# Patient Record
Sex: Female | Born: 1960
Health system: Southern US, Community
[De-identification: ages and names within clinical notes are randomized; demographics above are authoritative.]

## PROBLEM LIST (undated history)

## (undated) DIAGNOSIS — E785 Hyperlipidemia, unspecified: Secondary | ICD-10-CM

## (undated) DIAGNOSIS — D649 Anemia, unspecified: Secondary | ICD-10-CM

## (undated) DIAGNOSIS — N200 Calculus of kidney: Secondary | ICD-10-CM

## (undated) DIAGNOSIS — I10 Essential (primary) hypertension: Secondary | ICD-10-CM

## (undated) DIAGNOSIS — K219 Gastro-esophageal reflux disease without esophagitis: Secondary | ICD-10-CM

## (undated) DIAGNOSIS — J45909 Unspecified asthma, uncomplicated: Secondary | ICD-10-CM

## (undated) HISTORY — PX: TONSILLECTOMY: SUR1361

## (undated) HISTORY — PX: ECTOPIC PREGNANCY SURGERY: SHX613

## (undated) HISTORY — PX: TUBAL LIGATION: SHX77

## (undated) HISTORY — DX: Essential (primary) hypertension: I10

---

## 1997-09-14 ENCOUNTER — Encounter: Admission: RE | Admit: 1997-09-14 | Discharge: 1997-09-14 | Payer: Self-pay | Admitting: Internal Medicine

## 1997-09-20 ENCOUNTER — Encounter: Admission: RE | Admit: 1997-09-20 | Discharge: 1997-12-19 | Payer: Self-pay | Admitting: *Deleted

## 1997-12-16 ENCOUNTER — Encounter: Admission: RE | Admit: 1997-12-16 | Discharge: 1997-12-16 | Payer: Self-pay | Admitting: Internal Medicine

## 1998-03-09 ENCOUNTER — Encounter: Admission: RE | Admit: 1998-03-09 | Discharge: 1998-03-09 | Payer: Self-pay | Admitting: Internal Medicine

## 1998-04-13 ENCOUNTER — Encounter: Admission: RE | Admit: 1998-04-13 | Discharge: 1998-04-13 | Payer: Self-pay | Admitting: Hematology and Oncology

## 1998-11-10 ENCOUNTER — Other Ambulatory Visit: Admission: RE | Admit: 1998-11-10 | Discharge: 1998-11-10 | Payer: Self-pay | Admitting: *Deleted

## 1998-12-05 ENCOUNTER — Emergency Department (HOSPITAL_COMMUNITY): Admission: EM | Admit: 1998-12-05 | Discharge: 1998-12-05 | Payer: Self-pay | Admitting: Emergency Medicine

## 1999-04-30 ENCOUNTER — Encounter: Admission: RE | Admit: 1999-04-30 | Discharge: 1999-04-30 | Payer: Self-pay | Admitting: Hematology and Oncology

## 1999-07-17 ENCOUNTER — Other Ambulatory Visit: Admission: RE | Admit: 1999-07-17 | Discharge: 1999-07-17 | Payer: Self-pay | Admitting: *Deleted

## 1999-11-13 ENCOUNTER — Other Ambulatory Visit: Admission: RE | Admit: 1999-11-13 | Discharge: 1999-11-13 | Payer: Self-pay | Admitting: *Deleted

## 2000-01-04 ENCOUNTER — Encounter: Admission: RE | Admit: 2000-01-04 | Discharge: 2000-01-04 | Payer: Self-pay | Admitting: *Deleted

## 2000-01-04 ENCOUNTER — Encounter: Payer: Self-pay | Admitting: *Deleted

## 2000-11-17 ENCOUNTER — Other Ambulatory Visit: Admission: RE | Admit: 2000-11-17 | Discharge: 2000-11-17 | Payer: Self-pay | Admitting: *Deleted

## 2001-01-05 ENCOUNTER — Encounter: Payer: Self-pay | Admitting: *Deleted

## 2001-01-05 ENCOUNTER — Encounter: Admission: RE | Admit: 2001-01-05 | Discharge: 2001-01-05 | Payer: Self-pay | Admitting: *Deleted

## 2001-09-09 ENCOUNTER — Encounter: Payer: Self-pay | Admitting: Occupational Medicine

## 2001-09-09 ENCOUNTER — Encounter: Admission: RE | Admit: 2001-09-09 | Discharge: 2001-09-09 | Payer: Self-pay | Admitting: Occupational Medicine

## 2001-09-28 ENCOUNTER — Encounter: Admission: RE | Admit: 2001-09-28 | Discharge: 2001-12-27 | Payer: Self-pay | Admitting: Occupational Medicine

## 2002-01-12 ENCOUNTER — Encounter: Admission: RE | Admit: 2002-01-12 | Discharge: 2002-01-12 | Payer: Self-pay | Admitting: Family Medicine

## 2002-01-12 ENCOUNTER — Encounter: Payer: Self-pay | Admitting: Family Medicine

## 2003-01-14 ENCOUNTER — Encounter: Payer: Self-pay | Admitting: Family Medicine

## 2003-01-14 ENCOUNTER — Encounter: Admission: RE | Admit: 2003-01-14 | Discharge: 2003-01-14 | Payer: Self-pay | Admitting: Family Medicine

## 2004-01-16 ENCOUNTER — Encounter: Admission: RE | Admit: 2004-01-16 | Discharge: 2004-01-16 | Payer: Self-pay | Admitting: Obstetrics and Gynecology

## 2005-01-16 ENCOUNTER — Encounter: Admission: RE | Admit: 2005-01-16 | Discharge: 2005-01-16 | Payer: Self-pay | Admitting: Obstetrics and Gynecology

## 2005-08-27 ENCOUNTER — Emergency Department (HOSPITAL_COMMUNITY): Admission: EM | Admit: 2005-08-27 | Discharge: 2005-08-27 | Payer: Self-pay | Admitting: Emergency Medicine

## 2006-01-20 ENCOUNTER — Encounter: Admission: RE | Admit: 2006-01-20 | Discharge: 2006-01-20 | Payer: Self-pay | Admitting: Obstetrics and Gynecology

## 2007-01-22 ENCOUNTER — Encounter: Admission: RE | Admit: 2007-01-22 | Discharge: 2007-01-22 | Payer: Self-pay | Admitting: Family Medicine

## 2007-02-15 ENCOUNTER — Emergency Department (HOSPITAL_COMMUNITY): Admission: EM | Admit: 2007-02-15 | Discharge: 2007-02-15 | Payer: Self-pay | Admitting: Family Medicine

## 2007-05-08 ENCOUNTER — Emergency Department (HOSPITAL_COMMUNITY): Admission: EM | Admit: 2007-05-08 | Discharge: 2007-05-08 | Payer: Self-pay | Admitting: Family Medicine

## 2007-05-08 ENCOUNTER — Ambulatory Visit (HOSPITAL_COMMUNITY): Admission: RE | Admit: 2007-05-08 | Discharge: 2007-05-08 | Payer: Self-pay | Admitting: Emergency Medicine

## 2007-08-01 ENCOUNTER — Emergency Department (HOSPITAL_COMMUNITY): Admission: EM | Admit: 2007-08-01 | Discharge: 2007-08-01 | Payer: Self-pay | Admitting: Family Medicine

## 2007-10-04 ENCOUNTER — Emergency Department (HOSPITAL_COMMUNITY): Admission: EM | Admit: 2007-10-04 | Discharge: 2007-10-04 | Payer: Self-pay | Admitting: Family Medicine

## 2007-11-15 ENCOUNTER — Emergency Department (HOSPITAL_COMMUNITY): Admission: EM | Admit: 2007-11-15 | Discharge: 2007-11-15 | Payer: Self-pay | Admitting: Family Medicine

## 2008-01-25 ENCOUNTER — Encounter: Admission: RE | Admit: 2008-01-25 | Discharge: 2008-01-25 | Payer: Self-pay | Admitting: Family Medicine

## 2008-04-05 ENCOUNTER — Emergency Department (HOSPITAL_COMMUNITY): Admission: EM | Admit: 2008-04-05 | Discharge: 2008-04-05 | Payer: Self-pay | Admitting: Emergency Medicine

## 2008-11-17 ENCOUNTER — Emergency Department (HOSPITAL_COMMUNITY): Admission: EM | Admit: 2008-11-17 | Discharge: 2008-11-17 | Payer: Self-pay | Admitting: Family Medicine

## 2009-01-30 ENCOUNTER — Encounter: Admission: RE | Admit: 2009-01-30 | Discharge: 2009-01-30 | Payer: Self-pay | Admitting: Family Medicine

## 2009-09-02 ENCOUNTER — Emergency Department (HOSPITAL_COMMUNITY): Admission: EM | Admit: 2009-09-02 | Discharge: 2009-09-02 | Payer: Self-pay | Admitting: Family Medicine

## 2010-01-07 ENCOUNTER — Emergency Department (HOSPITAL_COMMUNITY): Admission: EM | Admit: 2010-01-07 | Discharge: 2010-01-07 | Payer: Self-pay | Admitting: Family Medicine

## 2010-03-28 ENCOUNTER — Encounter: Admission: RE | Admit: 2010-03-28 | Discharge: 2010-03-28 | Payer: Self-pay | Admitting: Family Medicine

## 2010-09-17 LAB — POCT I-STAT, CHEM 8
Chloride: 106 mEq/L (ref 96–112)
HCT: 40 % (ref 36.0–46.0)
Hemoglobin: 13.6 g/dL (ref 12.0–15.0)
Potassium: 3.8 mEq/L (ref 3.5–5.1)
Sodium: 142 mEq/L (ref 135–145)

## 2011-03-05 LAB — POCT I-STAT, CHEM 8
BUN: 14
Creatinine, Ser: 0.9
Hemoglobin: 13.6
Sodium: 142

## 2011-03-08 ENCOUNTER — Emergency Department (HOSPITAL_COMMUNITY): Payer: 59

## 2011-03-08 ENCOUNTER — Emergency Department (HOSPITAL_COMMUNITY)
Admission: EM | Admit: 2011-03-08 | Discharge: 2011-03-08 | Disposition: A | Discharge status: Home / Self Care | Payer: 59 | Attending: Emergency Medicine | Admitting: Emergency Medicine

## 2011-03-08 DIAGNOSIS — L03119 Cellulitis of unspecified part of limb: Secondary | ICD-10-CM | POA: Insufficient documentation

## 2011-03-08 DIAGNOSIS — M7989 Other specified soft tissue disorders: Secondary | ICD-10-CM | POA: Insufficient documentation

## 2011-03-08 DIAGNOSIS — L02419 Cutaneous abscess of limb, unspecified: Secondary | ICD-10-CM | POA: Insufficient documentation

## 2011-03-08 DIAGNOSIS — E7800 Pure hypercholesterolemia, unspecified: Secondary | ICD-10-CM | POA: Insufficient documentation

## 2011-03-08 DIAGNOSIS — E119 Type 2 diabetes mellitus without complications: Secondary | ICD-10-CM | POA: Insufficient documentation

## 2011-03-19 LAB — DIFFERENTIAL
Basophils Absolute: 0
Eosinophils Absolute: 0.4
Lymphocytes Relative: 22

## 2011-03-19 LAB — POCT I-STAT CREATININE
Creatinine, Ser: 0.7
Operator id: 126491

## 2011-03-19 LAB — CBC
Hemoglobin: 13.3
MCV: 88
Platelets: 257
WBC: 12 — ABNORMAL HIGH

## 2011-03-19 LAB — I-STAT 8, (EC8 V) (CONVERTED LAB)
Bicarbonate: 23.2
Chloride: 106
Glucose, Bld: 138 — ABNORMAL HIGH
Hemoglobin: 14.6
Operator id: 126491
Potassium: 3.9
Sodium: 141
pCO2, Ven: 40.3 — ABNORMAL LOW
pH, Ven: 7.367 — ABNORMAL HIGH

## 2011-04-16 ENCOUNTER — Other Ambulatory Visit: Payer: Self-pay | Admitting: Family Medicine

## 2011-04-16 DIAGNOSIS — Z1231 Encounter for screening mammogram for malignant neoplasm of breast: Secondary | ICD-10-CM

## 2011-05-03 ENCOUNTER — Ambulatory Visit
Admission: RE | Admit: 2011-05-03 | Discharge: 2011-05-03 | Disposition: A | Discharge status: Home / Self Care | Payer: 59 | Source: Ambulatory Visit | Attending: Family Medicine | Admitting: Family Medicine

## 2011-05-03 DIAGNOSIS — Z1231 Encounter for screening mammogram for malignant neoplasm of breast: Secondary | ICD-10-CM

## 2011-09-23 ENCOUNTER — Encounter (HOSPITAL_COMMUNITY): Payer: Self-pay

## 2011-09-23 ENCOUNTER — Encounter (HOSPITAL_COMMUNITY)
Admission: RE | Disposition: A | Discharge status: Hospice | Payer: Self-pay | Source: Ambulatory Visit | Attending: Gastroenterology

## 2011-09-23 ENCOUNTER — Ambulatory Visit (HOSPITAL_COMMUNITY)
Admission: RE | Admit: 2011-09-23 | Discharge: 2011-09-23 | Disposition: A | Discharge status: Hospice | Payer: 59 | Source: Ambulatory Visit | Attending: Gastroenterology | Admitting: Gastroenterology

## 2011-09-23 DIAGNOSIS — Z1211 Encounter for screening for malignant neoplasm of colon: Secondary | ICD-10-CM | POA: Insufficient documentation

## 2011-09-23 DIAGNOSIS — E119 Type 2 diabetes mellitus without complications: Secondary | ICD-10-CM | POA: Insufficient documentation

## 2011-09-23 DIAGNOSIS — K5900 Constipation, unspecified: Secondary | ICD-10-CM | POA: Insufficient documentation

## 2011-09-23 DIAGNOSIS — K620 Anal polyp: Secondary | ICD-10-CM | POA: Insufficient documentation

## 2011-09-23 DIAGNOSIS — K219 Gastro-esophageal reflux disease without esophagitis: Secondary | ICD-10-CM | POA: Insufficient documentation

## 2011-09-23 DIAGNOSIS — R12 Heartburn: Secondary | ICD-10-CM | POA: Insufficient documentation

## 2011-09-23 DIAGNOSIS — E785 Hyperlipidemia, unspecified: Secondary | ICD-10-CM | POA: Insufficient documentation

## 2011-09-23 HISTORY — DX: Gastro-esophageal reflux disease without esophagitis: K21.9

## 2011-09-23 HISTORY — PX: COLONOSCOPY: SHX5424

## 2011-09-23 HISTORY — DX: Hyperlipidemia, unspecified: E78.5

## 2011-09-23 SURGERY — COLONOSCOPY
Anesthesia: Moderate Sedation

## 2011-09-23 MED ORDER — FENTANYL CITRATE 0.05 MG/ML IJ SOLN
INTRAMUSCULAR | Status: DC | PRN
  Administered 2011-09-23 (×3): 25 ug via INTRAVENOUS

## 2011-09-23 MED ORDER — DIPHENHYDRAMINE HCL 50 MG/ML IJ SOLN
INTRAMUSCULAR | Status: AC
  Filled 2011-09-23: qty 1

## 2011-09-23 MED ORDER — MIDAZOLAM HCL 10 MG/2ML IJ SOLN
INTRAMUSCULAR | Status: DC | PRN
  Administered 2011-09-23 (×3): 2 mg via INTRAVENOUS

## 2011-09-23 MED ORDER — MIDAZOLAM HCL 10 MG/2ML IJ SOLN
INTRAMUSCULAR | Status: AC
  Filled 2011-09-23: qty 2

## 2011-09-23 MED ORDER — FENTANYL CITRATE 0.05 MG/ML IJ SOLN
INTRAMUSCULAR | Status: AC
  Filled 2011-09-23: qty 2

## 2011-09-23 NOTE — Op Note (Signed)
[facility] [address] [place], [place]  [address]  OPERATIVE PROCEDURE REPORT  PATIENT:  Brandy Burns, Brandy Burns  MR#:  [id number] BIRTHDATE:  Jan 11, 1961  GENDER:  female ENDOSCOPIST:  Dr. Lorenza Burton, MD ASSISTANT:  Anthony Sar, RN and Windell Hummingbird, technician.  PROCEDURE DATE:  09/23/2011 PRE-PROCEDURE PREPERATION:  Moviprep was taken as instructed (32 ounces the night prior to the procedure and 32 ounces the morning of the procedure).  The patient was fasted for four hours prior to the procedure. PRE-PROCEDURE PHYSICAL:  Patient has stable vital signs. Neck is supple. There is no JVD, thyromegaly or LAD. Chest clear to auscultation. S1 and S2 regular. Abdomen soft, non-distended, non-tender with NABS. PROCEDURE:  Colonoscopy with multiple cold biopsies. ASA CLASS:  Class IV INDICATIONS:  1) Colorectal cancer screening, average risk 2) Constipation. MEDICATIONS:  Fentanyl 75 mcg &, Versed 6 mg IV.  DESCRIPTION OF PROCEDURE: After the risks, benefits, and alternatives of the procedure were thoroughly explained [including a 10% missed rate of cancer and polyps], informed consent was obtained.  Digital rectal exam was performed.  The Pentax video Colonoscope [id number] was introduced through the anus and advanced to the terminal ileum which was intubated for a short distance, without limitations.  The quality of the prep was good.. Multiple washes were done. Small lesions could be missed. The instrument was then slowly withdrawn as the colon was fully examined. <<PROCEDUREIMAGES>>  FINDINGS:  There were 4 small sessile rectal polyps that were removed by cold biosies x 4. The restof the entire colonic mucosa appeared healthy with a normal vascular pattern. No masses, diverticula or AVM's were noted. The appendiceal orifice and the ICV were identified and photographed.  The terminal ileum appeared normal. Retroflexed views revealed no abnormalities. The  patient tolerated the procedure without immediate complications. The scope was then withdrawn from the patient and the procedure terminated.  IMPRESSION:  Four small sessile rectal polyps removed by cold biopsies x 4; otherwse, normal colonoscopy up to the terminal ileum.  RECOMMENDATIONS:  1) Avoid all NSAIDS for the next 2 weeks. 2) Await pathology results. 3) Continue surveillance. 4) High fiber diet with liberal fluid intake. 5) OP follow-up is advised on a PRN basis.  REPEAT EXAM:   In 5-10 years, depending on the pathology results; in case the patient has any abnormal GI symptoms in the interim, she should contact the office immediately for further recommendations.  DISCHARGE INSTRUCTIONS:  Standard discharge instructions given.  ______________________________ Dr. Lorenza Burton, MD  CPT CODES: 69629  DIAGNOSIS CODES:  211.3, 564.00, V76.51  CC:  Carrington Clamp, M.D. Milus Height, P.A.  n. eSIGNED:   Dr. Lorenza Burton at 09/23/2011 03:10 PM  Dreama Saa, [phone]

## 2011-09-23 NOTE — Discharge Instructions (Addendum)
Colonoscopy Care After Read the instructions outlined below and refer to this sheet in the next few weeks. These discharge instructions provide you with general information on caring for yourself after you leave the hospital. Your doctor may also give you specific instructions. While your treatment has been planned according to the most current medical practices available, unavoidable complications occasionally occur. If you have any problems or questions after discharge, call your doctor. HOME CARE INSTRUCTIONS ACTIVITY:  You may resume your regular activity, but move at a slower pace for the next 24 hours.   Take frequent rest periods for the next 24 hours.   Walking will help get rid of the air and reduce the bloated feeling in your belly (abdomen).   No driving for 24 hours (because of the medicine (anesthesia) used during the test).   You may shower.   Do not sign any important legal documents or operate any machinery for 24 hours (because of the anesthesia used during the test).  NUTRITION:  Drink plenty of fluids.   You may resume your normal diet as instructed by your doctor.   Begin with a light meal and progress to your normal diet. Heavy or fried foods are harder to digest and may make you feel sick to your stomach (nauseated).   Avoid alcoholic beverages for 24 hours or as instructed.  MEDICATIONS:  You may resume your normal medications unless your doctor tells you otherwise.  WHAT TO EXPECT TODAY:  Some feelings of bloating in the abdomen.   Passage of more gas than usual.   Spotting of blood in your stool or on the toilet paper.  IF YOU HAD POLYPS REMOVED DURING THE COLONOSCOPY:  No aspirin products for 7 days or as instructed.   No alcohol for 7 days or as instructed.   Eat a soft diet for the next 24 hours.  FINDING OUT THE RESULTS OF YOUR TEST Not all test results are available during your visit. If your test results are not back during the visit, make an  appointment with your caregiver to find out the results. Do not assume everything is normal if you have not heard from your caregiver or the medical facility. It is important for you to follow up on all of your test results.  SEEK IMMEDIATE MEDICAL CARE IF:  You have more than a spotting of blood in your stool.   Your belly is swollen (abdominal distention).   You are nauseated or vomiting.   You have a fever.   You have abdominal pain or discomfort that is severe or gets worse throughout the day.  Document Released: 01/09/2004 Document Revised: 05/16/2011 Document Reviewed: 01/07/2008 ExitCare Patient Information 2012 [employer]. 

## 2011-09-23 NOTE — Consult Note (Signed)
Reason for Consult: Colorectal cancer screening. Referring Physician: Carrington Clamp, MD  Brandy Burns is an 51 y.o. female.  HPI: Patient is here for a  Screening colonoscopy. She is on Nexium for reflux.  Past Medical History  Diagnosis Date  . Diabetes mellitus   . GERD (gastroesophageal reflux disease)   . Hyperlipemia     Past Surgical History  Procedure Date  . Tonsillectomy   . Tubal ligation   . Ectopic pregnancy surgery     No family history on file.  Social History:  reports that she has been smoking.  She does not have any smokeless tobacco history on file. Her alcohol and drug histories not on file.  Allergies:  Allergies  Allergen Reactions  . Sulfa Antibiotics Hives    Medications: I have reviewed the patient's current medications.  Results for orders placed during the hospital encounter of 09/23/11 (from the past 48 hour(s))  GLUCOSE, CAPILLARY     Status: Abnormal   Collection Time   09/23/11  1:42 PM      Component Value Range Comment   Glucose-Capillary 118 (*) 70 - 99 (mg/dL)    Review of Systems  Constitutional: Negative.   HENT: Negative.  Negative for neck pain.   Eyes: Negative.   Cardiovascular: Negative.   Gastrointestinal: Positive for heartburn and constipation. Negative for nausea, vomiting, abdominal pain, diarrhea, blood in stool and melena.  Musculoskeletal: Negative for myalgias, back pain and joint pain.  Skin: Negative.   Neurological: Negative.   Psychiatric/Behavioral: Negative.    Blood pressure 96/74, temperature 98.2 F (36.8 C), temperature source Oral, resp. rate 12, height 5\' 4"  (1.626 m), weight 88.905 kg (196 lb), SpO2 96.00%. Physical Exam  Constitutional: She is oriented to person, place, and time. She appears well-developed and well-nourished.  HENT:  Head: Normocephalic and atraumatic.  Eyes: Conjunctivae and EOM are normal. Pupils are equal, round, and reactive to light.  Neck: Normal range of motion. Neck  supple.  Cardiovascular: Normal rate and regular rhythm.   Respiratory: Effort normal and breath sounds normal.  GI: Soft. Bowel sounds are normal.  Neurological: She is alert and oriented to person, place, and time. She has normal reflexes.  Skin: Skin is warm and dry.  Psychiatric: She has a normal mood and affect. Her behavior is normal. Judgment and thought content normal.    Assessment/Plan: 1) Colorectal cancer screening; Proceed with a colonoscopy at this time. 2) Acid reflux : On Nexium.  Mando Blatz 09/23/2011, 2:32 PM

## 2011-09-24 ENCOUNTER — Encounter (HOSPITAL_COMMUNITY): Payer: Self-pay | Admitting: Gastroenterology

## 2012-03-04 ENCOUNTER — Encounter (HOSPITAL_COMMUNITY): Payer: Self-pay | Admitting: Emergency Medicine

## 2012-03-04 ENCOUNTER — Emergency Department (HOSPITAL_COMMUNITY)
Admission: EM | Admit: 2012-03-04 | Discharge: 2012-03-04 | Disposition: A | Discharge status: Home / Self Care | Payer: 59 | Source: Home / Self Care | Attending: Family Medicine | Admitting: Family Medicine

## 2012-03-04 ENCOUNTER — Emergency Department (INDEPENDENT_AMBULATORY_CARE_PROVIDER_SITE_OTHER): Payer: 59

## 2012-03-04 DIAGNOSIS — M545 Low back pain, unspecified: Secondary | ICD-10-CM

## 2012-03-04 LAB — POCT URINALYSIS DIP (DEVICE)
Nitrite: NEGATIVE
Urobilinogen, UA: 0.2 mg/dL (ref 0.0–1.0)
pH: 6.5 (ref 5.0–8.0)

## 2012-03-04 LAB — POCT I-STAT, CHEM 8
Calcium, Ion: 1.19 mmol/L (ref 1.12–1.23)
Chloride: 107 mEq/L (ref 96–112)
HCT: 38 % (ref 36.0–46.0)
Hemoglobin: 12.9 g/dL (ref 12.0–15.0)
Potassium: 3.6 mEq/L (ref 3.5–5.1)

## 2012-03-04 LAB — URINALYSIS, ROUTINE W REFLEX MICROSCOPIC
Bilirubin Urine: NEGATIVE
Glucose, UA: NEGATIVE mg/dL
Hgb urine dipstick: NEGATIVE
Protein, ur: NEGATIVE mg/dL
Urobilinogen, UA: 1 mg/dL (ref 0.0–1.0)

## 2012-03-04 MED ORDER — ACETAMINOPHEN 325 MG PO TABS
ORAL_TABLET | ORAL | Status: AC
  Filled 2012-03-04: qty 2

## 2012-03-04 MED ORDER — ACETAMINOPHEN 325 MG PO TABS
650.0000 mg | ORAL_TABLET | Freq: Once | ORAL | Status: AC
  Administered 2012-03-04: 650 mg via ORAL

## 2012-03-04 MED ORDER — CYCLOBENZAPRINE HCL 10 MG PO TABS: 10.0000 mg | ORAL_TABLET | Freq: Two times a day (BID) | ORAL | Status: DC | PRN

## 2012-03-04 MED ORDER — NAPROXEN 500 MG PO TABS: 500.0000 mg | ORAL_TABLET | Freq: Two times a day (BID) | ORAL | Status: DC

## 2012-03-04 MED ORDER — TRAMADOL HCL 50 MG PO TABS: 50.0000 mg | ORAL_TABLET | Freq: Four times a day (QID) | ORAL | Status: DC | PRN

## 2012-03-04 NOTE — ED Notes (Signed)
Patient changed clothing

## 2012-03-04 NOTE — ED Provider Notes (Signed)
History     CSN: [id number]  Arrival date & time 03/04/12  4098   First MD Initiated Contact with Patient 03/04/12 [phone]      Chief Complaint  Patient presents with  . Back Pain    (Consider location/radiation/quality/duration/timing/severity/associated sxs/prior treatment) HPI Comments: 51 year old diabetic female. Comes complaining of 3 weeks with low bilateral back pain. Reports her symptoms have been worse during the last 3 days more on the right side with radiation to the right groin area. Denies painful urination or blood in the urine. Reports pain is worse with bending forward. Denies radiation to the legs. Denies numbness or paresthesias in the lower extremities. Denies vaginal discharge or vaginal bleeding. No headache, dizziness, fever or chills. No abdominal pain nausea vomiting or diarrhea. No recent falls or known injury. 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 Denies heavy lifting.   Past Medical History  Diagnosis Date  . Diabetes mellitus   . GERD (gastroesophageal reflux disease)   . Hyperlipemia     Past Surgical History  Procedure Date  . Tonsillectomy   . Tubal ligation   . Ectopic pregnancy surgery   . Colonoscopy 09/23/2011    Procedure: COLONOSCOPY;  Surgeon: Charna Elizabeth, MD;  Location: [facility];  Service: Endoscopy;  Laterality: N/A;    No family history on file.  History  Substance Use Topics  . Smoking status: Current Every Day Smoker -- 0.5 packs/day for 0 years  . Smokeless tobacco: Not on file  . Alcohol Use: No    OB History    Grav Para Term Preterm Abortions TAB SAB Ect Mult Living                  Review of Systems  Constitutional:       10 systems reviewed and  pertinent negative and positive symptoms are as per HPI.     Musculoskeletal: Positive for back pain.  All other systems reviewed and are negative.    Allergies  Sulfa antibiotics  Home Medications    Current Outpatient Rx  Name Route Sig Dispense Refill  . CYCLOBENZAPRINE HCL 10 MG PO TABS Oral Take 1 tablet (10 mg total) by mouth 2 (two) times daily as needed for muscle spasms. 20 tablet 0  . ESOMEPRAZOLE MAGNESIUM 40 MG PO CPDR Oral Take 40 mg by mouth daily before breakfast.    . GLIMEPIRIDE 2 MG PO TABS Oral Take 2 mg by mouth 2 (two) times daily.    Marland Kitchen LISINOPRIL 10 MG PO TABS Oral Take 10 mg by mouth daily.    Marland Kitchen METFORMIN HCL 1000 MG PO TABS Oral Take 1,000 mg by mouth 2 (two) times daily with a meal.    . NAPROXEN 500 MG PO TABS Oral Take 1 tablet (500 mg total) by mouth 2 (two) times daily. 20 tablet 0  . SIMVASTATIN 40 MG PO TABS Oral Take 40 mg by mouth every evening.    Marland Kitchen SITAGLIPTIN PHOSPHATE 100 MG PO TABS Oral Take 100 mg by mouth daily.    . TRAMADOL HCL 50 MG PO TABS Oral Take 1 tablet (50 mg total) by mouth every 6 (six) hours as needed for pain. 15 tablet 0    BP 121/77  Pulse 80  Temp 97.8 F (36.6 C) (Oral)  Resp 18  SpO2 97%  Physical Exam  Nursing note and vitals reviewed. Constitutional: She is oriented to person, place, and time. She appears  well-developed and well-nourished. No distress.  HENT:  Head: Normocephalic and atraumatic.  Mouth/Throat: Oropharynx is clear and moist.  Eyes: Conjunctivae normal are normal. Pupils are equal, round, and reactive to light. No scleral icterus.  Neck: Neck supple. No JVD present. No thyromegaly present.  Cardiovascular: Normal rate, regular rhythm and normal heart sounds.  Exam reveals no gallop and no friction rub.   No murmur heard. Pulmonary/Chest: Effort normal and breath sounds normal. No respiratory distress. She has no wheezes. She has no rales. She exhibits no tenderness.  Abdominal: Soft. Bowel sounds are normal. She exhibits no distension. There is no tenderness.       No CVT. Reported tenderness to palpation in right flank. No guarding or rebound .  Musculoskeletal:       Spine central: No obvious  scoliosis or kyphosis. No pain over bone processes in entire spine.  Increased tone and lumbar paravertebral tenderness to palpation  On the right side.  Negative rise straight leg test.  Normal symmetric DTR in knees and ankles.  Right Hip: normal flexion and extension as well as fair internal and external rotation.   Lymphadenopathy:    She has no cervical adenopathy.  Neurological: She is alert and oriented to person, place, and time.  Skin: No rash noted.    ED Course  Procedures (including critical care time)  Labs Reviewed  POCT URINALYSIS DIP (DEVICE) - Abnormal; Notable for the following:    Hgb urine dipstick TRACE (*)     All other components within normal limits  POCT I-STAT, CHEM 8 - Abnormal; Notable for the following:    Glucose, Bld 134 (*)     All other components within normal limits  URINALYSIS, ROUTINE W REFLEX MICROSCOPIC  URINE CULTURE   Dg Abd 1 View  03/04/2012  *RADIOLOGY REPORT*  Clinical Data: Lower abdominal pain, back pain.  ABDOMEN - 1 VIEW  Comparison: None.  Findings: Normal bowel gas pattern.  No supine evidence of free air.  No organomegaly or suspicious calcification.  No acute bony abnormality.  IMPRESSION: No acute findings.   Original Report Authenticated By: Cyndie Chime, M.D.      1. Low back pain       MDM  Normal microscopic urinalysis. Normal KUB. Normal electrolytes. Impress low back pain of musculoskeletal origin. No symptoms or findings suggestive of nerve impingement. Prescribed naproxen, Flexeril and tramadol. Encouraged to start back stretching exercises as soon as pain improves. Asked to followup with primary care provider or return to medical attention if persistent or worsening symptoms despite following treatment.        Sharin Grave, MD 03/07/12 1610

## 2012-03-04 NOTE — ED Notes (Signed)
Sent to bathroom for specimen. Instructed to place on gown when returning to treatment room

## 2012-03-04 NOTE — ED Notes (Signed)
Back pain started 3 weeks.  Denies history of back pain.  Reports pain when bending over limits ability to bend.  Low back pain radiating around pelvis to the groin.  Denies burning with urination.  Denies vaginal discharge.  Denies fever.  No known injury

## 2012-03-05 LAB — URINE CULTURE
Colony Count: 15000
Special Requests: NORMAL

## 2012-03-19 ENCOUNTER — Ambulatory Visit (HOSPITAL_COMMUNITY)
Admission: RE | Admit: 2012-03-19 | Discharge: 2012-03-19 | Disposition: A | Discharge status: Home / Self Care | Payer: 59 | Source: Ambulatory Visit | Attending: Family Medicine | Admitting: Family Medicine

## 2012-03-19 ENCOUNTER — Other Ambulatory Visit (HOSPITAL_COMMUNITY): Payer: Self-pay | Admitting: Family Medicine

## 2012-03-19 DIAGNOSIS — K7689 Other specified diseases of liver: Secondary | ICD-10-CM | POA: Insufficient documentation

## 2012-03-19 DIAGNOSIS — M549 Dorsalgia, unspecified: Secondary | ICD-10-CM | POA: Insufficient documentation

## 2012-03-19 DIAGNOSIS — M545 Low back pain: Secondary | ICD-10-CM

## 2012-03-19 DIAGNOSIS — X58XXXA Exposure to other specified factors, initial encounter: Secondary | ICD-10-CM | POA: Insufficient documentation

## 2012-03-19 DIAGNOSIS — R109 Unspecified abdominal pain: Secondary | ICD-10-CM | POA: Insufficient documentation

## 2012-03-19 DIAGNOSIS — N83209 Unspecified ovarian cyst, unspecified side: Secondary | ICD-10-CM | POA: Insufficient documentation

## 2012-03-19 MED ORDER — IOHEXOL 300 MG/ML  SOLN: 100.0000 mL | Freq: Once | INTRAMUSCULAR | Status: AC | PRN

## 2012-07-25 ENCOUNTER — Other Ambulatory Visit: Payer: Self-pay

## 2012-07-30 ENCOUNTER — Other Ambulatory Visit: Payer: Self-pay | Admitting: Obstetrics and Gynecology

## 2012-09-12 ENCOUNTER — Encounter: Payer: Self-pay | Admitting: Dietician

## 2012-09-12 ENCOUNTER — Encounter: Payer: 59 | Attending: Physician Assistant | Admitting: Dietician

## 2012-09-12 VITALS — Ht 65.0 in | Wt 188.2 lb

## 2012-09-12 DIAGNOSIS — Z713 Dietary counseling and surveillance: Secondary | ICD-10-CM | POA: Insufficient documentation

## 2012-09-12 DIAGNOSIS — E119 Type 2 diabetes mellitus without complications: Secondary | ICD-10-CM | POA: Insufficient documentation

## 2012-09-12 NOTE — Progress Notes (Signed)
Patient was seen on 09/12/2012 for the complete series of three diabetes self-management courses at the [facility]. Current A1c = 7.9% on 08/11/2012  The following learning objectives were met by the patient during this course: Core 1:  Gain an understanding of diabetes and what causes it  Learn how diabetes is treated and the goals of treatment  Learn why, how and when to test BG  Learn how carbohydrate affects your glucose  Receive a personal food plan and learn how to count carbohydrates  Discover how physical activity enhances glucose control and overall health  Begin to gain confidence in your ability to manage diabetes  Handouts given during this class include:  Type 2 Diabetes: Basics Book  My Food Plan Book  Food and Activity Log  Core 2:  Describe causes, symptoms and treatment of hypoglycemia and hyperglycemia  Learn how to care for your glucose meter and strips  Explain how to manage diabetes during illness  List strategies to follow meal plan when dining out  Describe the effects of alcohol on glucose and how to use it safely  Describe problem solving skills for day-to-day glucose challenges  Describe ways to remain physically active  Describe the impact of regular activity on insulin resistance  Handouts given in this class:  Refrigerator magnet for Sick Day Guidelines  [facility] Oral Medication and Insulin handout  Core 3  Describe how diabetes changes over time  Understand why glucose may be out of target  Learn how diabetes changes over time  Learn about blood pressure, cholesterol, and heart health  Learn about lowering dietary fat and sodium  Understand the benefits of physical activity for heart health  Develop problem solving skills for times when glucose numbers are puzzling  Develop strategies for creating life balance  Learn how to identify if your treatment plan needs to change  Develop strategies  for dealing with stress, depression and staying motivated  Identify healthy weight-loss plans  Gain confidence that you can succeed in caring for your diabetes  Establish 2-3 goals that they will plan to diligently work on until they return                   for the free 48-month follow-up visit   The following handouts were given in class:  3 Month Follow Up Visit handout  Goal setting handout  Class evaluation form  Your patient has established the following 3 month goals for diabetes self-care:  Reduce fat in my diet by eating less sweets  Increase my activity at least 3 days a week  Test my glucose at least 2 times a day, 4 days a week  Follow-Up Plan: Patient was offered a 3 month follow-up visit for diabetes self-management education.

## 2012-09-12 NOTE — Patient Instructions (Signed)
Goals:  Follow Diabetes Meal Plan as instructed  Eat 3 meals and 2 snacks, every 3-5 hrs  Limit carbohydrate intake to 30-45 grams carbohydrate/meal  Limit carbohydrate intake to 15 grams carbohydrate/snack  Add lean protein foods to meals/snacks  Monitor glucose levels as instructed by your doctor  Aim for 30 mins of physical activity daily  Bring food record and glucose log to your next nutrition visit 

## 2012-12-17 ENCOUNTER — Ambulatory Visit (INDEPENDENT_AMBULATORY_CARE_PROVIDER_SITE_OTHER): Payer: Self-pay | Admitting: Family Medicine

## 2012-12-17 VITALS — Wt 189.0 lb

## 2012-12-17 DIAGNOSIS — E119 Type 2 diabetes mellitus without complications: Secondary | ICD-10-CM

## 2012-12-17 NOTE — Progress Notes (Signed)
Patient presents to pharmacy for 2 month f/u as part of the employee sponsored Link to [employer]. Medications, glucose readings, and insulin regimen have been reviewed. I have also discussed with patient lifestyle interventions such as diet and exercise. Full documentation of this visit can be found in the [employer] documenting sytem through [employer]. However areas of concern include the following:  1.) patient is not testing blood sugar that much, especially the past 3 weeks. Counseled on importance of checking blood sugar particulary while on insulin. Goal fasting 70-130. Told patient to call if frequently seeing numbers above 130 and I would contact the MD 2.) f/u in September for A1C check  Patient has set a series of personal goals and will follow up in 3 months for further review of DM.

## 2012-12-29 NOTE — Progress Notes (Signed)
Patient ID: Brandy Burns, female   DOB: January 24, 1961, 52 y.o.   MRN: [id number] ATTENDING PHYSICIAN NOTE: I have reviewed the chart and agree with the plan as detailed above. Denny Levy MD Pager [phone]

## 2013-01-08 ENCOUNTER — Ambulatory Visit: Payer: 59 | Admitting: *Deleted

## 2013-02-04 ENCOUNTER — Ambulatory Visit: Payer: 59 | Admitting: *Deleted

## 2013-02-11 ENCOUNTER — Encounter: Payer: Self-pay | Admitting: *Deleted

## 2013-02-11 ENCOUNTER — Encounter: Payer: 59 | Attending: Physician Assistant | Admitting: *Deleted

## 2013-02-11 VITALS — Ht 65.0 in | Wt 190.0 lb

## 2013-02-11 DIAGNOSIS — E119 Type 2 diabetes mellitus without complications: Secondary | ICD-10-CM | POA: Insufficient documentation

## 2013-02-11 DIAGNOSIS — Z713 Dietary counseling and surveillance: Secondary | ICD-10-CM | POA: Insufficient documentation

## 2013-02-11 NOTE — Progress Notes (Signed)
  Patient was seen on 02/11/13 for their 3 month follow-up as a part of the diabetes self-management courses at the [facility]. The following learning objectives were met by your patient during this course:  Patient self reports the following:  Last A1c was 7.1% which is improved from 7.9% in April, 2014  Diabetes control has improved since diabetes self-management training: YES, FBG IS AVERAGING BETWEEN 120-130 MG/DL Number of days blood glucose is >200: MAYBE A COUPLE Last MD appointment for diabetes: BEFORE CLASS (EVERY 6 MONTHS) Changes in treatment plan: STARTED ON INSULIN  Confidence with ability to manage diabetes: GETTING BETTER Areas for improvement with diabetes self-care: MORE EXERCISE (BUT IS WORKING 2 JOBS SO TIME IS LIMITED) Willingness to participate in diabetes support group: MAY TRY IF SHE ISN'T WORKING THAT NIGHT.  Your patient has established the following 3 month goals for diabetes self-care:  Reduce fat in my diet by eating less sweets - DOING BETTER, FEELS DEPRIVED IF CANT' HAVE IT. 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  Follow-Up Plan: Patient is eligible for a "free" 30 minute diabetes self-care appointment in the next year. Patient to call and schedule as needed.

## 2013-02-18 ENCOUNTER — Ambulatory Visit (INDEPENDENT_AMBULATORY_CARE_PROVIDER_SITE_OTHER): Payer: 59 | Admitting: Family Medicine

## 2013-02-18 VITALS — BP 119/70 | HR 85 | Wt 194.0 lb

## 2013-02-18 DIAGNOSIS — E119 Type 2 diabetes mellitus without complications: Secondary | ICD-10-CM

## 2013-02-18 NOTE — Progress Notes (Signed)
Patient presents for 3 month follow up DM as part of the employee sponsored Link to [employer]. Medications, glucose readings, and insulin regimen have been reviewed. I have also discussed with patient lifestyle interventions such as diet and exercise. Full documentation of this visit can be found in the [employer] documenting system through [employer]. However specifics of this visit include the following:  POC A1C 6.2, at goal on lantus, januvia, and metformin. Continue current regimen. plan 1.) goal wt by december 180 lb 2.) talked about using myfitnesspal 3. ) patient is smoking 1/2 ppd. I asked her if she wanted to quit and she agreed. smokes around 10 cigarettes per day, some days a couple more.  21 mg patches + 4mg  gum prn (smokes w/i 30 mins waking up). Counseled on proper administration and side effects of each. Provided supplies at no charge.  f/u 1 wk to assess smoking cessation progress. Quit date set for [facility] September 15th.

## 2013-03-18 ENCOUNTER — Other Ambulatory Visit: Payer: Self-pay

## 2013-03-18 DIAGNOSIS — Z1231 Encounter for screening mammogram for malignant neoplasm of breast: Secondary | ICD-10-CM

## 2013-03-29 ENCOUNTER — Ambulatory Visit
Admission: RE | Admit: 2013-03-29 | Discharge: 2013-03-29 | Disposition: A | Discharge status: Home / Self Care | Payer: 59 | Source: Ambulatory Visit

## 2013-03-29 DIAGNOSIS — Z1231 Encounter for screening mammogram for malignant neoplasm of breast: Secondary | ICD-10-CM

## 2013-03-30 ENCOUNTER — Ambulatory Visit: Payer: 59

## 2013-04-15 ENCOUNTER — Other Ambulatory Visit: Payer: Self-pay

## 2013-05-25 ENCOUNTER — Ambulatory Visit (INDEPENDENT_AMBULATORY_CARE_PROVIDER_SITE_OTHER): Payer: Self-pay | Admitting: Family Medicine

## 2013-05-25 VITALS — BP 119/68 | HR 82 | Wt 195.0 lb

## 2013-05-25 DIAGNOSIS — E119 Type 2 diabetes mellitus without complications: Secondary | ICD-10-CM

## 2013-05-25 NOTE — Progress Notes (Signed)
Patient presents for 3 mo f/u DM as part of the employee sponsored Link to [employer]. Medications have been reviewed. I have also discussed with patient lifestyle interventions such as diet and exercise. Full documentation of this visit can be found in the [employer]tracker documenting system through [employer] [facility]). However specifics from this visit include the following:  Diabetes Mellitus:  POC A1C 6.5, at goal. No recommended changes. Patient was given patches and gum to quit smoking at last visit but only used them for a couple weeks. She is under a lot of stress and says she doesn't want to stop smoking at this time. I will see patietn for DM visit in 3 mo but will call her in 1 mo to reassess smoking cessation. Counseled on importance of smoking cessation.  Hyperlipidemia: on moderate intensity statin-no recommended changes  Hypertension: at goal on lisinopril  Patient has set a series of personal goals and will f/u in 3 mo for further review of DM

## 2013-06-01 NOTE — Progress Notes (Signed)
Patient ID: Brandy Burns, female   DOB: 08/01/1960, 52 y.o.   MRN: [id number] ATTENDING PHYSICIAN NOTE: I have reviewed the chart and agree with the plan as detailed above. Arif Amendola MD Pager [phone]  

## 2013-06-30 NOTE — Progress Notes (Signed)
Patient ID: Brandy Burns Billing, female   DOB: 11/13/1960, 52 y.o.   MRN: [id number] ATTENDING PHYSICIAN NOTE: I have reviewed the chart and agree with the plan as detailed above. Ossie Yebra MD Pager [phone]  

## 2013-08-25 ENCOUNTER — Ambulatory Visit (INDEPENDENT_AMBULATORY_CARE_PROVIDER_SITE_OTHER): Payer: 59 | Admitting: Family Medicine

## 2013-08-25 VITALS — BP 104/67 | Wt 197.0 lb

## 2013-08-25 DIAGNOSIS — E119 Type 2 diabetes mellitus without complications: Secondary | ICD-10-CM

## 2013-08-25 NOTE — Progress Notes (Signed)
Patient presents for 3 mo f/u DM as part of the employee sponsored Link to [employer]. Medications have been reviewed. I have also discussed with patient lifestyle interventions such as diet and exercise. Full documentation of this visit can be found in the [employer] documenting system through [facility]). However specifics from this visit include the following:   Diabetes Mellitus: POC A1C 6.8, at goal. No recommended changes. plan 1.) increase walking with the warmer weather. Aim for 30 mins 5 days per week. 2.) filled metformin and lantus which were overdue today 3.) f/u 3 mo  Misc: patient stated she is ready to quit smoking. smokes more than 10 cigarettes, provided her with 21 mg patches and 2 mg gum prn. Quit date set for March 25th. I will call patient on the 24th to remind her of her quit date.  Hyperlipidemia: on appropriately dosed statin  Hypertension: at goal on lisinopril

## 2013-08-26 ENCOUNTER — Other Ambulatory Visit: Payer: Self-pay | Admitting: Obstetrics and Gynecology

## 2013-11-30 ENCOUNTER — Ambulatory Visit (INDEPENDENT_AMBULATORY_CARE_PROVIDER_SITE_OTHER): Payer: Self-pay | Admitting: Family Medicine

## 2013-11-30 DIAGNOSIS — E119 Type 2 diabetes mellitus without complications: Secondary | ICD-10-CM

## 2013-11-30 NOTE — Progress Notes (Signed)
Patient presents to pharmacy for 3 mo f/u DM as part of the employee sponsored [employer] to [employer]. Medications have been reviewed. I have also discussed with patient lifestyle interventions such as diet and exercise. Full documentation of this visit can be found in the caretracker documenting system through [facility]). However specifics from this visit include the following:  POC A1C 7.6, above goal. Patient has gained 6 lb since last visit. Did not bring meter but says she has seen a lot of blood sugars above 130. plan 1.) test blood sugar every morning. Go up on lantus 2 units every 2 days until fasting blood sugar is below 130. 2.) will call patient in 2 weeks to see how she is doing 3.) will do a 6 week meter check to assess if blood sugars are going down 4.) refilled metformin and januvia today Hyperlipidemia: on statin  Hypertension:at goal  Patient has set a series of personal goals and will f/u 1 mo for further review of DM

## 2013-12-20 ENCOUNTER — Ambulatory Visit (INDEPENDENT_AMBULATORY_CARE_PROVIDER_SITE_OTHER): Payer: 59

## 2013-12-20 ENCOUNTER — Ambulatory Visit (INDEPENDENT_AMBULATORY_CARE_PROVIDER_SITE_OTHER): Payer: 59 | Admitting: Family Medicine

## 2013-12-20 VITALS — BP 118/60 | HR 83 | Temp 97.6°F | Resp 18 | Ht 64.5 in | Wt 200.2 lb

## 2013-12-20 DIAGNOSIS — M25571 Pain in right ankle and joints of right foot: Secondary | ICD-10-CM

## 2013-12-20 DIAGNOSIS — M25471 Effusion, right ankle: Secondary | ICD-10-CM

## 2013-12-20 DIAGNOSIS — M25476 Effusion, unspecified foot: Secondary | ICD-10-CM

## 2013-12-20 DIAGNOSIS — M25579 Pain in unspecified ankle and joints of unspecified foot: Secondary | ICD-10-CM

## 2013-12-20 DIAGNOSIS — E119 Type 2 diabetes mellitus without complications: Secondary | ICD-10-CM

## 2013-12-20 DIAGNOSIS — M25473 Effusion, unspecified ankle: Secondary | ICD-10-CM

## 2013-12-20 LAB — URIC ACID: URIC ACID, SERUM: 5 mg/dL (ref 2.4–7.0)

## 2013-12-20 MED ORDER — DICLOFENAC SODIUM 75 MG PO TBEC: 75.0000 mg | DELAYED_RELEASE_TABLET | Freq: Two times a day (BID) | ORAL | Status: DC

## 2013-12-20 NOTE — Patient Instructions (Signed)
Apply ice for 10 or 15 minutes about for 5 times daily if possible.  Stay off of the foot as much of the time you reasonably hand, propping it up.  Wear the ankle splint  Take diclofenac one twice daily at breakfast and supper  Return if worse at any time if necessary or not improved over the next week or 10 days

## 2013-12-20 NOTE — Progress Notes (Signed)
Subjective: 53 year old lady who's been having pain in her right ankle and foot laterally. This been going on for one week or more. She does not know of any injury. She is on her feet a lot. She works at [employer].  No history of gout. Is diabetic.  Objective: Pleasant lady in no acute. No ankle edema but does have lateral swelling of the foot and ankle around the malleolus. Pulse is weak but present. Sensory of toes grossly normal. Capillary refill good. She is tender on the distal malleolus and around underneath and especially anterior to it. No erythema. Pain with inversion of the ankle especially.  [facility] reading (PRIMARY) by  Dr. Linna Darner Normal ankle  Assessment: Ankle pain, possibly a stress sprain  Plan: Treat with anti-inflammatory and immobilization. If not improving we will need to refer to orthopedics.Marland Kitchen

## 2013-12-24 ENCOUNTER — Telehealth: Payer: Self-pay | Admitting: *Deleted

## 2013-12-24 NOTE — Telephone Encounter (Signed)
Pt called back in regards to lab results. Informed her of results.

## 2013-12-30 NOTE — Progress Notes (Signed)
Patient ID: Brandy Burns, female   DOB: 1960/11/04, 53 y.o.   MRN: [id number] ATTENDING PHYSICIAN NOTE: I have reviewed the chart and agree with the plan as detailed above. Dorcas Mcmurray MD Pager [phone]

## 2013-12-30 NOTE — Progress Notes (Signed)
Patient ID: Brandy Burns, female   DOB: 01-28-1961, 53 y.o.   MRN: [id number] ATTENDING PHYSICIAN NOTE: I have reviewed the chart and agree with the plan as detailed above. Dorcas Mcmurray MD Pager [phone]

## 2014-03-03 ENCOUNTER — Ambulatory Visit (INDEPENDENT_AMBULATORY_CARE_PROVIDER_SITE_OTHER): Payer: Self-pay | Admitting: Family Medicine

## 2014-03-03 VITALS — BP 106/66 | HR 89 | Wt 195.0 lb

## 2014-03-03 DIAGNOSIS — E119 Type 2 diabetes mellitus without complications: Secondary | ICD-10-CM

## 2014-03-03 DIAGNOSIS — Z794 Long term (current) use of insulin: Secondary | ICD-10-CM

## 2014-03-03 NOTE — Progress Notes (Signed)
Patient presents for 3 mo f/u DM as part of the employee sponsored Link to [employer]. Medications, glucose readings, and insulin regimen have been reviewed. I have also discussed with patient lifestyle interventions such as diet and exercise. Full documentation of this visit can be found in the [employer] documenting system through [facility]). However specifics from this visit include the following:  Diabetes Mellitus:  POC A1C 5.8, at goal. Greatly decreased since June from 7.6. She is now on 32 units of lantus. She has also lost 8 lbs which has contributed to better blood sugar. She has felt shaky a couple of times but did not test her blood sugar. plan 1.) Reviewed rule of 15 for hypoglycemia. Counseled patient if she continues to lose weight she needs to be diligent about checking her blood sugar and may need to back down on her lantus. 2.) f/u 3 mo Hyperlipidemia:  on appropriately dosed statin, no changes.  Hypertension: at goal on lisinopril  Patient has set a series of personal goals and will f/u in 3 mo for further review of DM

## 2014-03-24 DIAGNOSIS — E119 Type 2 diabetes mellitus without complications: Secondary | ICD-10-CM | POA: Insufficient documentation

## 2014-03-24 DIAGNOSIS — Z794 Long term (current) use of insulin: Secondary | ICD-10-CM

## 2014-03-24 NOTE — Progress Notes (Signed)
Patient ID: Brandy Burns, female   DOB: [birth year], 53 y.o.   MRN: [id number] Reviewed: Agree with the documentation and management of our [place].

## 2014-06-16 ENCOUNTER — Ambulatory Visit (INDEPENDENT_AMBULATORY_CARE_PROVIDER_SITE_OTHER): Payer: Self-pay | Admitting: Family Medicine

## 2014-06-16 VITALS — BP 115/77 | HR 79 | Wt 199.0 lb

## 2014-06-16 DIAGNOSIS — E119 Type 2 diabetes mellitus without complications: Secondary | ICD-10-CM

## 2014-06-16 DIAGNOSIS — E785 Hyperlipidemia, unspecified: Secondary | ICD-10-CM

## 2014-06-16 DIAGNOSIS — I10 Essential (primary) hypertension: Secondary | ICD-10-CM

## 2014-06-16 HISTORY — DX: Essential (primary) hypertension: I10

## 2014-06-16 NOTE — Progress Notes (Signed)
Patient presents for 3 mo f/u DM as part of the employee sponsored Link to [employer]. Medications have been reviewed. I have also discussed with patient lifestyle interventions such as diet and exercise. Full documentation of this visit can be found in the caretracker documenting system through [facility]). However, specifics from this visit include the following:  Diabetes Mellitus: POC A1C 6.7, at goal. No recommended medication changes. plan 1.) Patient has gained 4 lb over the holidays. She will work on portion controlling and getting back down. 2.) refilled Januvia, metformin, pen needles, and test strips today Hyperlipidemia: on appropriately dosed statin. I refilled it for her today.  Hypertension: at goal today on lisinopril. I refilled it for her today  Patient has set a series of personal goals and will f/u in 3 mo for further review of DM

## 2014-06-27 ENCOUNTER — Other Ambulatory Visit: Payer: Self-pay

## 2014-06-27 DIAGNOSIS — Z1231 Encounter for screening mammogram for malignant neoplasm of breast: Secondary | ICD-10-CM

## 2014-07-06 ENCOUNTER — Ambulatory Visit
Admission: RE | Admit: 2014-07-06 | Discharge: 2014-07-06 | Disposition: A | Discharge status: Home / Self Care | Payer: 59 | Source: Ambulatory Visit

## 2014-07-06 ENCOUNTER — Other Ambulatory Visit: Payer: Self-pay

## 2014-07-06 DIAGNOSIS — Z1231 Encounter for screening mammogram for malignant neoplasm of breast: Secondary | ICD-10-CM

## 2014-07-13 ENCOUNTER — Ambulatory Visit (INDEPENDENT_AMBULATORY_CARE_PROVIDER_SITE_OTHER): Payer: 59 | Admitting: Emergency Medicine

## 2014-07-13 VITALS — BP 112/62 | HR 90 | Temp 98.2°F | Resp 18 | Ht 65.0 in | Wt 194.0 lb

## 2014-07-13 DIAGNOSIS — J029 Acute pharyngitis, unspecified: Secondary | ICD-10-CM

## 2014-07-13 LAB — POCT RAPID STREP A (OFFICE): Rapid Strep A Screen: NEGATIVE

## 2014-07-13 MED ORDER — AMOXICILLIN-POT CLAVULANATE 875-125 MG PO TABS: 1.0000 | ORAL_TABLET | Freq: Two times a day (BID) | ORAL | Status: DC

## 2014-07-13 MED ORDER — HYDROCODONE-HOMATROPINE 5-1.5 MG/5ML PO SYRP: 5.0000 mL | ORAL_SOLUTION | Freq: Three times a day (TID) | ORAL | Status: DC | PRN

## 2014-07-13 NOTE — Progress Notes (Signed)
   Subjective:    Patient ID: Brandy Burns, female    DOB: [birth year], 54 y.o.   MRN: [id number] This chart was scribed for Arlyss Queen, MD by Zola Button, Medical Scribe. This patient was seen in room 2 and the patient's care was started at 10:07 AM.   HPI HPI Comments: Brandy Burns is a 54 y.o. female with a hx of previous sinus infections, DM, HTN and HLD who presents to the [facility] complaining of gradual onset URI symptoms that started 5 days ago. She believes she has sinusitis. Patient reports having sore/scratchy throat, cough, congestion, and otalgia. Her symptoms started with sore throat. She has tried Tylenol, Sudafed and other OTCs for her symptoms. Patient denies fever and seasonal allergies. She also denies sick contacts. She last had sinusitis about 1 year ago. Patient has allergies to sulfa. She states that she can tolerate amoxicillin and Augmentin, but Z-pack is not effective for her. Patient is a 0.5 ppd smoker.  Patient states her sugar levels have been doing fine; her last A1C was 6.7.   Review of Systems  Constitutional: Negative for fever.  HENT: Positive for congestion, ear pain and sore throat.   Respiratory: Positive for cough.   Allergic/Immunologic: Negative for environmental allergies.       Objective:   Physical Exam CONSTITUTIONAL: Well developed/well nourished HEAD: Normocephalic/atraumatic EYES: EOM/PERRL ENMT: Significant nasal congestion. Scarring of both TMs. NECK: supple no meningeal signs SPINE: entire spine nontender CV: S1/S2 noted, no murmurs/rubs/gallops noted LUNGS: Lungs are clear to auscultation bilaterally, no apparent distress. No rales. ABDOMEN: soft, nontender, no rebound or guarding GU: no cva tenderness NEURO: Pt is awake/alert, moves all extremitiesx4 EXTREMITIES: pulses normal, full ROM SKIN: warm, color normal PSYCH: no abnormalities of mood noted  Meds ordered this encounter  Medications  .  pantoprazole (PROTONIX) 20 MG tablet    Sig: Take 20 mg by mouth daily.  Marland Kitchen amoxicillin-clavulanate (AUGMENTIN) 875-125 MG per tablet    Sig: Take 1 tablet by mouth 2 (two) times daily.    Dispense:  20 tablet    Refill:  0  . HYDROcodone-homatropine (HYCODAN) 5-1.5 MG/5ML syrup    Sig: Take 5 mLs by mouth every 8 (eight) hours as needed for cough.    Dispense:  120 mL    Refill:  0        Assessment & Plan:  We'll treat as a sinusitis with Augmentin and Hycodan. Recheck if symptoms persist.I personally performed the services described in this documentation, which was scribed in my presence. The recorded information has been reviewed and is accurate.

## 2014-07-13 NOTE — Patient Instructions (Signed)
Cough, Adult  A cough is a reflex that helps clear your throat and airways. It can help heal the body or may be a reaction to an irritated airway. A cough may only last 2 or 3 weeks (acute) or may last more than 8 weeks (chronic).  CAUSES Acute cough:  Viral or bacterial infections. Chronic cough:  Infections.  Allergies.  Asthma.  Post-nasal drip.  Smoking.  Heartburn or acid reflux.  Some medicines.  Chronic lung problems (COPD).  Cancer. SYMPTOMS   Cough.  Fever.  Chest pain.  Increased breathing rate.  High-pitched whistling sound when breathing (wheezing).  Colored mucus that you cough up (sputum). TREATMENT   A bacterial cough may be treated with antibiotic medicine.  A viral cough must run its course and will not respond to antibiotics.  Your caregiver may recommend other treatments if you have a chronic cough. HOME CARE INSTRUCTIONS   Only take over-the-counter or prescription medicines for pain, discomfort, or fever as directed by your caregiver. Use cough suppressants only as directed by your caregiver.  Use a cold steam vaporizer or humidifier in your bedroom or home to help loosen secretions.  Sleep in a semi-upright position if your cough is worse at night.  Rest as needed.  Stop smoking if you smoke. SEEK IMMEDIATE MEDICAL CARE IF:   You have pus in your sputum.  Your cough starts to worsen.  You cannot control your cough with suppressants and are losing sleep.  You begin coughing up blood.  You have difficulty breathing.  You develop pain which is getting worse or is uncontrolled with medicine.  You have a fever. MAKE SURE YOU:   Understand these instructions.  Will watch your condition.  Will get help right away if you are not doing well or get worse. Document Released: 11/23/2010 Document Revised: 08/19/2011 Document Reviewed: 11/23/2010 [facility] Patient Information 2015 [place], [place]. This information is not intended  to replace advice given to you by your health care provider. Make sure you discuss any questions you have with your health care provider. Sinusitis Sinusitis is redness, soreness, and inflammation of the paranasal sinuses. Paranasal sinuses are air pockets within the bones of your face (beneath the eyes, the middle of the forehead, or above the eyes). In healthy paranasal sinuses, mucus is able to drain out, and air is able to circulate through them by way of your nose. However, when your paranasal sinuses are inflamed, mucus and air can become trapped. This can allow bacteria and other germs to grow and cause infection. Sinusitis can develop quickly and last only a short time (acute) or continue over a long period (chronic). Sinusitis that lasts for more than 12 weeks is considered chronic.  CAUSES  Causes of sinusitis include:  Allergies.  Structural abnormalities, such as displacement of the cartilage that separates your nostrils (deviated septum), which can decrease the air flow through your nose and sinuses and affect sinus drainage.  Functional abnormalities, such as when the small hairs (cilia) that line your sinuses and help remove mucus do not work properly or are not present. SIGNS AND SYMPTOMS  Symptoms of acute and chronic sinusitis are the same. The primary symptoms are pain and pressure around the affected sinuses. Other symptoms include:  Upper toothache.  Earache.  Headache.  Bad breath.  Decreased sense of smell and taste.  A cough, which worsens when you are lying flat.  Fatigue.  Fever.  Thick drainage from your nose, which often is green and  may contain pus (purulent).  Swelling and warmth over the affected sinuses. DIAGNOSIS  Your health care provider will perform a physical exam. During the exam, your health care provider may:  Look in your nose for signs of abnormal growths in your nostrils (nasal polyps).  Tap over the affected sinus to check for signs  of infection.  View the inside of your sinuses (endoscopy) using an imaging device that has a light attached (endoscope). If your health care provider suspects that you have chronic sinusitis, one or more of the following tests may be recommended:  Allergy tests.  Nasal culture. A sample of mucus is taken from your nose, sent to a lab, and screened for bacteria.  Nasal cytology. A sample of mucus is taken from your nose and examined by your health care provider to determine if your sinusitis is related to an allergy. TREATMENT  Most cases of acute sinusitis are related to a viral infection and will resolve on their own within 10 days. Sometimes medicines are prescribed to help relieve symptoms (pain medicine, decongestants, nasal steroid sprays, or saline sprays).  However, for sinusitis related to a bacterial infection, your health care provider will prescribe antibiotic medicines. These are medicines that will help kill the bacteria causing the infection.  Rarely, sinusitis is caused by a fungal infection. In theses cases, your health care provider will prescribe antifungal medicine. For some cases of chronic sinusitis, surgery is needed. Generally, these are cases in which sinusitis recurs more than 3 times per year, despite other treatments. HOME CARE INSTRUCTIONS   Drink plenty of water. Water helps thin the mucus so your sinuses can drain more easily.  Use a humidifier.  Inhale steam 3 to 4 times a day (for example, sit in the bathroom with the shower running).  Apply a warm, moist washcloth to your face 3 to 4 times a day, or as directed by your health care provider.  Use saline nasal sprays to help moisten and clean your sinuses.  Take medicines only as directed by your health care provider.  If you were prescribed either an antibiotic or antifungal medicine, finish it all even if you start to feel better. SEEK IMMEDIATE MEDICAL CARE IF:  You have increasing pain or severe  headaches.  You have nausea, vomiting, or drowsiness.  You have swelling around your face.  You have vision problems.  You have a stiff neck.  You have difficulty breathing. MAKE SURE YOU:   Understand these instructions.  Will watch your condition.  Will get help right away if you are not doing well or get worse. Document Released: 05/27/2005 Document Revised: 10/11/2013 Document Reviewed: 06/11/2011 [facility] Patient Information 2015 [place], [place]. This information is not intended to replace advice given to you by your health care provider. Make sure you discuss any questions you have with your health care provider.

## 2014-07-18 NOTE — Progress Notes (Signed)
Patient ID: Brandy Burns, female   DOB: [birth year], 54 y.o.   MRN: [id number] ATTENDING PHYSICIAN NOTE: I have reviewed the chart and agree with the plan as detailed above. Dorcas Mcmurray MD Pager [phone]

## 2014-11-10 ENCOUNTER — Encounter: Payer: Self-pay | Admitting: Pharmacist

## 2014-11-10 ENCOUNTER — Other Ambulatory Visit: Payer: Self-pay | Admitting: Pharmacist

## 2014-11-10 NOTE — Patient Outreach (Signed)
[place] [facility]) Care Management  [place]   11/10/2014  Brandy Burns 1961/01/22 [id number]  Subjective:   Patient presents today for 3 month diabetes follow-up as part of the employer-sponsored [employer] [facility] [employer] program.  Current diabetes regimen includes Lantus and metformin  Patient also continues on daily ASA, ACE Inhibitor and statin.  Most recent MD follow-up was March 2016  Patient has a pending appt for"sometime this summer."  No med changes or major health changes at this time.   Patient is concerned about her weight gain and would like to focus her efforts on losing weight. She is aware that she needs to change her diet and exercise but she finds it very difficult to do due to the cost of healthier foods, having to cater to the family's food preferences, and time.   Patient is also a current smoker. She would like to quit smoking but also reports that this is difficult because her son, who lives at home, also smokes and he is not interested in quitting. She is very concerned about the weight gain from quitting smoking.  Objective:   Current Medications: Current Outpatient Prescriptions  Medication Sig Dispense Refill  . aspirin 81 MG tablet Take 81 mg by mouth daily.    . insulin glargine (LANTUS) 100 UNIT/ML injection Inject 32 Units into the skin at bedtime.     Marland Kitchen lisinopril (PRINIVIL,ZESTRIL) 10 MG tablet Take 10 mg by mouth daily.    . metFORMIN (GLUCOPHAGE) 1000 MG tablet Take 1,000 mg by mouth 2 (two) times daily with a meal.     . pantoprazole (PROTONIX) 20 MG tablet Take 20 mg by mouth daily.    . simvastatin (ZOCOR) 40 MG tablet Take 40 mg by mouth every evening.    . sitaGLIPtin (JANUVIA) 100 MG tablet Take 100 mg by mouth daily.    Marland Kitchen amoxicillin-clavulanate (AUGMENTIN) 875-125 MG per tablet Take 1 tablet by mouth 2 (two) times daily. (Patient not taking: Reported on 11/10/2014) 20 tablet 0  . HYDROcodone-homatropine (HYCODAN) 5-1.5 MG/5ML syrup  Take 5 mLs by mouth every 8 (eight) hours as needed for cough. (Patient not taking: Reported on 11/10/2014) 120 mL 0   No current facility-administered medications for this visit.   Assessment/Plan:  Patient is a 54 y.o. female  with DM Type 2. Most recent A1C was 7.1  % which is slightly exceeding goal of less than 7%. Weight is increased from last visit with [facility] [employer].   Lifestyle improvements:  Physical Activity- patient walks 20 minutes three times a week. Recommended that patient try to increase physical activity to 30 minutes a day, even if just walking, as this will help with weight loss.   Nutrition- patient not following a specific diet. Recommended that she try to incorporate healthier food choices, such as less carbs and starches, and more vegetables and lean meats, and substituting fruits for desserts.   Smoking cessation - patient is contemplating smoking cessation. She has quit for a year in the past but feels that is it too difficult right now as her son (lives at home) smokes. She is also very concerned about the weight gain from smoking cessation. I told her that smoking cessation is the most important thing that she could do for her health right now. She would like to think about it and discuss with Brandy Burns at next visit [facility] has assisted her with smoking cessation in the past). She is interested in bupropion. She  would like to focus on weight loss over the next few months and then address smoking cessation instead of addressing both at the same time. Encouraged patient to focus on weight loss through lifestyle changes over the next few months so that when she sees the pharmacist for her next visit, she is has a weight loss plan in place, which will likely make her more successful for smoking cessation.   Medications - no issues noted with medications. No recommendations for any changes.   Follow up with [facility] in 3 months.   [name], PharmD,  [place] Resident [place] [phone]

## 2014-11-10 NOTE — Patient Instructions (Signed)
It was nice to meet you today!  Please follow up with [facility] in 3 months

## 2015-02-14 ENCOUNTER — Ambulatory Visit: Payer: 59

## 2015-02-16 ENCOUNTER — Ambulatory Visit (INDEPENDENT_AMBULATORY_CARE_PROVIDER_SITE_OTHER): Payer: 59 | Admitting: Family Medicine

## 2015-02-16 VITALS — BP 122/107 | HR 82 | Wt 199.0 lb

## 2015-02-16 DIAGNOSIS — E119 Type 2 diabetes mellitus without complications: Secondary | ICD-10-CM

## 2015-02-16 NOTE — Progress Notes (Signed)
Patient presents for 3 month follow up for diabetes as part of the employee sponsored Link to [employer]. Medications have been reviewed. I have also discussed with patient lifestyle interventions such as diet and exercise.   Diabetes-POC A1C 7.1, above goal. Patient has been eating out a lot more. She will purchase a pedometer with goal of 10,000 steps per day. I will also notify her physician of her A1C and recommend titrating up on the lantus by 2 units every 2 days until fasting blood sugar is less than 130 (she did not bring meter today).   Hypertension- slightly elevated  At 122/107 but was 130/81 on recheck. Patient purchased a blood pressure monitor today to check at home. Counseled on goal blood pressure.  Lipids- on appropriately dosed statin  Patient has set a series of personal goals and will follow up in 3 months for further review of Diabetes.

## 2015-02-21 NOTE — Progress Notes (Signed)
ATTENDING PHYSICIAN NOTE: I have reviewed the chart and agree with the plan as detailed above. Harriet Bollen MD Pager [phone]  

## 2015-03-06 ENCOUNTER — Encounter (HOSPITAL_COMMUNITY): Payer: Self-pay | Admitting: Emergency Medicine

## 2015-03-06 ENCOUNTER — Emergency Department (HOSPITAL_COMMUNITY)
Admission: EM | Admit: 2015-03-06 | Discharge: 2015-03-06 | Disposition: A | Discharge status: Home / Self Care | Payer: 59 | Source: Home / Self Care | Attending: Family Medicine | Admitting: Family Medicine

## 2015-03-06 DIAGNOSIS — J301 Allergic rhinitis due to pollen: Secondary | ICD-10-CM

## 2015-03-06 DIAGNOSIS — J309 Allergic rhinitis, unspecified: Secondary | ICD-10-CM | POA: Diagnosis not present

## 2015-03-06 DIAGNOSIS — H6983 Other specified disorders of Eustachian tube, bilateral: Secondary | ICD-10-CM

## 2015-03-06 DIAGNOSIS — T700XXA Otitic barotrauma, initial encounter: Secondary | ICD-10-CM

## 2015-03-06 MED ORDER — IPRATROPIUM BROMIDE 0.06 % NA SOLN: 2.0000 | Freq: Four times a day (QID) | NASAL | Status: DC

## 2015-03-06 MED ORDER — TRAMADOL HCL 50 MG PO TABS: 50.0000 mg | ORAL_TABLET | Freq: Four times a day (QID) | ORAL | Status: DC | PRN

## 2015-03-06 MED ORDER — PREDNISONE 20 MG PO TABS: ORAL_TABLET | ORAL | Status: DC

## 2015-03-06 NOTE — Discharge Instructions (Signed)
Allergic Rhinitis Allegra or zyrtec daily Sudafed PE 10 mg every 4-6h as needed for congestion Saline nasal spray atrovent nasal spray Prednisone taper, take with food Tramadol for pain Allergic rhinitis is when the mucous membranes in the nose respond to allergens. Allergens are particles in the air that cause your body to have an allergic reaction. This causes you to release allergic antibodies. Through a chain of events, these eventually cause you to release histamine into the blood stream. Although meant to protect the body, it is this release of histamine that causes your discomfort, such as frequent sneezing, congestion, and an itchy, runny nose.  CAUSES  Seasonal allergic rhinitis (hay fever) is caused by pollen allergens that may come from grasses, trees, and weeds. Year-round allergic rhinitis (perennial allergic rhinitis) is caused by allergens such as house dust mites, pet dander, and mold spores.  SYMPTOMS   Nasal stuffiness (congestion).  Itchy, runny nose with sneezing and tearing of the eyes. DIAGNOSIS  Your health care provider can help you determine the allergen or allergens that trigger your symptoms. If you and your health care provider are unable to determine the allergen, skin or blood testing may be used. TREATMENT  Allergic rhinitis does not have a cure, but it can be controlled by:  Medicines and allergy shots (immunotherapy).  Avoiding the allergen. Hay fever may often be treated with antihistamines in pill or nasal spray forms. Antihistamines block the effects of histamine. There are over-the-counter medicines that may help with nasal congestion and swelling around the eyes. Check with your health care provider before taking or giving this medicine.  If avoiding the allergen or the medicine prescribed do not work, there are many new medicines your health care provider can prescribe. Stronger medicine may be used if initial measures are ineffective. Desensitizing  injections can be used if medicine and avoidance does not work. Desensitization is when a patient is given ongoing shots until the body becomes less sensitive to the allergen. Make sure you follow up with your health care provider if problems continue. HOME CARE INSTRUCTIONS It is not possible to completely avoid allergens, but you can reduce your symptoms by taking steps to limit your exposure to them. It helps to know exactly what you are allergic to so that you can avoid your specific triggers. SEEK MEDICAL CARE IF:   You have a fever.  You develop a cough that does not stop easily (persistent).  You have shortness of breath.  You start wheezing.  Symptoms interfere with normal daily activities. Document Released: 02/19/2001 Document Revised: 06/01/2013 Document Reviewed: 02/01/2013 [facility] Patient Information 2015 [place], [place]. This information is not intended to replace advice given to you by your health care provider. Make sure you discuss any questions you have with your health care provider.  Barotitis Media Barotitis media is inflammation of your middle ear. This occurs when the auditory tube (eustachian tube) leading from the back of your nose (nasopharynx) to your eardrum is blocked. This blockage may result from a cold, environmental allergies, or an upper respiratory infection. Unresolved barotitis media may lead to damage or hearing loss (barotrauma), which may become permanent. HOME CARE INSTRUCTIONS   Use medicines as recommended by your health care provider. Over-the-counter medicines will help unblock the canal and can help during times of air travel.  Do not put anything into your ears to clean or unplug them. Eardrops will not be helpful.  Do not swim, dive, or fly until your health care provider says  it is all right to do so. If these activities are necessary, chewing gum with frequent, forceful swallowing may help. It is also helpful to hold your nose and gently  blow to pop your ears for equalizing pressure changes. This forces air into the eustachian tube.  Only take over-the-counter or prescription medicines for pain, discomfort, or fever as directed by your health care provider.  A decongestant may be helpful in decongesting the middle ear and make pressure equalization easier. SEEK MEDICAL CARE IF:  You experience a serious form of dizziness in which you feel as if the room is spinning and you feel nauseated (vertigo).  Your symptoms only involve one ear. SEEK IMMEDIATE MEDICAL CARE IF:   You develop a severe headache, dizziness, or severe ear pain.  You have bloody or pus-like drainage from your ears.  You develop a fever.  Your problems do not improve or become worse. MAKE SURE YOU:   Understand these instructions.  Will watch your condition.  Will get help right away if you are not doing well or get worse. Document Released: 05/24/2000 Document Revised: 03/17/2013 Document Reviewed: 12/22/2012 [facility] Patient Information 2015 [place], [place]. This information is not intended to replace advice given to you by your health care provider. Make sure you discuss any questions you have with your health care provider.

## 2015-03-06 NOTE — ED Provider Notes (Signed)
CSN: [id number]     Arrival date & time 03/06/15  1301 History   First MD Initiated Contact with Patient 03/06/15 1321     Chief Complaint  Patient presents with  . Otalgia  . Nasal Congestion   (Consider location/radiation/quality/duration/timing/severity/associated sxs/prior Treatment) Patient is a 54 y.o. female presenting with ear pain. The history is provided by the patient.  Otalgia Location:  Right Quality:  Pressure and sore Severity:  Moderate Onset quality:  Sudden Duration:  3 days Timing:  Constant Progression:  Worsening Chronicity:  New Context: not direct blow, not foreign body in ear and no water in ear   Relieved by:  Nothing Ineffective treatments:  OTC medications Associated symptoms: congestion, cough, headaches and rhinorrhea   Associated symptoms: no abdominal pain, no diarrhea, no ear discharge, no fever, no hearing loss, no neck pain, no rash, no sore throat and no vomiting     Past Medical History  Diagnosis Date  . Diabetes mellitus   . GERD (gastroesophageal reflux disease)   . Hyperlipemia   . Complication of anesthesia   . Essential hypertension 06/16/2014   Past Surgical History  Procedure Laterality Date  . Tonsillectomy    . Tubal ligation    . Ectopic pregnancy surgery    . Colonoscopy  09/23/2011    Procedure: COLONOSCOPY;  Surgeon: Juanita Craver, MD;  Location: [facility];  Service: Endoscopy;  Laterality: N/A;   Family History  Problem Relation Age of Onset  . Hypertension Other   . Hyperlipidemia Other   . Arthritis Other   . COPD Other   . Obesity Other   . COPD Mother   . Diabetes Mother   . Hypertension Mother   . Cancer Father   . Diabetes Brother   . Hypertension Brother    Social History  Substance Use Topics  . Smoking status: Current Every Day Smoker -- 0.50 packs/day for 0 years    Types: Cigarettes  . Smokeless tobacco: Never Used  . Alcohol Use: No   OB History    No data available     Review of Systems   Constitutional: Positive for activity change. Negative for fever and chills.  HENT: Positive for congestion, ear pain, postnasal drip and rhinorrhea. Negative for ear discharge, hearing loss and sore throat.   Eyes: Negative.   Respiratory: Positive for cough. Negative for choking, shortness of breath and stridor.   Cardiovascular: Negative for chest pain and leg swelling.  Gastrointestinal: Negative.  Negative for vomiting, abdominal pain and diarrhea.  Musculoskeletal: Negative for neck pain.  Skin: Negative for rash.  Neurological: Positive for headaches.    Allergies  Sulfa antibiotics  Home Medications   Prior to Admission medications   Medication Sig Start Date End Date Taking? Authorizing Provider  aspirin 81 MG tablet Take 81 mg by mouth daily.   Yes Historical Provider, MD  insulin glargine (LANTUS) 100 UNIT/ML injection Inject 32 Units into the skin at bedtime.    Yes Historical Provider, MD  lisinopril (PRINIVIL,ZESTRIL) 10 MG tablet Take 10 mg by mouth daily.   Yes Historical Provider, MD  metFORMIN (GLUCOPHAGE) 1000 MG tablet Take 1,000 mg by mouth 2 (two) times daily with a meal.    Yes Historical Provider, MD  sitaGLIPtin (JANUVIA) 100 MG tablet Take 100 mg by mouth daily.   Yes Historical Provider, MD  ipratropium (ATROVENT) 0.06 % nasal spray Place 2 sprays into both nostrils 4 (four) times daily. 03/06/15   Janne Napoleon, NP  pantoprazole (PROTONIX) 40 MG tablet Take 40 mg by mouth daily as needed.    Historical Provider, MD  predniSONE (DELTASONE) 20 MG tablet Take 2 tabs day 1 thru 3, 1 tab fourth, fifth and sixth day. Take with food. 03/06/15   Janne Napoleon, NP  simvastatin (ZOCOR) 40 MG tablet Take 40 mg by mouth every evening.    Historical Provider, MD  traMADol (ULTRAM) 50 MG tablet Take 1 tablet (50 mg total) by mouth every 6 (six) hours as needed. 03/06/15   Janne Napoleon, NP   Meds Ordered and Administered this Visit  Medications - No data to display  BP 115/80 mmHg   Pulse 91  Temp(Src) 98.9 F (37.2 C) (Oral)  Resp 18  SpO2 97% No data found.   Physical Exam  Constitutional: She is oriented to person, place, and time. She appears well-developed and well-nourished. No distress.  HENT:  Mouth/Throat: No oropharyngeal exudate.  Bilateral TMs are retracted. No erythema or effusion. No drainage. Oropharynx with mild erythema, cobblestoning and clear PND.  Eyes: Conjunctivae and EOM are normal.  Neck: Normal range of motion. Neck supple.  Cardiovascular: Normal rate, regular rhythm and normal heart sounds.   Pulmonary/Chest: Effort normal and breath sounds normal. No respiratory distress.  Breath sounds generally diminished likely due to years of smoking. No adventitious sounds.  Musculoskeletal: Normal range of motion. She exhibits no edema.  Lymphadenopathy:    She has no cervical adenopathy.  Neurological: She is alert and oriented to person, place, and time.  Skin: Skin is warm and dry. No rash noted.  Psychiatric: She has a normal mood and affect.  Nursing note and vitals reviewed.   ED Course  Procedures (including critical care time)  Labs Review Labs Reviewed - No data to display  Imaging Review No results found.   Visual Acuity Review  Right Eye Distance:   Left Eye Distance:   Bilateral Distance:    Right Eye Near:   Left Eye Near:    Bilateral Near:         MDM   1. Allergic rhinitis due to pollen   2. Allergic sinusitis   3. Barotitis media, initial encounter   4. ETD (eustachian tube dysfunction), bilateral    Allegra or zyrtec daily Sudafed PE 10 mg every 4-6h as needed for congestion Saline nasal spray atrovent nasal spray Prednisone taper, take with food Tramadol for pain Stop smoking    Janne Napoleon, NP 03/06/15 1344

## 2015-03-06 NOTE — ED Notes (Signed)
Pt has been suffering from right sides ear, head, sinus, and neck pressure, congestion, and pain since Friday.  Pt denies any fever.

## 2015-06-13 MED FILL — LISINOPRIL 10 MG TABLET: 10 | 90 days supply | Qty: 90 | Fill #0

## 2015-06-13 MED FILL — SIMVASTATIN 40 MG TABLET: 40 | 90 days supply | Qty: 90 | Fill #0

## 2015-06-13 MED FILL — TRUEplus LANCETS 30G MISC: 90 days supply | Qty: 100 | Fill #0

## 2015-06-13 MED FILL — JANUVIA 100 MG TABLET: 100 | 90 days supply | Qty: 90 | Fill #0

## 2015-06-14 ENCOUNTER — Encounter (HOSPITAL_COMMUNITY): Payer: Self-pay | Admitting: Emergency Medicine

## 2015-06-14 ENCOUNTER — Emergency Department (HOSPITAL_COMMUNITY)
Admission: EM | Admit: 2015-06-14 | Discharge: 2015-06-14 | Disposition: A | Discharge status: Home / Self Care | Payer: PRIVATE HEALTH INSURANCE | Attending: Emergency Medicine | Admitting: Emergency Medicine

## 2015-06-14 ENCOUNTER — Emergency Department (HOSPITAL_COMMUNITY): Payer: PRIVATE HEALTH INSURANCE

## 2015-06-14 DIAGNOSIS — E785 Hyperlipidemia, unspecified: Secondary | ICD-10-CM | POA: Diagnosis not present

## 2015-06-14 DIAGNOSIS — Y9301 Activity, walking, marching and hiking: Secondary | ICD-10-CM | POA: Diagnosis not present

## 2015-06-14 DIAGNOSIS — Y998 Other external cause status: Secondary | ICD-10-CM | POA: Diagnosis not present

## 2015-06-14 DIAGNOSIS — K219 Gastro-esophageal reflux disease without esophagitis: Secondary | ICD-10-CM | POA: Insufficient documentation

## 2015-06-14 DIAGNOSIS — Z79899 Other long term (current) drug therapy: Secondary | ICD-10-CM | POA: Diagnosis not present

## 2015-06-14 DIAGNOSIS — W010XXA Fall on same level from slipping, tripping and stumbling without subsequent striking against object, initial encounter: Secondary | ICD-10-CM | POA: Insufficient documentation

## 2015-06-14 DIAGNOSIS — F17210 Nicotine dependence, cigarettes, uncomplicated: Secondary | ICD-10-CM | POA: Insufficient documentation

## 2015-06-14 DIAGNOSIS — E119 Type 2 diabetes mellitus without complications: Secondary | ICD-10-CM | POA: Diagnosis not present

## 2015-06-14 DIAGNOSIS — S52502A Unspecified fracture of the lower end of left radius, initial encounter for closed fracture: Secondary | ICD-10-CM | POA: Diagnosis not present

## 2015-06-14 DIAGNOSIS — S52522A Torus fracture of lower end of left radius, initial encounter for closed fracture: Secondary | ICD-10-CM | POA: Diagnosis not present

## 2015-06-14 DIAGNOSIS — S80212A Abrasion, left knee, initial encounter: Secondary | ICD-10-CM | POA: Diagnosis not present

## 2015-06-14 DIAGNOSIS — S62102A Fracture of unspecified carpal bone, left wrist, initial encounter for closed fracture: Secondary | ICD-10-CM

## 2015-06-14 DIAGNOSIS — Z794 Long term (current) use of insulin: Secondary | ICD-10-CM | POA: Insufficient documentation

## 2015-06-14 DIAGNOSIS — Z7984 Long term (current) use of oral hypoglycemic drugs: Secondary | ICD-10-CM | POA: Insufficient documentation

## 2015-06-14 DIAGNOSIS — S52592A Other fractures of lower end of left radius, initial encounter for closed fracture: Secondary | ICD-10-CM | POA: Diagnosis not present

## 2015-06-14 DIAGNOSIS — I10 Essential (primary) hypertension: Secondary | ICD-10-CM | POA: Diagnosis not present

## 2015-06-14 DIAGNOSIS — Z7982 Long term (current) use of aspirin: Secondary | ICD-10-CM | POA: Insufficient documentation

## 2015-06-14 DIAGNOSIS — Y9289 Other specified places as the place of occurrence of the external cause: Secondary | ICD-10-CM | POA: Insufficient documentation

## 2015-06-14 DIAGNOSIS — S6992XA Unspecified injury of left wrist, hand and finger(s), initial encounter: Secondary | ICD-10-CM | POA: Diagnosis present

## 2015-06-14 MED ORDER — HYDROCODONE-ACETAMINOPHEN 5-325 MG PO TABS: 1.0000 | ORAL_TABLET | ORAL | Status: DC | PRN

## 2015-06-14 MED FILL — BD PEN NDL NANO 32GX5/32: 32G X 4 MM | 50 days supply | Qty: 100 | Fill #0

## 2015-06-14 NOTE — ED Notes (Signed)
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  Also skinned her knee.

## 2015-06-14 NOTE — Discharge Instructions (Signed)
It is important to follow-up with orthopedics, Dr. Veverly Fells. Please call his office tomorrow and schedule an appointment for reevaluation. Take your medications as prescribed. He will continue taking Tylenol and Motrin for moderate pain, your pain medicine for breakthrough pain. Do not take her pain medicine before driving as it can make you tired. Return to ED for any new or worsening symptoms as we discussed.  Cast or Splint Care Casts and splints support injured limbs and keep bones from moving while they heal. It is important to care for your cast or splint at home.  HOME CARE INSTRUCTIONS  Keep the cast or splint uncovered during the drying period. It can take 24 to 48 hours to dry if it is made of plaster. A fiberglass cast will dry in less than 1 hour.  Do not rest the cast on anything harder than a pillow for the first 24 hours.  Do not put weight on your injured limb or apply pressure to the cast until your health care provider gives you permission.  Keep the cast or splint dry. Wet casts or splints can lose their shape and may not support the limb as well. A wet cast that has lost its shape can also create harmful pressure on your skin when it dries. Also, wet skin can become infected.  Cover the cast or splint with a plastic bag when bathing or when out in the rain or snow. If the cast is on the trunk of the body, take sponge baths until the cast is removed.  If your cast does become wet, dry it with a towel or a blow dryer on the cool setting only.  Keep your cast or splint clean. Soiled casts may be wiped with a moistened cloth.  Do not place any hard or soft foreign objects under your cast or splint, such as cotton, toilet paper, lotion, or powder.  Do not try to scratch the skin under the cast with any object. The object could get stuck inside the cast. Also, scratching could lead to an infection. If itching is a problem, use a blow dryer on a cool setting to relieve  discomfort.  Do not trim or cut your cast or remove padding from inside of it.  Exercise all joints next to the injury that are not immobilized by the cast or splint. For example, if you have a long leg cast, exercise the hip joint and toes. If you have an arm cast or splint, exercise the shoulder, elbow, thumb, and fingers.  Elevate your injured arm or leg on 1 or 2 pillows for the first 1 to 3 days to decrease swelling and pain.It is best if you can comfortably elevate your cast so it is higher than your heart. SEEK MEDICAL CARE IF:   Your cast or splint cracks.  Your cast or splint is too tight or too loose.  You have unbearable itching inside the cast.  Your cast becomes wet or develops a soft spot or area.  You have a bad smell coming from inside your cast.  You get an object stuck under your cast.  Your skin around the cast becomes red or raw.  You have new pain or worsening pain after the cast has been applied. SEEK IMMEDIATE MEDICAL CARE IF:   You have fluid leaking through the cast.  You are unable to move your fingers or toes.  You have discolored (blue or white), cool, painful, or very swollen fingers or toes beyond the cast.  You have tingling or numbness around the injured area.  You have severe pain or pressure under the cast.  You have any difficulty with your breathing or have shortness of breath.  You have chest pain.   This information is not intended to replace advice given to you by your health care provider. Make sure you discuss any questions you have with your health care provider.   Document Released: 05/24/2000 Document Revised: 03/17/2013 Document Reviewed: 12/03/2012 Elsevier Interactive Patient Education [employer].

## 2015-06-14 NOTE — ED Provider Notes (Signed)
CSN: [id number]     Arrival date & time 06/14/15  1247 History  By signing my name below, I, Hansel Feinstein, attest that this documentation has been prepared under the direction and in the presence of  Comer Locket, PA-C. Electronically Signed: Hansel Feinstein, ED Scribe. 06/14/2015. 1:23 PM.    Chief Complaint  Patient presents with  . Fall  . Wrist Pain  . Knee Pain   The history is provided by the patient. No language interpreter was used.    HPI Comments: Brandy Burns is a 55 y.o. female with h/o DM, GERD, HLD, essential HTN who presents to the Emergency Department complaining of moderate left wrist pain and swelling, abrasion with controlled bleeding to the left knee s/p mechanical fall that occurred at lunch time. 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 She denies numbness, weakness, paresthesia, additional injuries.   Past Medical History  Diagnosis Date  . Diabetes mellitus   . GERD (gastroesophageal reflux disease)   . Hyperlipemia   . Complication of anesthesia   . Essential hypertension 06/16/2014   Past Surgical History  Procedure Laterality Date  . Tonsillectomy    . Tubal ligation    . Ectopic pregnancy surgery    . Colonoscopy  09/23/2011    Procedure: COLONOSCOPY;  Surgeon: Juanita Craver, MD;  Location: [facility];  Service: Endoscopy;  Laterality: N/A;   Family History  Problem Relation Age of Onset  . Hypertension Other   . Hyperlipidemia Other   . Arthritis Other   . COPD Other   . Obesity Other   . COPD Mother   . Diabetes Mother   . Hypertension Mother   . Cancer Father   . Diabetes Brother   . Hypertension Brother    Social History  Substance Use Topics  . Smoking status: Current Every Day Smoker -- 0.50 packs/day for 0 years    Types: Cigarettes  . Smokeless tobacco: Never Used  . Alcohol Use: No   OB History    No data available      Review of Systems A 10 point review of systems was completed and was negative except for pertinent positives and negatives as mentioned in the history of present illness.   Allergies  Sulfa antibiotics  Home Medications   Prior to Admission medications   Medication Sig Start Date End Date Taking? Authorizing Provider  aspirin 81 MG tablet Take 81 mg by mouth daily.    Historical Provider, MD  HYDROcodone-acetaminophen (NORCO/VICODIN) 5-325 MG tablet Take 1-2 tablets by mouth every 4 (four) hours as needed. 06/14/15   Comer Locket, PA-C  insulin glargine (LANTUS) 100 UNIT/ML injection Inject 32 Units into the skin at bedtime.     Historical Provider, MD  ipratropium (ATROVENT) 0.06 % nasal spray Place 2 sprays into both nostrils 4 (four) times daily. 03/06/15   Janne Napoleon, NP  lisinopril (PRINIVIL,ZESTRIL) 10 MG tablet Take 10 mg by mouth daily.    Historical Provider, MD  metFORMIN (GLUCOPHAGE) 1000 MG tablet Take 1,000 mg by mouth 2 (two) times daily with a meal.     Historical Provider, MD  pantoprazole (PROTONIX) 40 MG tablet Take 40 mg by mouth daily as needed.    Historical Provider, MD  predniSONE (DELTASONE) 20 MG tablet Take 2 tabs day 1 thru 3, 1 tab fourth, fifth and sixth day. Take with food. 03/06/15  Janne Napoleon, NP  simvastatin (ZOCOR) 40 MG tablet Take 40 mg by mouth every evening.    Historical Provider, MD  sitaGLIPtin (JANUVIA) 100 MG tablet Take 100 mg by mouth daily.    Historical Provider, MD  traMADol (ULTRAM) 50 MG tablet Take 1 tablet (50 mg total) by mouth every 6 (six) hours as needed. 03/06/15   Janne Napoleon, NP   BP 112/62 mmHg  Pulse 71  Temp(Src) 97.7 F (36.5 C) (Oral)  Resp 14  SpO2 100% Physical Exam  Constitutional: She is oriented to person, place, and time. She appears well-developed and well-nourished.  Awake, alert, nontoxic appearance.    HENT:  Head: Normocephalic and atraumatic.  Eyes: Conjunctivae and EOM are normal. Pupils are equal, round,  and reactive to light.  Neck: Normal range of motion. Neck supple.  Cardiovascular: Normal rate, regular rhythm and normal heart sounds.  Exam reveals no gallop and no friction rub.   No murmur heard. Heart sounds normal. RRR.    Pulmonary/Chest: Effort normal and breath sounds normal. No respiratory distress. She has no wheezes. She has no rales.  Lungs CTA bilaterally.   Abdominal: She exhibits no distension.  Musculoskeletal: Normal range of motion. She exhibits edema and tenderness.  Mild swelling to the left wrist with no focal bony tenderness. DPs intact. No erythema. Full active ROM. NVI.   Neurological: She is alert and oriented to person, place, and time.  Skin: Skin is warm and dry.  Left knee abrasion, approximately 5 cm in diameter. Full active ROM of the left knee.   Psychiatric: She has a normal mood and affect. Her behavior is normal.  Nursing note and vitals reviewed.   ED Course  Procedures (including critical care time) DIAGNOSTIC STUDIES: Oxygen Saturation is 100% on RA, normal by my interpretation.    COORDINATION OF CARE: 1:19 PM Discussed treatment plan with pt at bedside which includes wrist XR and pt agreed to plan.   Imaging Review Dg Wrist Complete Left  06/14/2015  CLINICAL DATA:  Status post fall on an outstretched hand when the patient tripped over a curb at today. Pain. Initial encounter. EXAM: LEFT WRIST - COMPLETE 3+ VIEW COMPARISON:  None. FINDINGS: The patient has a fracture of the distal metaphysis of the radius. The fracture is slightly impacted and demonstrates very mild volar angulation. No definite disruption of the articular surface is identified. No other acute bony or joint abnormality is seen. Soft tissue swelling about the wrist is noted. IMPRESSION: Minimally impacted and angulated distal metaphysis fracture left radius with associated soft tissue swelling. Electronically Signed   By: Inge Rise M.D.   On: 06/14/2015 13:46   I have  personally reviewed and evaluated these images as part of my medical decision-making.  SPLINT APPLICATION Date/Time: [id number] PM Authorized by: Verl Dicker Consent: Verbal consent obtained. Risks and benefits: risks, benefits and alternatives were discussed Consent given by: patient Splint applied by: orthopedic technician Location details: Left wrist  Splint type: Sugar tong  Supplies used: Splint material's  Post-procedure: The splinted body part was neurovascularly unchanged following the procedure. Patient tolerance: Patient tolerated the procedure well with no immediate complications.     MDM   Final diagnoses:  Left wrist fracture, closed, initial encounter   Filed Vitals:   06/14/15 1259 06/14/15 1455  BP: 119/69 112/62  Pulse: 76 71  Temp: 97.7 F (36.5 C)   Resp: 18 14    Meds given in ED:  Medications - No  data to display  Discharge Medication List as of 06/14/2015  2:52 PM    START taking these medications   Details  HYDROcodone-acetaminophen (NORCO/VICODIN) 5-325 MG tablet Take 1-2 tablets by mouth every 4 (four) hours as needed., Starting 06/14/2015, Until Discontinued, Print         Pt here for evaluation of left wrist pain and an left knee abrasion after a mechanical fall today. Patient X-Ray showed minimally impacted and angulated distal metaphysis fracture left radius with associated soft tissue swelling. Patient is neurovascularly intact with no evidence of compartment syndrome or other vascular compromise. Pt advised to follow up with orthopedics in the next 1-2 days. Referral given. Patient given sugar tong splint with arm sling while in ED, conservative therapy recommended and discussed. Patient will be discharged home & is agreeable with above plan. Returns precautions discussed. Pt appears safe for discharge.   I personally performed the services described in this documentation, which was scribed in my presence. The recorded information has been  reviewed and is accurate.     Comer Locket, PA-C 06/14/15 [place], MD 06/14/15 [phone]

## 2015-06-15 ENCOUNTER — Ambulatory Visit: Payer: Self-pay | Admitting: *Deleted

## 2015-06-16 DIAGNOSIS — S52562A Barton's fracture of left radius, initial encounter for closed fracture: Secondary | ICD-10-CM | POA: Diagnosis not present

## 2015-06-16 MED FILL — HYDROCODON-APAP 5-325: 5-325 | 3 days supply | Qty: 30 | Fill #0

## 2015-06-20 ENCOUNTER — Encounter (HOSPITAL_COMMUNITY): Payer: Self-pay | Admitting: *Deleted

## 2015-06-20 NOTE — Progress Notes (Signed)
Pt denies cardiac history, chest pain, sob. Pt is diabetic, last A1C was 7.9 about 3 months ago. She states her fasting blood sugars usually run between 100-120.   Instructed pt to take 1/2 dose of Lantus insulin in the AM (pt usually takes 34 units, will take 17 units). Instructed pt to check her blood sugar every 2 hours tomorrow until she leaves to come here. Instructed her that if blood sugar is less than 70 she can treat it with 4 ounces of clear juice (apple or cranberry) and then recheck her blood sugar 15 minutes later. If it's still below 70, instructed pt to call surgical Short Stay and ask to speak with a nurse. Pt voiced understanding.  Pt's PCP is Lennie Odor, PA at Dr. Marlou Sa Mitchell's office.

## 2015-06-21 ENCOUNTER — Ambulatory Visit (HOSPITAL_COMMUNITY)
Admission: RE | Admit: 2015-06-21 | Discharge: 2015-06-22 | Disposition: A | Discharge status: Home / Self Care | Payer: PRIVATE HEALTH INSURANCE | Source: Ambulatory Visit | Attending: Orthopedic Surgery | Admitting: Orthopedic Surgery

## 2015-06-21 ENCOUNTER — Encounter (HOSPITAL_COMMUNITY): Payer: Self-pay | Admitting: *Deleted

## 2015-06-21 ENCOUNTER — Encounter (HOSPITAL_COMMUNITY)
Admission: RE | Disposition: A | Discharge status: Home / Self Care | Payer: Self-pay | Source: Ambulatory Visit | Attending: Orthopedic Surgery

## 2015-06-21 ENCOUNTER — Ambulatory Visit (HOSPITAL_COMMUNITY): Payer: PRIVATE HEALTH INSURANCE | Admitting: Anesthesiology

## 2015-06-21 DIAGNOSIS — K219 Gastro-esophageal reflux disease without esophagitis: Secondary | ICD-10-CM | POA: Diagnosis not present

## 2015-06-21 DIAGNOSIS — E785 Hyperlipidemia, unspecified: Secondary | ICD-10-CM | POA: Diagnosis not present

## 2015-06-21 DIAGNOSIS — I10 Essential (primary) hypertension: Secondary | ICD-10-CM | POA: Diagnosis not present

## 2015-06-21 DIAGNOSIS — J45909 Unspecified asthma, uncomplicated: Secondary | ICD-10-CM | POA: Insufficient documentation

## 2015-06-21 DIAGNOSIS — W1809XA Striking against other object with subsequent fall, initial encounter: Secondary | ICD-10-CM | POA: Diagnosis not present

## 2015-06-21 DIAGNOSIS — F17210 Nicotine dependence, cigarettes, uncomplicated: Secondary | ICD-10-CM | POA: Diagnosis not present

## 2015-06-21 DIAGNOSIS — E119 Type 2 diabetes mellitus without complications: Secondary | ICD-10-CM | POA: Insufficient documentation

## 2015-06-21 DIAGNOSIS — Z794 Long term (current) use of insulin: Secondary | ICD-10-CM | POA: Diagnosis not present

## 2015-06-21 DIAGNOSIS — Z79899 Other long term (current) drug therapy: Secondary | ICD-10-CM | POA: Diagnosis not present

## 2015-06-21 DIAGNOSIS — S52572A Other intraarticular fracture of lower end of left radius, initial encounter for closed fracture: Secondary | ICD-10-CM | POA: Diagnosis not present

## 2015-06-21 DIAGNOSIS — S52502A Unspecified fracture of the lower end of left radius, initial encounter for closed fracture: Secondary | ICD-10-CM | POA: Diagnosis present

## 2015-06-21 DIAGNOSIS — Z7982 Long term (current) use of aspirin: Secondary | ICD-10-CM | POA: Insufficient documentation

## 2015-06-21 HISTORY — DX: Calculus of kidney: N20.0

## 2015-06-21 HISTORY — DX: Anemia, unspecified: D64.9

## 2015-06-21 HISTORY — PX: OPEN REDUCTION INTERNAL FIXATION (ORIF) DISTAL RADIAL FRACTURE: SHX5989

## 2015-06-21 HISTORY — DX: Unspecified asthma, uncomplicated: J45.909

## 2015-06-21 LAB — CBC
HEMATOCRIT: 38.7 % (ref 36.0–46.0)
Hemoglobin: 12.5 g/dL (ref 12.0–15.0)
MCH: 28.8 pg (ref 26.0–34.0)
MCHC: 32.3 g/dL (ref 30.0–36.0)
MCV: 89.2 fL (ref 78.0–100.0)
PLATELETS: 245 10*3/uL (ref 150–400)
RBC: 4.34 MIL/uL (ref 3.87–5.11)
RDW: 13.8 % (ref 11.5–15.5)
WBC: 12.4 10*3/uL — ABNORMAL HIGH (ref 4.0–10.5)

## 2015-06-21 LAB — BASIC METABOLIC PANEL
Anion gap: 10 (ref 5–15)
BUN: 12 mg/dL (ref 6–20)
CHLORIDE: 106 mmol/L (ref 101–111)
CO2: 23 mmol/L (ref 22–32)
Calcium: 9.3 mg/dL (ref 8.9–10.3)
Creatinine, Ser: 0.76 mg/dL (ref 0.44–1.00)
GFR calc Af Amer: >60 mL/min (ref >60)
GLUCOSE: 107 mg/dL — AB (ref 65–99)
POTASSIUM: 4 mmol/L (ref 3.5–5.1)
Sodium: 139 mmol/L (ref 135–145)

## 2015-06-21 LAB — GLUCOSE, CAPILLARY
Glucose-Capillary: 88 mg/dL (ref 65–99)
Glucose-Capillary: 81 mg/dL (ref 65–99)
Glucose-Capillary: 81 mg/dL (ref 65–99)
Glucose-Capillary: 87 mg/dL (ref 65–99)

## 2015-06-21 SURGERY — OPEN REDUCTION INTERNAL FIXATION (ORIF) DISTAL RADIUS FRACTURE
Anesthesia: General | Laterality: Left

## 2015-06-21 MED ORDER — OXYCODONE-ACETAMINOPHEN 5-325 MG PO TABS
ORAL_TABLET | ORAL | Status: AC
  Filled 2015-06-21: qty 2

## 2015-06-21 MED ORDER — CEFAZOLIN SODIUM-DEXTROSE 2-3 GM-% IV SOLR
2.0000 g | INTRAVENOUS | Status: AC
  Administered 2015-06-21: 2 g via INTRAVENOUS

## 2015-06-21 MED ORDER — ONDANSETRON HCL 4 MG/2ML IJ SOLN: 4.0000 mg | Freq: Four times a day (QID) | INTRAMUSCULAR | Status: DC | PRN

## 2015-06-21 MED ORDER — METHOCARBAMOL 500 MG PO TABS
500.0000 mg | ORAL_TABLET | Freq: Four times a day (QID) | ORAL | Status: DC | PRN
  Administered 2015-06-21 – 2015-06-22 (×2): 500 mg via ORAL
  Filled 2015-06-21: qty 1

## 2015-06-21 MED ORDER — ADULT MULTIVITAMIN W/MINERALS CH
1.0000 | ORAL_TABLET | Freq: Every day | ORAL | Status: DC
  Administered 2015-06-22: 1 via ORAL
  Filled 2015-06-21: qty 1

## 2015-06-21 MED ORDER — HYDROMORPHONE HCL 1 MG/ML IJ SOLN
0.2500 mg | INTRAMUSCULAR | Status: DC | PRN
  Administered 2015-06-21 (×4): 0.5 mg via INTRAVENOUS

## 2015-06-21 MED ORDER — SUCCINYLCHOLINE CHLORIDE 20 MG/ML IJ SOLN
INTRAMUSCULAR | Status: AC
  Filled 2015-06-21: qty 1

## 2015-06-21 MED ORDER — ONDANSETRON HCL 4 MG PO TABS: 4.0000 mg | ORAL_TABLET | Freq: Four times a day (QID) | ORAL | Status: DC | PRN

## 2015-06-21 MED ORDER — METHOCARBAMOL 500 MG PO TABS
ORAL_TABLET | ORAL | Status: AC
  Filled 2015-06-21: qty 1

## 2015-06-21 MED ORDER — HYDROCODONE-ACETAMINOPHEN 5-325 MG PO TABS: 1.0000 | ORAL_TABLET | ORAL | Status: DC | PRN

## 2015-06-21 MED ORDER — LIDOCAINE HCL (CARDIAC) 20 MG/ML IV SOLN
INTRAVENOUS | Status: AC
  Filled 2015-06-21: qty 5

## 2015-06-21 MED ORDER — PANTOPRAZOLE SODIUM 40 MG PO TBEC
40.0000 mg | DELAYED_RELEASE_TABLET | Freq: Every day | ORAL | Status: DC
  Administered 2015-06-22: 40 mg via ORAL
  Filled 2015-06-21: qty 1

## 2015-06-21 MED ORDER — MIDAZOLAM HCL 2 MG/2ML IJ SOLN
INTRAMUSCULAR | Status: AC
  Filled 2015-06-21: qty 2

## 2015-06-21 MED ORDER — METHOCARBAMOL 500 MG PO TABS: 500.0000 mg | ORAL_TABLET | Freq: Four times a day (QID) | ORAL | Status: DC

## 2015-06-21 MED ORDER — VITAMIN C 500 MG PO TABS: 500.0000 mg | ORAL_TABLET | Freq: Every day | ORAL | Status: DC

## 2015-06-21 MED ORDER — FENTANYL CITRATE (PF) 250 MCG/5ML IJ SOLN
INTRAMUSCULAR | Status: AC
  Filled 2015-06-21: qty 5

## 2015-06-21 MED ORDER — PROPOFOL 10 MG/ML IV BOLUS
INTRAVENOUS | Status: AC
  Filled 2015-06-21: qty 20

## 2015-06-21 MED ORDER — NEOSTIGMINE METHYLSULFATE 10 MG/10ML IV SOLN
INTRAVENOUS | Status: DC | PRN
  Administered 2015-06-21: 4 mg via INTRAVENOUS

## 2015-06-21 MED ORDER — HYDROMORPHONE HCL 1 MG/ML IJ SOLN
0.5000 mg | INTRAMUSCULAR | Status: DC | PRN
  Administered 2015-06-22: 1 mg via INTRAVENOUS
  Filled 2015-06-21: qty 1

## 2015-06-21 MED ORDER — LINAGLIPTIN 5 MG PO TABS
5.0000 mg | ORAL_TABLET | Freq: Every day | ORAL | Status: DC
  Administered 2015-06-22: 5 mg via ORAL
  Filled 2015-06-21: qty 1

## 2015-06-21 MED ORDER — FENTANYL CITRATE (PF) 100 MCG/2ML IJ SOLN: 100.0000 ug | Freq: Once | INTRAMUSCULAR | Status: DC

## 2015-06-21 MED ORDER — GLYCOPYRROLATE 0.2 MG/ML IJ SOLN
INTRAMUSCULAR | Status: DC | PRN
  Administered 2015-06-21: 0.6 mg via INTRAVENOUS

## 2015-06-21 MED ORDER — LACTATED RINGERS IV SOLN
INTRAVENOUS | Status: DC
  Administered 2015-06-21 (×3): via INTRAVENOUS

## 2015-06-21 MED ORDER — PROMETHAZINE HCL 25 MG/ML IJ SOLN: 6.2500 mg | INTRAMUSCULAR | Status: DC | PRN

## 2015-06-21 MED ORDER — CEFAZOLIN SODIUM 1-5 GM-% IV SOLN
1.0000 g | Freq: Three times a day (TID) | INTRAVENOUS | Status: DC
  Administered 2015-06-22: 1 g via INTRAVENOUS
  Filled 2015-06-21 (×3): qty 50

## 2015-06-21 MED ORDER — ALPRAZOLAM 0.5 MG PO TABS: 0.5000 mg | ORAL_TABLET | Freq: Four times a day (QID) | ORAL | Status: DC | PRN

## 2015-06-21 MED ORDER — KCL IN DEXTROSE-NACL 20-5-0.45 MEQ/L-%-% IV SOLN
INTRAVENOUS | Status: DC
  Administered 2015-06-21: 23:00:00 via INTRAVENOUS
  Filled 2015-06-21: qty 1000

## 2015-06-21 MED ORDER — MIDAZOLAM HCL 5 MG/5ML IJ SOLN
INTRAMUSCULAR | Status: DC | PRN
  Administered 2015-06-21: 2 mg via INTRAVENOUS

## 2015-06-21 MED ORDER — DOCUSATE SODIUM 100 MG PO CAPS
100.0000 mg | ORAL_CAPSULE | Freq: Two times a day (BID) | ORAL | Status: DC
Start: 2015-06-21 — End: 2016-06-04

## 2015-06-21 MED ORDER — OXYCODONE-ACETAMINOPHEN 5-325 MG PO TABS: 1.0000 | ORAL_TABLET | ORAL | Status: DC | PRN

## 2015-06-21 MED ORDER — FENTANYL CITRATE (PF) 100 MCG/2ML IJ SOLN
INTRAMUSCULAR | Status: AC
  Administered 2015-06-21: 100 ug via INTRAVENOUS
  Filled 2015-06-21: qty 2

## 2015-06-21 MED ORDER — MEPERIDINE HCL 25 MG/ML IJ SOLN: 6.2500 mg | INTRAMUSCULAR | Status: DC | PRN

## 2015-06-21 MED ORDER — BUPIVACAINE-EPINEPHRINE (PF) 0.5% -1:200000 IJ SOLN
INTRAMUSCULAR | Status: DC | PRN
  Administered 2015-06-21: 30 mL

## 2015-06-21 MED ORDER — CEFAZOLIN SODIUM 1-5 GM-% IV SOLN
1.0000 g | INTRAVENOUS | Status: AC
  Administered 2015-06-21: 1 g via INTRAVENOUS
  Filled 2015-06-21: qty 50

## 2015-06-21 MED ORDER — MIDAZOLAM HCL 2 MG/2ML IJ SOLN
INTRAMUSCULAR | Status: AC
  Administered 2015-06-21: 2 mg via INTRAVENOUS
  Filled 2015-06-21: qty 2

## 2015-06-21 MED ORDER — ROCURONIUM BROMIDE 100 MG/10ML IV SOLN
INTRAVENOUS | Status: DC | PRN
  Administered 2015-06-21: 40 mg via INTRAVENOUS

## 2015-06-21 MED ORDER — VITAMIN C 500 MG PO TABS
1000.0000 mg | ORAL_TABLET | Freq: Every day | ORAL | Status: DC
  Administered 2015-06-21 – 2015-06-22 (×2): 1000 mg via ORAL
  Filled 2015-06-21 (×2): qty 2

## 2015-06-21 MED ORDER — METFORMIN HCL 500 MG PO TABS
1000.0000 mg | ORAL_TABLET | Freq: Two times a day (BID) | ORAL | Status: DC
  Administered 2015-06-22: 1000 mg via ORAL
  Filled 2015-06-21: qty 2

## 2015-06-21 MED ORDER — FENTANYL CITRATE (PF) 100 MCG/2ML IJ SOLN
INTRAMUSCULAR | Status: AC
  Filled 2015-06-21: qty 2

## 2015-06-21 MED ORDER — MIDAZOLAM HCL 2 MG/2ML IJ SOLN: 2.0000 mg | Freq: Once | INTRAMUSCULAR | Status: DC

## 2015-06-21 MED ORDER — OXYCODONE-ACETAMINOPHEN 5-325 MG PO TABS
1.0000 | ORAL_TABLET | ORAL | Status: DC | PRN
  Administered 2015-06-21 – 2015-06-22 (×3): 2 via ORAL
  Filled 2015-06-21 (×2): qty 2

## 2015-06-21 MED ORDER — HYDROMORPHONE HCL 1 MG/ML IJ SOLN
INTRAMUSCULAR | Status: AC
  Administered 2015-06-21: 0.5 mg via INTRAVENOUS
  Filled 2015-06-21: qty 1

## 2015-06-21 MED ORDER — ROCURONIUM BROMIDE 50 MG/5ML IV SOLN
INTRAVENOUS | Status: AC
  Filled 2015-06-21: qty 1

## 2015-06-21 MED ORDER — INSULIN ASPART 100 UNIT/ML ~~LOC~~ SOLN: 0.0000 [IU] | SUBCUTANEOUS | Status: DC

## 2015-06-21 MED ORDER — ONDANSETRON HCL 4 MG/2ML IJ SOLN
INTRAMUSCULAR | Status: DC | PRN
  Administered 2015-06-21: 4 mg via INTRAVENOUS

## 2015-06-21 MED ORDER — CHLORHEXIDINE GLUCONATE 4 % EX LIQD: 60.0000 mL | Freq: Once | CUTANEOUS | Status: DC

## 2015-06-21 MED ORDER — PROPOFOL 10 MG/ML IV BOLUS
INTRAVENOUS | Status: DC | PRN
  Administered 2015-06-21: 150 mg via INTRAVENOUS

## 2015-06-21 MED ORDER — INSULIN ASPART 100 UNIT/ML FLEXPEN: 10.0000 [IU] | PEN_INJECTOR | Freq: Every evening | SUBCUTANEOUS | Status: DC

## 2015-06-21 MED ORDER — CEFAZOLIN SODIUM-DEXTROSE 2-3 GM-% IV SOLR
INTRAVENOUS | Status: AC
  Filled 2015-06-21: qty 50

## 2015-06-21 MED ORDER — METHOCARBAMOL 1000 MG/10ML IJ SOLN
500.0000 mg | Freq: Four times a day (QID) | INTRAVENOUS | Status: DC | PRN
  Filled 2015-06-21: qty 5

## 2015-06-21 MED ORDER — LIDOCAINE HCL (CARDIAC) 20 MG/ML IV SOLN
INTRAVENOUS | Status: DC | PRN
  Administered 2015-06-21: 80 mg via INTRATRACHEAL

## 2015-06-21 MED ORDER — SIMVASTATIN 40 MG PO TABS
40.0000 mg | ORAL_TABLET | Freq: Every evening | ORAL | Status: DC
  Administered 2015-06-21: 40 mg via ORAL
  Filled 2015-06-21: qty 1

## 2015-06-21 MED ORDER — DIPHENHYDRAMINE HCL 25 MG PO CAPS: 25.0000 mg | ORAL_CAPSULE | Freq: Four times a day (QID) | ORAL | Status: DC | PRN

## 2015-06-21 MED ORDER — ONDANSETRON HCL 4 MG/2ML IJ SOLN
INTRAMUSCULAR | Status: AC
  Filled 2015-06-21: qty 2

## 2015-06-21 MED ORDER — PROMETHAZINE HCL 25 MG/ML IJ SOLN
INTRAMUSCULAR | Status: AC
  Filled 2015-06-21: qty 1

## 2015-06-21 MED ORDER — SENNOSIDES-DOCUSATE SODIUM 8.6-50 MG PO TABS: 1.0000 | ORAL_TABLET | Freq: Every evening | ORAL | Status: DC | PRN

## 2015-06-21 MED ORDER — LISINOPRIL 10 MG PO TABS
10.0000 mg | ORAL_TABLET | Freq: Every day | ORAL | Status: DC
  Administered 2015-06-21 – 2015-06-22 (×2): 10 mg via ORAL
  Filled 2015-06-21 (×2): qty 1

## 2015-06-21 MED ORDER — IPRATROPIUM BROMIDE 0.06 % NA SOLN
2.0000 | Freq: Four times a day (QID) | NASAL | Status: DC
  Filled 2015-06-21: qty 15

## 2015-06-21 MED ORDER — INSULIN GLARGINE 100 UNIT/ML ~~LOC~~ SOLN
34.0000 [IU] | Freq: Every day | SUBCUTANEOUS | Status: DC
  Administered 2015-06-22: 34 [IU] via SUBCUTANEOUS
  Filled 2015-06-21: qty 0.34

## 2015-06-21 MED ORDER — LACTATED RINGERS IV SOLN: INTRAVENOUS | Status: DC

## 2015-06-21 MED ORDER — 0.9 % SODIUM CHLORIDE (POUR BTL) OPTIME
TOPICAL | Status: DC | PRN
  Administered 2015-06-21: 1000 mL

## 2015-06-21 MED FILL — metFORMIN HCL 1000 MG TABS: 1000 | 90 days supply | Qty: 180 | Fill #0

## 2015-06-21 SURGICAL SUPPLY — 64 items
BANDAGE ACE 3X5.8 VEL STRL LF (GAUZE/BANDAGES/DRESSINGS) ×3 IMPLANT
BANDAGE ELASTIC 3 VELCRO ST LF (GAUZE/BANDAGES/DRESSINGS) ×3 IMPLANT
BANDAGE ELASTIC 4 VELCRO ST LF (GAUZE/BANDAGES/DRESSINGS) ×3 IMPLANT
BIT DRILL 2.2 SS TIBIAL (BIT) ×3 IMPLANT
BLADE SURG ROTATE 9660 (MISCELLANEOUS) IMPLANT
BNDG ESMARK 4X9 LF (GAUZE/BANDAGES/DRESSINGS) ×3 IMPLANT
BNDG GAUZE ELAST 4 BULKY (GAUZE/BANDAGES/DRESSINGS) ×3 IMPLANT
CANISTER SUCTION 2500CC (MISCELLANEOUS) ×3 IMPLANT
CORDS BIPOLAR (ELECTRODE) ×3 IMPLANT
COVER SURGICAL LIGHT HANDLE (MISCELLANEOUS) ×3 IMPLANT
CUFF TOURNIQUET SINGLE 18IN (TOURNIQUET CUFF) ×3 IMPLANT
CUFF TOURNIQUET SINGLE 24IN (TOURNIQUET CUFF) IMPLANT
DECANTER SPIKE VIAL GLASS SM (MISCELLANEOUS) ×3 IMPLANT
DRAPE OEC MINIVIEW 54X84 (DRAPES) ×3 IMPLANT
DRAPE SURG 17X11 SM STRL (DRAPES) ×3 IMPLANT
DRSG ADAPTIC 3X8 NADH LF (GAUZE/BANDAGES/DRESSINGS) ×3 IMPLANT
GAUZE SPONGE 4X4 12PLY STRL (GAUZE/BANDAGES/DRESSINGS) ×3 IMPLANT
GAUZE SPONGE 4X4 16PLY XRAY LF (GAUZE/BANDAGES/DRESSINGS) ×3 IMPLANT
GLOVE BIOGEL PI IND STRL 8.5 (GLOVE) ×1 IMPLANT
GLOVE BIOGEL PI INDICATOR 8.5 (GLOVE) ×2
GLOVE SURG ORTHO 8.0 STRL STRW (GLOVE) ×3 IMPLANT
GOWN STRL REUS W/ TWL LRG LVL3 (GOWN DISPOSABLE) ×1 IMPLANT
GOWN STRL REUS W/ TWL XL LVL3 (GOWN DISPOSABLE) ×1 IMPLANT
GOWN STRL REUS W/TWL LRG LVL3 (GOWN DISPOSABLE) ×2
GOWN STRL REUS W/TWL XL LVL3 (GOWN DISPOSABLE) ×2
K-WIRE 1.6 (WIRE) ×2
K-WIRE FX5X1.6XNS BN SS (WIRE) ×1
KIT BASIN OR (CUSTOM PROCEDURE TRAY) ×3 IMPLANT
KIT ROOM TURNOVER OR (KITS) ×3 IMPLANT
KWIRE FX5X1.6XNS BN SS (WIRE) ×1 IMPLANT
NEEDLE HYPO 25X1 1.5 SAFETY (NEEDLE) ×3 IMPLANT
NS IRRIG 1000ML POUR BTL (IV SOLUTION) ×3 IMPLANT
PACK ORTHO EXTREMITY (CUSTOM PROCEDURE TRAY) ×3 IMPLANT
PAD ARMBOARD 7.5X6 YLW CONV (MISCELLANEOUS) ×6 IMPLANT
PAD CAST 4YDX4 CTTN HI CHSV (CAST SUPPLIES) ×1 IMPLANT
PADDING CAST ABS 3INX4YD NS (CAST SUPPLIES) ×2
PADDING CAST ABS 4INX4YD NS (CAST SUPPLIES) ×2
PADDING CAST ABS COTTON 3X4 (CAST SUPPLIES) ×1 IMPLANT
PADDING CAST ABS COTTON 4X4 ST (CAST SUPPLIES) ×1 IMPLANT
PADDING CAST COTTON 4X4 STRL (CAST SUPPLIES) ×2
PEG LOCKING SMOOTH 2.2X18 (Peg) ×6 IMPLANT
PEG LOCKING SMOOTH 2.2X20 (Screw) ×9 IMPLANT
PEG LOCKING SMOOTH 2.2X22 (Screw) ×6 IMPLANT
PLATE STANDARD DVR LEFT (Plate) ×3 IMPLANT
PLATE STD DVR LT 24X51 (Plate) ×1 IMPLANT
SCREW LOCK 14X2.7X 3 LD TPR (Screw) ×2 IMPLANT
SCREW LOCK 16X2.7X 3 LD TPR (Screw) ×1 IMPLANT
SCREW LOCKING 2.7X14 (Screw) ×4 IMPLANT
SCREW LOCKING 2.7X15MM (Screw) ×6 IMPLANT
SCREW LOCKING 2.7X16 (Screw) ×2 IMPLANT
SOAP 2 % CHG 4 OZ (WOUND CARE) ×3 IMPLANT
SPLINT FIBERGLASS 3X35 (CAST SUPPLIES) ×3 IMPLANT
SPONGE LAP 4X18 X RAY DECT (DISPOSABLE) ×3 IMPLANT
SUT ETHILON 4 0 PS 2 18 (SUTURE) IMPLANT
SUT MNCRL AB 4-0 PS2 18 (SUTURE) IMPLANT
SUT VIC AB 2-0 FS1 27 (SUTURE) IMPLANT
SUT VICRYL 4-0 PS2 18IN ABS (SUTURE) IMPLANT
SYR CONTROL 10ML LL (SYRINGE) IMPLANT
TOWEL OR 17X24 6PK STRL BLUE (TOWEL DISPOSABLE) ×3 IMPLANT
TOWEL OR 17X26 10 PK STRL BLUE (TOWEL DISPOSABLE) ×3 IMPLANT
TUBE CONNECTING 12'X1/4 (SUCTIONS) ×1
TUBE CONNECTING 12X1/4 (SUCTIONS) ×2 IMPLANT
WATER STERILE IRR 1000ML POUR (IV SOLUTION) ×3 IMPLANT
YANKAUER SUCT BULB TIP NO VENT (SUCTIONS) IMPLANT

## 2015-06-21 NOTE — Transfer of Care (Signed)
Immediate Anesthesia Transfer of Care Note  Patient: Brandy Burns  Procedure(s) Performed: Procedure(s): OPEN REDUCTION INTERNAL FIXATION (ORIF) DISTAL RADIAL FRACTURE AND REPAIR AS NEEDED (Left)  Patient Location: PACU  Anesthesia Type:General  Level of Consciousness: awake, alert  and oriented  Airway & Oxygen Therapy: Patient Spontanous Breathing and Patient connected to nasal cannula oxygen  Post-op Assessment: Report given to RN and Post -op Vital signs reviewed and stable  Post vital signs: Reviewed and stable  Last Vitals:  Filed Vitals:   06/21/15 1442 06/21/15 1501  BP: 112/47   Pulse: 76   Temp:  36.7 C  Resp: 16     Complications: No apparent anesthesia complications

## 2015-06-21 NOTE — Anesthesia Procedure Notes (Addendum)
Anesthesia Regional Block:  Axillary brachial plexus block  Pre-Anesthetic Checklist: ,, timeout performed, Correct Patient, Correct Site, Correct Laterality, Correct Procedure, Correct Position, site marked, Risks and benefits discussed,  Surgical consent,  Pre-op evaluation,  At surgeon's request and post-op pain management   Prep: chloraprep       Needles:  Injection technique: Single-shot  Needle Type: Echogenic Needle     Needle Length: 9cm 9 cm Needle Gauge: 21 and 21 G    Additional Needles:  Procedures: ultrasound guided (picture in chart) Axillary brachial plexus block Narrative:  Start time: 06/21/2015 6:55 PM End time: 06/21/2015 6:58 PM Injection made incrementally with aspirations every 5 mL.  Performed by: Personally  Anesthesiologist: Suella Broad D  Additional Notes: No immediate complications noted.    Procedure Name: Intubation Date/Time: 06/21/2015 7:15 PM Performed by: Trixie Deis A Pre-anesthesia Checklist: Patient identified, Emergency Drugs available, Suction available, Patient being monitored and Timeout performed Patient Re-evaluated:Patient Re-evaluated prior to inductionOxygen Delivery Method: Circle system utilized Preoxygenation: Pre-oxygenation with 100% oxygen Intubation Type: IV induction Ventilation: Mask ventilation without difficulty Laryngoscope Size: Mac and 3 Grade View: Grade II Tube type: Oral Tube size: 7.0 mm Number of attempts: 1 Airway Equipment and Method: Stylet Placement Confirmation: ETT inserted through vocal cords under direct vision,  positive ETCO2 and breath sounds checked- equal and bilateral Secured at: 21 cm Tube secured with: Tape Dental Injury: Teeth and Oropharynx as per pre-operative assessment

## 2015-06-21 NOTE — Anesthesia Postprocedure Evaluation (Signed)
Anesthesia Post Note  Patient: Brandy Burns  Procedure(s) Performed: Procedure(s) (LRB): OPEN REDUCTION INTERNAL FIXATION (ORIF) DISTAL RADIAL FRACTURE AND REPAIR AS NEEDED (Left)  Patient location during evaluation: PACU Anesthesia Type: General Level of consciousness: awake Vital Signs Assessment: post-procedure vital signs reviewed and stable Respiratory status: spontaneous breathing Cardiovascular status: stable Anesthetic complications: no    Last Vitals:  Filed Vitals:   06/21/15 1442 06/21/15 1501  BP: 112/47   Pulse: 76   Temp:  36.7 C  Resp: 16     Last Pain: There were no vitals filed for this visit.               EDWARDS,Evin Loiseau

## 2015-06-21 NOTE — Anesthesia Preprocedure Evaluation (Addendum)
Anesthesia Evaluation  Patient identified by MRN, date of birth, ID band Patient awake    Reviewed: Allergy & Precautions, NPO status , Patient's Chart, lab work & pertinent test results  Airway Mallampati: II  TM Distance: >3 FB Neck ROM: Full    Dental  (+) Teeth Intact   Pulmonary asthma , Current Smoker,    breath sounds clear to auscultation       Cardiovascular hypertension, Pt. on medications  Rhythm:Regular Rate:Normal     Neuro/Psych negative neurological ROS  negative psych ROS   GI/Hepatic Neg liver ROS, GERD  Medicated,  Endo/Other  diabetes  Renal/GU Kidney Stones  negative genitourinary   Musculoskeletal negative musculoskeletal ROS (+)   Abdominal   Peds negative pediatric ROS (+)  Hematology  (+) anemia ,   Anesthesia Other Findings   Reproductive/Obstetrics negative OB ROS                            Lab Results  Component Value Date   WBC 12.4* 06/21/2015   HGB 12.5 06/21/2015   HCT 38.7 06/21/2015   MCV 89.2 06/21/2015   PLT 245 06/21/2015   Lab Results  Component Value Date   CREATININE 0.80 03/04/2012   BUN 10 03/04/2012   NA 144 03/04/2012   K 3.6 03/04/2012   CL 107 03/04/2012   No results found for: INR, PROTIME  06/2015 EKG: normal sinus rhythm.   Anesthesia Physical Anesthesia Plan  ASA: II  Anesthesia Plan: General   Post-op Pain Management: GA combined w/ Regional for post-op pain   Induction: Intravenous  Airway Management Planned: Oral ETT  Additional Equipment:   Intra-op Plan:   Post-operative Plan: Extubation in OR  Informed Consent: I have reviewed the patients History and Physical, chart, labs and discussed the procedure including the risks, benefits and alternatives for the proposed anesthesia with the patient or authorized representative who has indicated his/her understanding and acceptance.   Dental advisory given  Plan  Discussed with: CRNA  Anesthesia Plan Comments:         Anesthesia Quick Evaluation

## 2015-06-21 NOTE — Brief Op Note (Signed)
06/21/2015  6:46 PM  PATIENT:  Brandy Burns  55 y.o. female  PRE-OPERATIVE DIAGNOSIS:  LEFT DISTAL RADIUS FRACTURE INTRAARTICULAR  POST-OPERATIVE DIAGNOSIS:  * No post-op diagnosis entered *  PROCEDURE:  Procedure(s): OPEN REDUCTION INTERNAL FIXATION (ORIF) DISTAL RADIAL FRACTURE AND REPAIR AS NEEDED (Left)  SURGEON:  Surgeon(s) and Role:    * Iran Planas, MD - Primary  PHYSICIAN ASSISTANT:   ASSISTANTS: none   ANESTHESIA:   general  EBL:     BLOOD ADMINISTERED:none  DRAINS: none   LOCAL MEDICATIONS USED:  NONE  SPECIMEN:  No Specimen  DISPOSITION OF SPECIMEN:  N/A  COUNTS:  YES  TOURNIQUET:    DICTATION: .Other Dictation: Dictation Number [id number]  PLAN OF CARE: Discharge to home after PACU  PATIENT DISPOSITION:  PACU - hemodynamically stable.   Delay start of Pharmacological VTE agent (>24hrs) due to surgical blood loss or risk of bleeding: not applicable

## 2015-06-21 NOTE — Discharge Instructions (Signed)
KEEP BANDAGE CLEAN AND DRY °CALL OFFICE FOR F/U APPT [phone] in 14 days °DR Faelyn Sigler CELL [phone] °KEEP HAND ELEVATED ABOVE HEART °OK TO APPLY ICE TO OPERATIVE AREA °CONTACT OFFICE IF ANY WORSENING PAIN OR CONCERNS. °

## 2015-06-21 NOTE — Op Note (Signed)
NAMEJOLYNE, OXENDINE NO.:  [id number]  MEDICAL RECORD NO.:  [id number]  LOCATION:  [facility]                         FACILITY:  [facility]  PHYSICIAN:  Linna Hoff IV, M.D.DATE OF BIRTH:  [birth year]  DATE OF PROCEDURE:  06/21/2015 DATE OF DISCHARGE:                              OPERATIVE REPORT   PREOPERATIVE DIAGNOSIS:  Left wrist intra-articular distal radius fracture, 3 or more fragments.  POSTOPERATIVE DIAGNOSIS:  Left wrist intra-articular distal radius fracture, 3 or more fragments.  ATTENDING SURGEON:  Linna Hoff, M.D., who scrubbed and present for the entire procedure.  ASSISTANT SURGEON:  None.  ANESTHESIA:  General via LMA with axillary block.  SURGICAL PROCEDURE: 1. Open treatment of left wrist intra-articular distal radius     fracture, 3 or more fragments. 2. Left wrist brachioradialis tendon release and lengthening. 3. Radiographs 3 views, left wrist.  RADIOGRAPHIC INTERPRETATION:  AP and lateral and oblique views of the wrist did show the volar plate fixation in place.  There was good alignment in both planes.  SURGICAL IMPLANTS:  DVR Crosslock.  SURGICAL INDICATIONS:  Mrs. Stonehouse is a right-hand-dominant female, who sustained a closed distal radius fracture.  The patient was seen and evaluated in the office and given the volar displacement, it is recommended she undergo the above procedure.  Risks, benefits, and alternatives were discussed in detail with the patient.  Signed informed consent was obtained.  Risks include, but not limited to bleeding; infection; damage to nearby nerves, arteries, or tendons; loss of motion of the wrist and digits; incomplete relief of symptoms; and need for further surgical intervention.  DESCRIPTION OF PROCEDURE:  The patient was properly identified in the preop holding area, marked with a permanent marker made on the left wrist to indicate the correct operative site.  The patient was brought back  to the operating room, placed supine on the anesthesia room table. General anesthesia was administered.  The patient previously undergone the block.  A well-padded tourniquet placed on left brachium, sealed with 1000 drape.  The left upper extremity was then prepped and draped in normal sterile fashion.  Time-out was called.  Correct site was identified, and procedure then begun.  Attention then turned to the left wrist.  The limb was then elevated using Esmarch exsanguination and tourniquet insufflated.  Dissection was carried down through the skin and subcutaneous tissue.  The FCR sheath was then opened up.  Going through the floor of the FCR sheath, the FPL was then swept out of the way.  An L-shaped pronator quadratus flap was then elevated.  The brachioradialis was then carefully released and lengthened off the radial styloid.  This exposed an intra-articular fracture of 3 or more fragments.  Careful open reduction was then performed with the volar displaced fragments.  The wound was irrigated, fracture hematoma was evacuated.  The volar plate was then applied.  Plate height was then adjusted with a mini C-arm.  Following this, distal fixation was then carried out with a K-wire.  Plate height was then confirmed.  The oblong screw hole was then placed proximally.  Following this, distal fixation was carried out, from an ulnar to radial direction with the  distal locking pegs.  Following this, shaft fixation was carried out with 2/7 locking and nonlocking screws.  The wound was irrigated.  Final radiographs were then obtained.  The pronator quadratus was then closed with 2-0 Vicryl.  Subcutaneous tissues closed with 4-0 Vicryl.  Skin closed with 4-0 simple Prolene.  Adaptic dressing and sterile compressive bandage then applied.  The tourniquet was deflated with good perfusion in the fingers.  The patient was then placed in a well-padded sugar-tong splint, extubated, and taken to recovery  room in good condition.  POSTPROCEDURAL PLAN:  The patient is admitted overnight for IV antibiotics and pain control, discharge in the morning, seen back in the office in approximately 2 weeks for wound check, suture removal, short- arm cast.  Begin a therapy regimen at the 4-week mark, put in the therapy order at the first postop visit.  Radiographs at each visit.     Melrose Nakayama, M.D.     FWO/MEDQ  D:  06/21/2015  T:  06/21/2015  Job:  [id number]

## 2015-06-21 NOTE — H&P (Signed)
Brandy Burns is an 55 y.o. female.   Chief Complaint: LEFT WRIST FRACTURE HPI: Brandy Burns is a 55 y.o. female with h/o DM, GERD, HLD, essential HTN who presents today for surgery and complaining of moderate left wrist pain. 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 She denies numbness, weakness, paresthesia, additional injuries.   Past Medical History  Diagnosis Date  . GERD (gastroesophageal reflux disease)   . Hyperlipemia   . Essential hypertension 06/16/2014  . Asthma   . Diabetes mellitus     type 2  . Kidney stones   . Anemia     as a child    Past Surgical History  Procedure Laterality Date  . Tonsillectomy    . Tubal ligation    . Ectopic pregnancy surgery    . Colonoscopy  09/23/2011    Procedure: COLONOSCOPY;  Surgeon: Juanita Craver, MD;  Location: [facility];  Service: Endoscopy;  Laterality: N/A;    Family History  Problem Relation Age of Onset  . Hypertension Other   . Hyperlipidemia Other   . Arthritis Other   . COPD Other   . Obesity Other   . COPD Mother   . Diabetes Mother   . Hypertension Mother   . Cancer Father   . Diabetes Brother   . Hypertension Brother    Social History:  reports that she has been smoking Cigarettes.  She has been smoking about 0.50 packs per day for the past 0 years. She has never used smokeless tobacco. She reports that she does not drink alcohol or use illicit drugs.  Allergies:  Allergies  Allergen Reactions  . Sulfa Antibiotics Hives    Medications Prior to Admission  Medication Sig Dispense Refill  . aspirin 81 MG tablet Take 81 mg by mouth daily.    Marland Kitchen HYDROcodone-acetaminophen (NORCO/VICODIN) 5-325 MG tablet Take 1-2 tablets by mouth every 4 (four) hours as needed. (Patient taking differently: Take 1 tablet by mouth every 4 (four) hours as needed for moderate pain. ) 10 tablet 0  . insulin aspart (NOVOLOG  FLEXPEN) 100 UNIT/ML FlexPen Inject 10 Units into the skin every evening.    . insulin glargine (LANTUS) 100 UNIT/ML injection Inject 34 Units into the skin daily.     Marland Kitchen lisinopril (PRINIVIL,ZESTRIL) 10 MG tablet Take 10 mg by mouth daily.    . metFORMIN (GLUCOPHAGE) 1000 MG tablet Take 1,000 mg by mouth 2 (two) times daily with a meal.     . pantoprazole (PROTONIX) 40 MG tablet Take 40 mg by mouth daily.     . simvastatin (ZOCOR) 40 MG tablet Take 40 mg by mouth every evening.    . sitaGLIPtin (JANUVIA) 100 MG tablet Take 100 mg by mouth daily.    Marland Kitchen ipratropium (ATROVENT) 0.06 % nasal spray Place 2 sprays into both nostrils 4 (four) times daily. (Patient not taking: Reported on 06/20/2015) 15 mL 12  . predniSONE (DELTASONE) 20 MG tablet Take 2 tabs day 1 thru 3, 1 tab fourth, fifth and sixth day. Take with food. (Patient not taking: Reported on 06/20/2015) 9 tablet 0  . traMADol (ULTRAM) 50 MG tablet Take 1 tablet (50 mg total) by mouth every 6 (six) hours as needed. (Patient not taking: Reported on 06/20/2015) 15 tablet 0    Results for orders placed or performed during the hospital encounter of 06/21/15 (from the  past 48 hour(s))  CBC     Status: Abnormal   Collection Time: 06/21/15  2:37 PM  Result Value Ref Range   WBC 12.4 (H) 4.0 - 10.5 K/uL   RBC 4.34 3.87 - 5.11 MIL/uL   Hemoglobin 12.5 12.0 - 15.0 g/dL   HCT 38.7 36.0 - 46.0 %   MCV 89.2 78.0 - 100.0 fL   MCH 28.8 26.0 - 34.0 pg   MCHC 32.3 30.0 - 36.0 g/dL   RDW 13.8 11.5 - 15.5 %   Platelets 245 150 - 400 K/uL  Basic metabolic panel     Status: Abnormal   Collection Time: 06/21/15  2:37 PM  Result Value Ref Range   Sodium 139 135 - 145 mmol/L   Potassium 4.0 3.5 - 5.1 mmol/L   Chloride 106 101 - 111 mmol/L   CO2 23 22 - 32 mmol/L   Glucose, Bld 107 (H) 65 - 99 mg/dL   BUN 12 6 - 20 mg/dL   Creatinine, Ser 0.76 0.44 - 1.00 mg/dL   Calcium 9.3 8.9 - 10.3 mg/dL   GFR calc non Af Amer >60 >60 mL/min   GFR calc Af Amer >60  >60 mL/min    Comment: (NOTE) The eGFR has been calculated using the CKD EPI equation. This calculation has not been validated in all clinical situations. eGFR's persistently <60 mL/min signify possible Chronic Kidney Disease.    Anion gap 10 5 - 15  Glucose, capillary     Status: None   Collection Time: 06/21/15  2:48 PM  Result Value Ref Range   Glucose-Capillary 88 65 - 99 mg/dL  Glucose, capillary     Status: None   Collection Time: 06/21/15  4:51 PM  Result Value Ref Range   Glucose-Capillary 81 65 - 99 mg/dL   No results found.  ROSNO RECENT ILLNESSES OR HOSPITALIZATIONS  Blood pressure 112/47, pulse 76, temperature 98 F (36.7 C), resp. rate 16, height 5' 4.5" (1.638 m), weight 88.253 kg (194 lb 9 oz), SpO2 99 %. Physical Exam  General Appearance:  Alert, cooperative, no distress, appears stated age  Head:  Normocephalic, without obvious abnormality, atraumatic  Eyes:  Pupils equal, conjunctiva/corneas clear,         Throat: Lips, mucosa, and tongue normal; teeth and gums normal  Neck: No visible masses     Lungs:   respirations unlabored  Chest Wall:  No tenderness or deformity  Heart:  Regular rate and rhythm,  Abdomen:   Soft, non-tender,         Extremities: LEFT WRIST: SKIN INTACT, FINGERS WARM WELL PERFUSED ABLE TO EXTEND THUMB AND EXTEND FINGERS FINGERS WARM WELL PERFUSED LIMITED WRIST AND DIGITAL MOTION  Pulses: 2+ and symmetric  Skin: Skin color, texture, turgor normal, no rashes or lesions     Neurologic: Normal    Assessment/Plan COMMINUTED LEFT CLOSED DISTAL RADIUS FRACTURE, INTRAARTICULAR  LEFT DISTAL RADIUS OPEN REDUCTION AND INTERNAL FIXATION AND REPAIR AS INDICATED  R/B/A DISCUSSED WITH PT IN OFFICE.  PT VOICED UNDERSTANDING OF PLAN CONSENT SIGNED DAY OF SURGERY PT SEEN AND EXAMINED PRIOR TO OPERATIVE PROCEDURE/DAY OF SURGERY SITE MARKED. QUESTIONS ANSWERED WILL GO HOME FOLLOWING SURGERY  WE ARE PLANNING SURGERY FOR YOUR UPPER  EXTREMITY. THE RISKS AND BENEFITS OF SURGERY INCLUDE BUT NOT LIMITED TO BLEEDING INFECTION, DAMAGE TO NEARBY NERVES ARTERIES TENDONS, FAILURE OF SURGERY TO ACCOMPLISH ITS INTENDED GOALS, PERSISTENT SYMPTOMS AND NEED FOR FURTHER SURGICAL INTERVENTION. WITH THIS IN MIND WE WILL PROCEED. I  HAVE DISCUSSED WITH THE PATIENT THE PRE AND POSTOPERATIVE REGIMEN AND THE DOS AND DON'TS. PT VOICED UNDERSTANDING AND INFORMED CONSENT SIGNED.  Linna Hoff 06/21/2015, 6:44 PM

## 2015-06-22 ENCOUNTER — Encounter (HOSPITAL_COMMUNITY): Payer: Self-pay | Admitting: Orthopedic Surgery

## 2015-06-22 DIAGNOSIS — S52572A Other intraarticular fracture of lower end of left radius, initial encounter for closed fracture: Secondary | ICD-10-CM | POA: Diagnosis not present

## 2015-06-22 LAB — GLUCOSE, CAPILLARY
Glucose-Capillary: 111 mg/dL — ABNORMAL HIGH (ref 65–99)
Glucose-Capillary: 114 mg/dL — ABNORMAL HIGH (ref 65–99)
Glucose-Capillary: 114 mg/dL — ABNORMAL HIGH (ref 65–99)

## 2015-06-22 NOTE — Discharge Summary (Signed)
Physician Discharge Summary  Patient ID: Brandy Burns MRN: [id number] DOB/AGE: 1960/09/24 55 y.o.  Admit date: 06/21/2015 Discharge date: 06/22/2015  Admission Diagnoses: LEFT DISTAL RADIUS FRACTURE INTRAARTICULAR Past Medical History  Diagnosis Date  . GERD (gastroesophageal reflux disease)   . Hyperlipemia   . Essential hypertension 06/16/2014  . Asthma   . Diabetes mellitus     type 2  . Kidney stones   . Anemia     as a child    Discharge Diagnoses:  Active Problems:   Distal radius fracture, left   Surgeries: Procedure(s): OPEN REDUCTION INTERNAL FIXATION (ORIF) DISTAL RADIAL FRACTURE AND REPAIR AS NEEDED on 06/21/2015    Consultants:  none  Discharged Condition: Improved  Hospital Course: Brandy Burns is an 55 y.o. female who was admitted 06/21/2015 with a chief complaint of No chief complaint on file. , and found to have a diagnosis of LEFT DISTAL RADIUS FRACTURE INTRAARTICULAR.  They were brought to the operating room on 06/21/2015 and underwent Procedure(s): OPEN REDUCTION INTERNAL FIXATION (ORIF) DISTAL RADIAL FRACTURE AND REPAIR AS NEEDED.    They were given perioperative antibiotics: Anti-infectives    Start     Dose/Rate Route Frequency Ordered Stop   06/22/15 0400  ceFAZolin (ANCEF) IVPB 1 g/50 mL premix     1 g 100 mL/hr over 30 Minutes Intravenous 3 times per day 06/21/15 2154     06/21/15 2200  ceFAZolin (ANCEF) IVPB 1 g/50 mL premix     1 g 100 mL/hr over 30 Minutes Intravenous NOW 06/21/15 2154 06/21/15 2340   06/21/15 1530  ceFAZolin (ANCEF) IVPB 2 g/50 mL premix     2 g 100 mL/hr over 30 Minutes Intravenous On call to O.R. 06/21/15 1424 06/21/15 1905   06/21/15 1015  ceFAZolin (ANCEF) 2-3 GM-% IVPB SOLR    Comments:  Brandy Burns   : cabinet override      06/21/15 1015 06/21/15 2229    .  They were given sequential compression devices, early ambulation, and Other (comment) for DVT prophylaxis.  Recent vital signs: Patient Vitals for the  past 24 hrs:  BP Temp Temp src Pulse Resp SpO2 Height Weight  06/22/15 0432 (!) 107/58 mmHg 98.4 F (36.9 C) Oral 90 16 94 % - -  06/22/15 0107 (!) 112/57 mmHg 98.3 F (36.8 C) Oral 85 16 95 % - -  06/21/15 2151 114/61 mmHg 97.7 F (36.5 C) Oral 67 17 94 % - -  06/21/15 2130 - - - 80 - 96 % - -  06/21/15 2125 - 97.7 F (36.5 C) - - - - - -  06/21/15 2115 108/72 mmHg - - 83 19 98 % - -  06/21/15 2100 115/62 mmHg - - 78 18 96 % - -  06/21/15 2045 128/66 mmHg - - 74 20 97 % - -  06/21/15 2030 - - - 89 19 97 % - -  06/21/15 2023 135/63 mmHg 98 F (36.7 C) - 98 18 95 % - -  06/21/15 1501 - 98 F (36.7 C) - - - - - -  06/21/15 1442 (!) 112/47 mmHg - Oral 76 16 99 % 5' 4.5" (1.638 m) 88.253 kg (194 lb 9 oz)  .  Recent laboratory studies: No results found.  Discharge Medications:     Medication List    TAKE these medications        aspirin 81 MG tablet  Take 81 mg by mouth daily.     docusate  sodium 100 MG capsule  Commonly known as:  COLACE  Take 1 capsule (100 mg total) by mouth 2 (two) times daily.     HYDROcodone-acetaminophen 5-325 MG tablet  Commonly known as:  NORCO/VICODIN  Take 1-2 tablets by mouth every 4 (four) hours as needed.     insulin glargine 100 UNIT/ML injection  Commonly known as:  LANTUS  Inject 34 Units into the skin daily.     ipratropium 0.06 % nasal spray  Commonly known as:  ATROVENT  Place 2 sprays into both nostrils 4 (four) times daily.     lisinopril 10 MG tablet  Commonly known as:  PRINIVIL,ZESTRIL  Take 10 mg by mouth daily.     metFORMIN 1000 MG tablet  Commonly known as:  GLUCOPHAGE  Take 1,000 mg by mouth 2 (two) times daily with a meal.     methocarbamol 500 MG tablet  Commonly known as:  ROBAXIN  Take 1 tablet (500 mg total) by mouth 4 (four) times daily.     NOVOLOG FLEXPEN 100 UNIT/ML FlexPen  Generic drug:  insulin aspart  Inject 10 Units into the skin every evening.     oxyCODONE-acetaminophen 5-325 MG tablet   Commonly known as:  ROXICET  Take 1 tablet by mouth every 4 (four) hours as needed for severe pain.     pantoprazole 40 MG tablet  Commonly known as:  PROTONIX  Take 40 mg by mouth daily.     predniSONE 20 MG tablet  Commonly known as:  DELTASONE  Take 2 tabs day 1 thru 3, 1 tab fourth, fifth and sixth day. Take with food.     simvastatin 40 MG tablet  Commonly known as:  ZOCOR  Take 40 mg by mouth every evening.     sitaGLIPtin 100 MG tablet  Commonly known as:  JANUVIA  Take 100 mg by mouth daily.     traMADol 50 MG tablet  Commonly known as:  ULTRAM  Take 1 tablet (50 mg total) by mouth every 6 (six) hours as needed.     vitamin C 500 MG tablet  Commonly known as:  ASCORBIC ACID  Take 1 tablet (500 mg total) by mouth daily.        Diagnostic Studies: Dg Wrist Complete Left  2015-07-13  CLINICAL DATA:  Status post fall on an outstretched hand when the patient tripped over a curb at today. Pain. Initial encounter. EXAM: LEFT WRIST - COMPLETE 3+ VIEW COMPARISON:  None. FINDINGS: The patient has a fracture of the distal metaphysis of the radius. The fracture is slightly impacted and demonstrates very mild volar angulation. No definite disruption of the articular surface is identified. No other acute bony or joint abnormality is seen. Soft tissue swelling about the wrist is noted. IMPRESSION: Minimally impacted and angulated distal metaphysis fracture left radius with associated soft tissue swelling. Electronically Signed   By: Inge Rise M.D.   On: 2015/07/13 13:46    They benefited maximally from their hospital stay and there were no complications.     Disposition: 01-Home or Self Care     Signed: Linna Hoff 06/22/2015, 10:12 AM

## 2015-06-22 NOTE — Progress Notes (Signed)
Pt stable for d/c home per MD. No therapy ordered, no equipment needs-has personal sling she will use. Discharge instructions and prescriptions reviewed with pt and her husband, they denied questions.   [place], Jerry Caras

## 2015-06-23 MED FILL — LANTUS SOLOSTAR 100 UNITS/M: 100 | 46 days supply | Qty: 15 | Fill #0

## 2015-06-23 MED FILL — NOVOLOG FLEXPEN SYRINGE: 100 | 90 days supply | Qty: 15 | Fill #0

## 2015-06-30 MED FILL — PANTOPRAZOLE SOD DR 40 MG T: 40 | 90 days supply | Qty: 90 | Fill #1

## 2015-07-03 DIAGNOSIS — Z7984 Long term (current) use of oral hypoglycemic drugs: Secondary | ICD-10-CM | POA: Diagnosis not present

## 2015-07-03 DIAGNOSIS — Z794 Long term (current) use of insulin: Secondary | ICD-10-CM | POA: Diagnosis not present

## 2015-07-03 DIAGNOSIS — E1165 Type 2 diabetes mellitus with hyperglycemia: Secondary | ICD-10-CM | POA: Diagnosis not present

## 2015-07-18 ENCOUNTER — Ambulatory Visit: Payer: PRIVATE HEALTH INSURANCE | Attending: Orthopedic Surgery | Admitting: *Deleted

## 2015-07-18 DIAGNOSIS — M6281 Muscle weakness (generalized): Secondary | ICD-10-CM | POA: Insufficient documentation

## 2015-07-18 DIAGNOSIS — R29898 Other symptoms and signs involving the musculoskeletal system: Secondary | ICD-10-CM

## 2015-07-18 DIAGNOSIS — M25532 Pain in left wrist: Secondary | ICD-10-CM | POA: Diagnosis present

## 2015-07-18 DIAGNOSIS — R279 Unspecified lack of coordination: Secondary | ICD-10-CM | POA: Insufficient documentation

## 2015-07-18 DIAGNOSIS — R278 Other lack of coordination: Secondary | ICD-10-CM

## 2015-07-18 DIAGNOSIS — M25639 Stiffness of unspecified wrist, not elsewhere classified: Secondary | ICD-10-CM

## 2015-07-18 DIAGNOSIS — R531 Weakness: Secondary | ICD-10-CM

## 2015-07-18 NOTE — Therapy (Signed)
[facility] [address] [place], [place], [address] Phone: [phone]   Fax:  [phone]  Occupational Therapy Evaluation  Patient Details  Name: Brandy Burns MRN: [id number] Date of Birth: 22-Nov-1960 Referring Provider: Iran Planas  Encounter Date: 07/18/2015      OT End of Session - 07/18/15 1328    Visit Number 1   Number of Visits 8   Date for OT Re-Evaluation 09/12/15   Authorization Type UMR - autorized 8 visits    Authorization - Visit Number 1   Authorization - Number of Visits 8   OT Start Time [id number]   OT Stop Time 1320   OT Time Calculation (min) 50 min   Equipment Utilized During Treatment 9-hole peg test, box-n-blocks, dynometer, goniometer    Activity Tolerance Patient tolerated treatment well   Behavior During Therapy [facility] for tasks assessed/performed      Past Medical History  Diagnosis Date  . GERD (gastroesophageal reflux disease)   . Hyperlipemia   . Essential hypertension 06/16/2014  . Asthma   . Diabetes mellitus     type 2  . Kidney stones   . Anemia     as a child    Past Surgical History  Procedure Laterality Date  . Tonsillectomy    . Tubal ligation    . Ectopic pregnancy surgery    . Colonoscopy  09/23/2011    Procedure: COLONOSCOPY;  Surgeon: Juanita Craver, MD;  Location: [facility];  Service: Endoscopy;  Laterality: N/A;  . Open reduction internal fixation (orif) distal radial fracture Left 06/21/2015    Procedure: OPEN REDUCTION INTERNAL FIXATION (ORIF) DISTAL RADIAL FRACTURE AND REPAIR AS NEEDED;  Surgeon: Iran Planas, MD;  Location: [facility];  Service: Orthopedics;  Laterality: Left;    There were no vitals filed for this visit.  Visit Diagnosis:  Decreased grip strength of left hand - Plan: Ot plan of care cert/re-cert  Decreased ROM of wrist - Plan: Ot plan of care cert/re-cert  Decreased coordination - Plan: Ot plan of care cert/re-cert  Decreased strength -  Plan: Ot plan of care cert/re-cert  Pain, joint, forearm, left - Plan: Ot plan of care cert/re-cert      Subjective Assessment - 07/18/15 1237    Subjective  I want to go back to work! 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    Pertinent History see epic   Patient Stated Goals Get my hand working so I can go back to work   Currently in Pain? No/denies  Pt states she took some oxycodone prior to OPOT eval           [facility] OT Assessment - 07/18/15 0001    Assessment   Diagnosis Open reduction internal fixation (ORIF) distal radial fracture   Referring Provider Iran Planas   Onset Date 06/14/15  06/14/15 initial fall, 06/21/15 surgery   Prior Therapy none   Precautions   Precautions None   Required Braces or Orthoses Other Brace/Splint   Other Brace/Splint wears pre-fab wrist brace prn for comfort - no specific orders   Restrictions   Weight Bearing Restrictions No   Balance Screen   Has the patient fallen in the past 6 months No  not besides fall at work which resulted in fracture   [place] expects to be discharged to: Private residence   Living Arrangements Spouse/significant other  and children   Available Help at Discharge Family  Type of Home House   Home Access Level entry   Bathroom Shower/Tub Tub/Shower unit   Shower/tub characteristics Forensic psychologist   Prior Function   Level of Independence Independent   Vocation Full time employment   Vocation Requirements heavy manual labor   ADL   Eating/Feeding Independent   Grooming Independent   Upper Body Bathing Minimal assistance   Lower Body Bathing Independent   Upper Body Dressing Minimal assistance   Upper Body Dressing Details needs assistance with bra   Lower Body Dressing Minimal assistance  needs some assistance with pulling up pants, no jeans   Toilet Tranfer Independent   Toileting - Clothing Manipulation Minimal assistance  pulling pants up    Tub/Shower Transfer Independent   ADL comments Pt states she does not wear her normal clothing secondary to decreased independence with LB ADLs. Pt wears slip on shoes and does not have to tie her shoes.    IADL   Shopping Takes care of all shopping needs independently   Light Housekeeping Does personal laundry completely   Meal Prep Plans, prepares and serves adequate meals independently   Mobility   Mobility Status Independent   Written Expression   Dominant Hand Right   Handwriting 100% legible   Vision - History   Baseline Vision No visual deficits   Vision Assessment   Eye Alignment Within Functional Limits   Activity Tolerance   Activity Tolerance Endurance does not limit participation in activity   Cognition   Overall Cognitive Status Within Functional Limits for tasks assessed   Sensation   Light Touch Appears Intact  however, pt with reports of tingling at tip of thumb   Additional Comments Pt commented, "just sore" during testing   Coordination   Gross Motor Movements are Fluid and Coordinated No  secondary to decreased strength, increased edema   Fine Motor Movements are Fluid and Coordinated No  secondary to decreased strength, increased edema   9 Hole Peg Test Left   Left 9 Hole Peg Test 1 minute 24 seconds    Box and Blocks 41   Perception   Perception Within Functional Limits   Praxis   Praxis Intact   Edema   Edema mild to medium edema noted in forearm to fingertips in left hand   ROM / Strength   AROM / PROM / Strength AROM;PROM;Strength   AROM   Overall AROM  Deficits   AROM Assessment Site Forearm;Wrist;Finger   Right/Left Forearm Left   Left Forearm Pronation 65 Degrees   Left Forearm Supination 70 Degrees   Right/Left Wrist Left   Left Wrist Extension 15 Degrees   Left Wrist Flexion 30 Degrees   Left Wrist Radial Deviation 10 Degrees   Left Wrist Ulnar Deviation 15 Degrees   PROM   Overall PROM Comments decreased overal PROM in forearm, wrist,  and fingers   Hand Function   Left Hand Gross Grasp Impaired   Left Hand Grip (lbs) 1lb    Left Hand Lateral Pinch 6 lbs   Left 3 point pinch 3 lbs         Treatment:  Educated pt on left UE basic/initial HEP:  Wrist flexion/extension, ulnar/radial deviation, finger flexion/extension, digit composition, and scar mobilization (5 minutes 3 X per day). For all exercises, except scar mobilization, encouraged pt to complete up to 6 times per day for 10 minute sessions. Also had patient use theraputty for grip strength and digit composition strengthening. Theraputty was a  bit difficult for patient at this time, but hopeful by next session she will be able to tolerate this better (did not administer theraputty for patient to take home yet). See below for education provided to patient.              OT Education - 07/18/15 1327    Education provided Yes   Education Details Initial HEP, encouragement to functionally use L hand more, education on elevation and movement to decrease swelling   Person(s) Educated Patient   Methods Explanation;Demonstration   Comprehension Verbalized understanding;Need further instruction          OT Short Term Goals - 07/18/15 1335    OT SHORT TERM GOAL #1   Title Pt will be independent with initial HEP   Time 4   Period Weeks   Status New   OT SHORT TERM GOAL #2   Title Pt's 9-hole peg test will decrease by 20 seconds   Baseline 1 minute 24 seconds   Time 4   Period Weeks   Status New   OT SHORT TERM GOAL #3   Title Pt's left wrist extension AROM will improve to 40*   Baseline 15*   Time 4   Period Weeks   Status New   OT SHORT TERM GOAL #4   Title Pt's forearm supination AROM will improve to 80*   Baseline 70*   Time 4   Period Weeks   Status New   OT SHORT TERM GOAL #5   Title Pt will be educated on ways to increase UB/LB bathing and dressing tasks in order to increase independence with ADLs    Baseline min assist with UB/LB ADLs    Time 4   Period Weeks   Status New           OT Long Term Goals - 07/18/15 1349    OT LONG TERM GOAL #1   Title Pt will be independent with modified HEP   Time 8   Period Weeks   Status New   OT LONG TERM GOAL #2   Title Pt will be able to lift up to 40lb object using bilateral UEs in order to go back to work soon   Time 8   Period Weeks   Status New   OT LONG TERM GOAL #3   Title Patient will complete 9-hole peg test in less than 80 seconds   Baseline 1 minute 24 seconds   Time 8   Period Weeks   Status New   OT LONG TERM GOAL #4   Title Pt will be independent with UB/LB ADLs   Time 8   Period Weeks   Status New   OT LONG TERM GOAL #5   Title Pt's left wrist extension AROM will improve to 60*   Baseline 15*   Time 8   Period Weeks   Status New               Plan - 07/18/15 1331    Clinical Impression Statement 55 yo female s/p fall resulting in left distal radius fracture intraarticular on 06/14/15. Pt underwent open reduction internal fixation (ORIF) on 06/21/15. Pt with two post op visits, one with cast placement and other one with cast removal. MD placed patient in wrist brace and pt states she is to wear brace prn for comfort, no specific orders on the wearing schedule.    Pt will benefit from skilled therapeutic intervention in order to improve on the  following deficits (Retired) Decreased coordination;Decreased range of motion;Decreased skin integrity;Decreased scar mobility;Decreased strength;Increased edema;Impaired sensation;Impaired UE functional use;Improper body mechanics;Pain   Rehab Potential Good   Clinical Impairments Affecting Rehab Potential none known at this time   OT Frequency 1x / week   OT Duration 8 weeks   OT Treatment/Interventions Self-care/ADL training;Moist Heat;Traction;Ultrasound;Iontophoresis;Parrafin;Fluidtherapy;Contrast Bath;Therapeutic exercise;Manual Therapy;Scar mobilization;Passive range of motion;Splinting;Therapeutic  exercises;Therapeutic activities;Patient/family education   Plan Review initial HEP, please add to HEP - maybe putty? (gave basic exercises to start with). ROM and strength training to L arm   Consulted and Agree with Plan of Care Patient        Problem List Patient Active Problem List   Diagnosis Date Noted  . Distal radius fracture, left 06/21/2015  . Hyperlipidemia 06/16/2014  . Essential hypertension 06/16/2014  . Diabetes mellitus type 2, insulin dependent ([place]) 03/24/2014    [name] , MS, OTR/L, CLT Pager: [phone]  07/18/2015, 2:02 PM  [facility] [address] [place], [place], [address] Phone: [phone]   Fax:  [phone]  Name: CECE CHRISTOPHE MRN: [id number] Date of Birth: [birth year]

## 2015-07-20 ENCOUNTER — Other Ambulatory Visit: Payer: Self-pay | Admitting: *Deleted

## 2015-07-21 ENCOUNTER — Encounter: Payer: Self-pay | Admitting: *Deleted

## 2015-07-21 NOTE — Patient Outreach (Signed)
[facility]) Care Management   07/20/15  Brandy Burns 08/03/1960 [id number]  Brandy Burns is an 55 y.o. female who presents to the [place] Management office for routine Link To Wellness follow up for self management assistance with Type II DM, HTN and hyperlipidemia.  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 She says she is currently in OP physical therapy and wears a wrist brace . She says her surgeon tells her she will likely be out of work for several more weeks. She says she still has considerable pain, and sensation of pulling in the operative area. She says she saw her primary care on 03/28/15 for her annual wellness exam and says she is up to date on her health maintenance tests.  Objective:   Review of Systems  Constitutional: Negative.   Wears a left wrist brace.  Physical Exam  Constitutional: She is oriented to person, place, and time. She appears well-developed and well-nourished.  Respiratory: Effort normal.  Neurological: She is alert and oriented to person, place, and time.  Skin: Skin is warm and dry.     Psychiatric: She has a normal mood and affect. Her behavior is normal. Judgment and thought content normal.   Filed Weights   07/20/15 1540  Weight: 197 lb 12.8 oz (89.721 kg)   Filed Vitals:   07/20/15 1540  BP: 110/70   Current Medications:   Current Outpatient Prescriptions  Medication Sig Dispense Refill  . aspirin 81 MG tablet Take 81 mg by mouth daily.    . insulin aspart (NOVOLOG FLEXPEN) 100 UNIT/ML FlexPen Inject 10 Units into the skin every evening.    . insulin glargine (LANTUS) 100 UNIT/ML injection Inject 34 Units into the skin daily.     [place] lisinopril (PRINIVIL,ZESTRIL) 10 MG tablet Take 10 mg by mouth daily.    . metFORMIN  (GLUCOPHAGE) 1000 MG tablet Take 1,000 mg by mouth 2 (two) times daily with a meal.     . oxyCODONE-acetaminophen (ROXICET) 5-325 MG tablet Take 1 tablet by mouth every 4 (four) hours as needed for severe pain. 45 tablet 0  . pantoprazole (PROTONIX) 40 MG tablet Take 40 mg by mouth daily.     . simvastatin (ZOCOR) 40 MG tablet Take 40 mg by mouth every evening.    . sitaGLIPtin (JANUVIA) 100 MG tablet Take 100 mg by mouth daily.    . vitamin C (ASCORBIC ACID) 500 MG tablet Take 1 tablet (500 mg total) by mouth daily. 50 tablet 0  . docusate sodium (COLACE) 100 MG capsule Take 1 capsule (100 mg total) by mouth 2 (two) times daily. (Patient not taking: Reported on 07/20/2015) 30 capsule 0  . HYDROcodone-acetaminophen (NORCO/VICODIN) 5-325 MG tablet Take 1-2 tablets by mouth every 4 (four) hours as needed. (Patient not taking: Reported on 07/18/2015) 10 tablet 0  . ipratropium (ATROVENT) 0.06 % nasal spray Place 2 sprays into both nostrils 4 (four) times daily. (Patient not taking: Reported on 06/20/2015) 15 mL 12  . methocarbamol (ROBAXIN) 500 MG tablet Take 1 tablet (500 mg total) by mouth 4 (four) times daily. (Patient not taking: Reported on 07/18/2015) 30 tablet 0  . predniSONE (DELTASONE) 20 MG tablet Take 2 tabs day 1 thru 3, 1 tab fourth, fifth and sixth day. Take with  food. (Patient not taking: Reported on 06/20/2015) 9 tablet 0  . traMADol (ULTRAM) 50 MG tablet Take 1 tablet (50 mg total) by mouth every 6 (six) hours as needed. (Patient not taking: Reported on 06/20/2015) 15 tablet 0   No current facility-administered medications for this visit.    Functional Status:   In your present state of health, do you have any difficulty performing the following activities: 07/20/2015 06/21/2015  Hearing? N N  Vision? N N  Difficulty concentrating or making decisions? N N  Walking or climbing stairs? N N  Dressing or bathing? N N  Doing errands, shopping? N -    Fall/Depression Screening:    PHQ 2/9  Scores 07/20/2015 02/16/2015  PHQ - 2 Score 0 0    Assessment:   [employer] employee and Link To Wellness member currently on medical leave of absence due to left wrist injury, with Type II DM, HTN and hyperlipidemia with most recent Hgb A1C= 7.2% (improved from 7.9%) and meeting treatment targets for HTN and hyperlipidemia.   Plan:  [facility] CM Care Plan Problem One        Most Recent Value   Care Plan Problem One  Type II DM, HTN and hyperlipidemia meeting all treatment targets except Hgb A1C= 7.2% on 07/03/15   Role Documenting the Problem One  Care Management [facility] for Problem One  Active   THN Long Term Goal (31-90 days)  improved glycemic control as evidenced by Hgb A1C <7.0% at next assessment and ongoing good control of HTN and hyperlipidemia as evidenced by BP readings <140/<90 and normal lipid profile at next assessment    THN Long Term Goal Start Date  07/20/15   Interventions for Problem One Long Term Goal  discussed changes to health history since last visit and discussed recovery from fall at work on 06/14/15 resulting in a broken wrist and surgery on 06/21/15, assessed pain and pain management strategies,  reviewed medications and assessed medication, discussed mechanism of action, onset, peak and duration, and common side effects of novolog and lantus insulin, reviewed Metformin's mechanism of action and common side effects, reviewed frequency of blood sugar monitoring and blood sugar log history and reviewed pre and post meal blood sugar targets, reviewed 24 hour dietary intake and estimated CHO intake,discussed health maintenance and encouraged her to ask about bone density test at her next appointment with her primary care provider, arranged for Link To Wellness follow up in May        RNCM to fax today's office visit note to [place] will meet quarterly and as needed with patient per Link To Wellness program guidelines to assist with Type II DM, HTN and  hyperlipidemia self-management and assess patient's progress toward mutually set goals. Barrington Ellison RN,CCM,CDE [facility] Management Coordinator Link To Wellness Office Phone [phone] Office Fax [phone]

## 2015-07-25 ENCOUNTER — Ambulatory Visit: Payer: PRIVATE HEALTH INSURANCE | Attending: Orthopedic Surgery | Admitting: *Deleted

## 2015-07-25 DIAGNOSIS — M6281 Muscle weakness (generalized): Secondary | ICD-10-CM | POA: Insufficient documentation

## 2015-07-25 DIAGNOSIS — M25639 Stiffness of unspecified wrist, not elsewhere classified: Secondary | ICD-10-CM

## 2015-07-25 DIAGNOSIS — M25532 Pain in left wrist: Secondary | ICD-10-CM | POA: Insufficient documentation

## 2015-07-25 DIAGNOSIS — R29898 Other symptoms and signs involving the musculoskeletal system: Secondary | ICD-10-CM | POA: Diagnosis present

## 2015-07-25 DIAGNOSIS — R279 Unspecified lack of coordination: Secondary | ICD-10-CM | POA: Insufficient documentation

## 2015-07-25 DIAGNOSIS — R531 Weakness: Secondary | ICD-10-CM

## 2015-07-25 DIAGNOSIS — R278 Other lack of coordination: Secondary | ICD-10-CM

## 2015-07-25 NOTE — Therapy (Signed)
[facility] [address] [place], [place], [address] Phone: [phone]   Fax:  [phone]  Occupational Therapy Treatment  Patient Details  Name: Brandy Burns MRN: [id number] Date of Birth: [birth year] Referring Provider: Iran Planas  Encounter Date: 07/25/2015      OT End of Session - 07/25/15 0905    Visit Number 2  including evaluation   Number of Visits 8   Date for OT Re-Evaluation 09/12/15   Authorization Type UMR - autorized 8 visits    Authorization - Visit Number 2  including evaluation   Authorization - Number of Visits 8   OT Start Time 0805   OT Stop Time 0850   OT Time Calculation (min) 45 min   Activity Tolerance Patient tolerated treatment well   Behavior During Therapy [facility] for tasks assessed/performed      Past Medical History  Diagnosis Date  . GERD (gastroesophageal reflux disease)   . Hyperlipemia   . Essential hypertension 06/16/2014  . Asthma   . Diabetes mellitus     type 2  . Kidney stones   . Anemia     as a child    Past Surgical History  Procedure Laterality Date  . Tonsillectomy    . Tubal ligation    . Ectopic pregnancy surgery    . Colonoscopy  09/23/2011    Procedure: COLONOSCOPY;  Surgeon: Juanita Craver, MD;  Location: [facility];  Service: Endoscopy;  Laterality: N/A;  . Open reduction internal fixation (orif) distal radial fracture Left 06/21/2015    Procedure: OPEN REDUCTION INTERNAL FIXATION (ORIF) DISTAL RADIAL FRACTURE AND REPAIR AS NEEDED;  Surgeon: Iran Planas, MD;  Location: [facility];  Service: Orthopedics;  Laterality: Left;    There were no vitals filed for this visit.  Visit Diagnosis:  Decreased grip strength of left hand  Decreased ROM of wrist  Decreased coordination  Decreased strength  Pain, joint, forearm, left      Subjective Assessment - 07/25/15 0813    Subjective  I woke up yeterday morning at 3 am with my hand cramping and hurting  really bad   Pertinent History see epic   Patient Stated Goals decrease pain and get my hand working    Currently in Pain? Yes   Pain Score 4    Pain Location Wrist   Pain Orientation Right;Left   Pain Descriptors / Indicators Aching;Sore   Pain Type Acute pain   Pain Onset More than a month ago   Pain Frequency Intermittent   Aggravating Factors  pt unsure   Pain Relieving Factors pt unsure   Effect of Pain on Daily Activities sleep, ADLs         Treatment:  Fluidtherapy for 7 minutes to help with pain management.  PROM/stretch: supination/pronation, wrist flexion/extension. Traction: supination/pronation, wrist flexion/extension, ulnar & radial deviation. Supination pronation wheel and weighted object, encouraged pt to incorporate this into her HEP. Pt used wrist maze device to help increase ROM and decrease pain in forearm and wrist. To work on strengthening grip, pt used 15lb gripper to pick up small blocks with minimal dropping due to difficulty maintaining grip strength, break needed about half-way through this exercise. Using yellow putty, pt took 10 small pegs out (focusing on coordination and ROM/strength in hand) and then worked on grip strength by squeezing yellow putty. Educated pt on importance of NOT elevating shoulder during activities and tasks, encouraged patient to depress shoulder and relax left arm in general (  as pt tends to guard left arm).    HEP: wrist flexion/extension, ulnar/radial deviation, digital composition, opening/closing hand to make a fist, supination & pronation using hammer for weight (added this session).                        OT Education - 07/25/15 0856    Education provided Yes   Education Details not using compensatory strategies during functional use of L hand, supination/pronation exercise along with HEP taught last session   Person(s) Educated Patient   Methods Explanation;Demonstration   Comprehension Verbalized  understanding;Returned demonstration;Need further instruction          OT Short Term Goals - 07/25/15 0904    OT SHORT TERM GOAL #1   Title Pt will be independent with initial HEP   Time 4   Period Weeks   Status On-going   OT SHORT TERM GOAL #2   Title Pt's 9-hole peg test will decrease by 20 seconds   Baseline 1 minute 24 seconds   Time 4   Period Weeks   Status On-going   OT SHORT TERM GOAL #3   Title Pt's left wrist extension AROM will improve to 40*   Baseline 15*   Time 4   Period Weeks   Status On-going   OT SHORT TERM GOAL #4   Title Pt's forearm supination AROM will improve to 80*   Baseline 70*   Time 4   Period Weeks   Status On-going   OT SHORT TERM GOAL #5   Title Pt will be educated on ways to increase UB/LB bathing and dressing tasks in order to increase independence with ADLs    Baseline min assist with UB/LB ADLs   Time 4   Period Weeks   Status On-going           OT Long Term Goals - 07/25/15 0905    OT LONG TERM GOAL #1   Title Pt will be independent with modified HEP   Time 8   Period Weeks   Status On-going   OT LONG TERM GOAL #2   Title Pt will be able to lift up to 40lb object using bilateral UEs in order to go back to work soon   Time 8   Period Weeks   Status On-going   OT LONG TERM GOAL #3   Title Patient will complete 9-hole peg test in less than 80 seconds   Baseline 1 minute 24 seconds   Time 8   Period Weeks   Status On-going   OT LONG TERM GOAL #4   Title Pt will be independent with UB/LB ADLs   Time 8   Period Weeks   Status On-going   OT LONG TERM GOAL #5   Title Pt's left wrist extension AROM will improve to 60*   Baseline 15*   Time 8   Period Weeks   Status On-going               Plan - 07/25/15 0859    Clinical Impression Statement Pt with increased complaints of pain since last session, pt unsure of what makes pain better or worse. Pt with decreased swelling in LUE and with increased composition ROM  and finger flexion ROM to increase making fist with left hand.    Pt will benefit from skilled therapeutic intervention in order to improve on the following deficits (Retired) Decreased coordination;Decreased range of motion;Decreased skin integrity;Decreased scar mobility;Decreased strength;Increased edema;Impaired  sensation;Impaired UE functional use;Improper body mechanics;Pain   Rehab Potential Good   Clinical Impairments Affecting Rehab Potential pain   OT Frequency 1x / week   OT Duration 8 weeks   OT Treatment/Interventions Self-care/ADL training;Moist Heat;Traction;Ultrasound;Iontophoresis;Parrafin;Fluidtherapy;Contrast Bath;Therapeutic exercise;Manual Therapy;Scar mobilization;Passive range of motion;Splinting;Therapeutic exercises;Therapeutic activities;Patient/family education   Plan fluidtherapy, ROM to left wrist and forearm, traction, pain management, strengthening    Consulted and Agree with Plan of Care Patient        Problem List Patient Active Problem List   Diagnosis Date Noted  . Distal radius fracture, left 06/21/2015  . Hyperlipidemia 06/16/2014  . Essential hypertension 06/16/2014  . Diabetes mellitus type 2, insulin dependent ([place]) 03/24/2014    Brandy Burns , MS, OTR/L, CLT 07/25/2015, 9:09 AM  [facility] [address] [place], [place], [address] Phone: [phone]   Fax:  [phone]  Name: Brandy Burns MRN: [id number] Date of Birth: 23-Jun-1960

## 2015-08-01 ENCOUNTER — Ambulatory Visit: Payer: PRIVATE HEALTH INSURANCE | Attending: Family Medicine | Admitting: Occupational Therapy

## 2015-08-01 ENCOUNTER — Encounter: Payer: Self-pay | Admitting: Occupational Therapy

## 2015-08-01 DIAGNOSIS — R29898 Other symptoms and signs involving the musculoskeletal system: Secondary | ICD-10-CM | POA: Insufficient documentation

## 2015-08-01 DIAGNOSIS — M25532 Pain in left wrist: Secondary | ICD-10-CM | POA: Insufficient documentation

## 2015-08-01 DIAGNOSIS — M25639 Stiffness of unspecified wrist, not elsewhere classified: Secondary | ICD-10-CM

## 2015-08-01 NOTE — Therapy (Signed)
[facility] [address] [place], [place], [address] Phone: [phone]   Fax:  [phone]  Occupational Therapy Treatment  Patient Details  Name: Brandy Burns MRN: [id number] Date of Birth: 30-Jun-1960 Referring Provider: Iran Planas  Encounter Date: 08/01/2015      OT End of Session - 08/01/15 0855    Visit Number 3   Number of Visits 8   Date for OT Re-Evaluation 09/12/15   Authorization Type UMR - autorized 8 visits    Authorization - Visit Number 3   Authorization - Number of Visits 8   OT Start Time 0800   OT Stop Time 0845   OT Time Calculation (min) 45 min   Activity Tolerance Patient tolerated treatment well      Past Medical History  Diagnosis Date  . GERD (gastroesophageal reflux disease)   . Hyperlipemia   . Essential hypertension 06/16/2014  . Asthma   . Diabetes mellitus     type 2  . Kidney stones   . Anemia     as a child    Past Surgical History  Procedure Laterality Date  . Tonsillectomy    . Tubal ligation    . Ectopic pregnancy surgery    . Colonoscopy  09/23/2011    Procedure: COLONOSCOPY;  Surgeon: Juanita Craver, MD;  Location: [facility];  Service: Endoscopy;  Laterality: N/A;  . Open reduction internal fixation (orif) distal radial fracture Left 06/21/2015    Procedure: OPEN REDUCTION INTERNAL FIXATION (ORIF) DISTAL RADIAL FRACTURE AND REPAIR AS NEEDED;  Surgeon: Iran Planas, MD;  Location: [facility];  Service: Orthopedics;  Laterality: Left;    There were no vitals filed for this visit.  Visit Diagnosis:  Decreased ROM of wrist  Pain, joint, forearm, left      Subjective Assessment - 08/01/15 0808    Subjective  The pain woke me up at 2 am and then again at 3:30 b/c it was so bad.    Pertinent History ORIF Lt distal radius fx 06/21/15   Patient Stated Goals decrease pain and get my hand working    Currently in Pain? Yes   Pain Score 5   up to 10/10 at night and with  exercises   Pain Location Wrist   Pain Orientation Left   Pain Descriptors / Indicators Shooting;Burning;Throbbing   Pain Type Acute pain   Pain Onset More than a month ago   Pain Frequency Intermittent   Aggravating Factors  worse at night   Pain Relieving Factors pt unsure                      OT Treatments/Exercises (OP) - 08/01/15 0001    ADLs   ADL Comments Discussed edema reduction strategies and provided handout. Also issued compression glove and instructed to wear at night underneath pre-fab splint   Exercises   Exercises Wrist;Elbow;Hand   Elbow Exercises   Forearm Supination PROM;AROM   Forearm Pronation PROM;AROM   Wrist Flexion AROM;PROM   Wrist Extension AROM;PROM   Hand Exercises   Opposition AROM   Other Hand Exercises Thumb A/ROM and P/ROM in flexion, radial, and palmer abduction   Modalities   Modalities Paraffin   LUE Paraffin   Number Minutes Paraffin 10 Minutes   LUE Paraffin Location Hand;Wrist   Comments at beginning of session to decrease pain and stiffness (fluidotherapy machine unavailable at this time)   Manual Therapy   Manual Therapy Joint mobilization  Manual therapy comments Light traction provided at wrist joints   Joint Mobilization light joint mobs at metacarpals                OT Education - 08/01/15 0833    Education provided Yes   Education Details P/ROM HEP for wrist/forearm, A/ROM and P/ROM for Lt thumb, edema reduction strategies (handout provided), compression glove   Person(s) Educated Patient   Methods Explanation;Demonstration;Handout   Comprehension Verbalized understanding;Returned demonstration          OT Short Term Goals - 08/01/15 0855    OT SHORT TERM GOAL #1   Title Pt will be independent with initial HEP   Time 4   Period Weeks   Status Achieved   OT SHORT TERM GOAL #2   Title Pt's 9-hole peg test will decrease by 20 seconds   Baseline 1 minute 24 seconds   Time 4   Period Weeks    Status On-going   OT SHORT TERM GOAL #3   Title Pt's left wrist extension AROM will improve to 40*   Baseline 15*   Time 4   Period Weeks   Status On-going   OT SHORT TERM GOAL #4   Title Pt's forearm supination AROM will improve to 80*   Baseline 70*   Time 4   Period Weeks   Status On-going   OT SHORT TERM GOAL #5   Title Pt will be educated on ways to increase UB/LB bathing and dressing tasks in order to increase independence with ADLs    Baseline min assist with UB/LB ADLs   Time 4   Period Weeks   Status On-going           OT Long Term Goals - 07/25/15 0905    OT LONG TERM GOAL #1   Title Pt will be independent with modified HEP   Time 8   Period Weeks   Status On-going   OT LONG TERM GOAL #2   Title Pt will be able to lift up to 40lb object using bilateral UEs in order to go back to work soon   Time 8   Period Weeks   Status On-going   OT LONG TERM GOAL #3   Title Patient will complete 9-hole peg test in less than 80 seconds   Baseline 1 minute 24 seconds   Time 8   Period Weeks   Status On-going   OT LONG TERM GOAL #4   Title Pt will be independent with UB/LB ADLs   Time 8   Period Weeks   Status On-going   OT LONG TERM GOAL #5   Title Pt's left wrist extension AROM will improve to 60*   Baseline 15*   Time 8   Period Weeks   Status On-going               Plan - 08/01/15 [id number]    Clinical Impression Statement Pt progressing with ROM, however still limited by pain and edema Lt hand/wrist. Pt will be 6 weeks post-op tomorrow.    Plan continue with fluido or paraffin, A/ROM and P/ROM (try forearm gym), also begin weighted stretches and issue putty HEP for grip and pinch strength   Consulted and Agree with Plan of Care Patient;Family member/caregiver        Problem List Patient Active Problem List   Diagnosis Date Noted  . Distal radius fracture, left 06/21/2015  . Hyperlipidemia 06/16/2014  . Essential hypertension 06/16/2014  .  Diabetes  mellitus type 2, insulin dependent ([place]) 03/24/2014    Carey Bullocks, OTR/L 08/01/2015, 8:58 AM  [facility] [address] [place], [place], [address] Phone: [phone]   Fax:  [phone]  Name: Brandy Burns MRN: [id number] Date of Birth: December 05, 1960

## 2015-08-01 NOTE — Patient Instructions (Signed)
PROM: Wrist Flexion / Extension   Grasp  hand and slowly bend wrist until stretch is felt. Relax. Then stretch as far as possible in opposite direction. Be sure to keep elbow bent.  Hold __10__ sec. each way Repeat _5___ times per set.    Do _4-6___ sessions per day.  Pronation (Passive)   Keep elbow bent at right angle and held firmly to side. Use other hand to turn forearm until palm faces downward. Hold _10___ seconds. Repeat __5__ times. Do _4-6___ sessions per day.  Supination (Passive)   Keep elbow bent at right angle and held firmly at side. Use other hand to turn forearm until palm faces upward. Hold __10__ seconds. Repeat __5__ times. Do _4-6___ sessions per day.  Copyright  VHI. All rights reserved.   Opposition (Active)   Touch tip of thumb to nail tip of each finger in turn, making an "O" shape. Repeat __10__ times. Do _4-6___ sessions per day.   MP Flexion (Active)   Bend thumb to touch base of little finger, keeping tip joint straight. Repeat __10-15__ times. Do _4-6___ sessions per day.         Composite Extension (Active)   Bring thumb up and out in hitchhiker position.  Repeat __10-15__ times. Do _4-6___ sessions per day.   Composite Flexion (Passive)   Use other hand to bend both joints of thumb at the same time. Hold _10___ seconds. Repeat __5__ times. Do _4-6___ sessions per day.   Composite Extension (Passive)   Using other hand, straighten thumb completely at both joints (AWAY from palm). Hold __10__ seconds. Repeat __5__ times. Do _4-6___ sessions per day.      Abduction (Passive Webspace Stretch)   Stretch thumb away from fingers (to FRONT of palm), using opposite hand. Hold _10___ seconds. Repeat __5__ times. Do __4-6__ sessions per day. Activity: Wrap thumb around coffee mug.*

## 2015-08-04 MED FILL — ZOLPIDEM TARTRATE 10 MG TAB: 10 | 30 days supply | Qty: 30 | Fill #0

## 2015-08-04 MED FILL — OXYCODONE/APAP 5/325MG: 5-325 | 10 days supply | Qty: 20 | Fill #0

## 2015-08-07 ENCOUNTER — Other Ambulatory Visit: Payer: Self-pay

## 2015-08-07 DIAGNOSIS — Z1231 Encounter for screening mammogram for malignant neoplasm of breast: Secondary | ICD-10-CM

## 2015-08-09 ENCOUNTER — Ambulatory Visit: Payer: PRIVATE HEALTH INSURANCE | Attending: Orthopedic Surgery | Admitting: *Deleted

## 2015-08-09 DIAGNOSIS — R278 Other lack of coordination: Secondary | ICD-10-CM

## 2015-08-09 DIAGNOSIS — R29898 Other symptoms and signs involving the musculoskeletal system: Secondary | ICD-10-CM | POA: Diagnosis present

## 2015-08-09 DIAGNOSIS — R279 Unspecified lack of coordination: Secondary | ICD-10-CM | POA: Insufficient documentation

## 2015-08-09 DIAGNOSIS — M25639 Stiffness of unspecified wrist, not elsewhere classified: Secondary | ICD-10-CM

## 2015-08-09 DIAGNOSIS — R531 Weakness: Secondary | ICD-10-CM

## 2015-08-09 DIAGNOSIS — M6281 Muscle weakness (generalized): Secondary | ICD-10-CM | POA: Insufficient documentation

## 2015-08-09 DIAGNOSIS — M25532 Pain in left wrist: Secondary | ICD-10-CM | POA: Diagnosis present

## 2015-08-09 NOTE — Therapy (Signed)
[facility] [address] [place], [place], [address] Phone: [phone]   Fax:  [phone]  Occupational Therapy Treatment  Patient Details  Name: Brandy Burns MRN: [id number] Date of Birth: [birth year] Referring Provider: Iran Planas  Encounter Date: 08/09/2015      OT End of Session - 08/09/15 0940    Visit Number 4   Number of Visits 8   Date for OT Re-Evaluation 09/12/15   Authorization Type UMR - autorized 8 visits    Authorization - Visit Number 4   Authorization - Number of Visits 8   OT Start Time 0850   OT Stop Time 0935   OT Time Calculation (min) 45 min   Activity Tolerance Patient tolerated treatment well   Behavior During Therapy [facility] for tasks assessed/performed      Past Medical History  Diagnosis Date  . GERD (gastroesophageal reflux disease)   . Hyperlipemia   . Essential hypertension 06/16/2014  . Asthma   . Diabetes mellitus     type 2  . Kidney stones   . Anemia     as a child    Past Surgical History  Procedure Laterality Date  . Tonsillectomy    . Tubal ligation    . Ectopic pregnancy surgery    . Colonoscopy  09/23/2011    Procedure: COLONOSCOPY;  Surgeon: Juanita Craver, MD;  Location: [facility];  Service: Endoscopy;  Laterality: N/A;  . Open reduction internal fixation (orif) distal radial fracture Left 06/21/2015    Procedure: OPEN REDUCTION INTERNAL FIXATION (ORIF) DISTAL RADIAL FRACTURE AND REPAIR AS NEEDED;  Surgeon: Iran Planas, MD;  Location: [facility];  Service: Orthopedics;  Laterality: Left;    There were no vitals filed for this visit.  Visit Diagnosis:  Decreased ROM of wrist  Pain, joint, forearm, left  Decreased grip strength of left hand  Decreased coordination  Decreased strength      Subjective Assessment - 08/09/15 0936    Subjective  I'm feeling good today, minimal pain   Pertinent History ORIF Lt distal radius fx 06/21/15   Patient Stated Goals  decrease pain and get my hand working    Currently in Pain? Yes   Pain Score 2    Pain Location Wrist   Pain Orientation Left   Pain Descriptors / Indicators Shooting;Burning;Throbbing   Pain Type Acute pain   Pain Onset More than a month ago   Pain Frequency Intermittent   Aggravating Factors  worse at night and some during exercise    Pain Relieving Factors pt unsure   Effect of Pain on Daily Activities sleep and ADLs          Paraffin to left hand and wrist for 10 minutes at beginning of session. Therapist then engaged patient in PROM stretching to left wrist. Therapist administered two HEP handouts for her revised HEP, see below under Education for more information regarding this. Encouraged patient to complete exercises two times per day (stretches, strengthening exercises, then stretching again). Administered red theraputty for exercises and patient went through routine. Pt able to complete digital compensation to all fingers without pain. Pt also states she is now able to fasten her bra and pants using left hand to assist.   At beginning of session, pt with 2/10 complaints of pain. During exercise, patient's pain increased to 5/10. After some passive stretching patient reported pain as "getting better".  OT Education - 08/09/15 [phone]    Education provided Yes   Education Details Active and Passive wrist flexion & extension stretches AND Theraputty wrist strengthening activities (wrist flexion, wrist extension, ulnar/radial deviation, supination/pronation).    Person(s) Educated Patient   Methods Explanation;Demonstration   Comprehension Verbalized understanding;Returned demonstration;Need further instruction          OT Short Term Goals - 08/09/15 0940    OT SHORT TERM GOAL #1   Title Pt will be independent with initial HEP   Time 4   Period Weeks   Status Achieved   OT SHORT TERM GOAL #2   Title Pt's 9-hole peg test will decrease by  20 seconds   Baseline 1 minute 24 seconds   Time 4   Period Weeks   Status Not Met   OT SHORT TERM GOAL #3   Title Pt's left wrist extension AROM will improve to 40*   Baseline 15*   Time 4   Period Weeks   Status Not Met   OT SHORT TERM GOAL #4   Title Pt's forearm supination AROM will improve to 80*   Baseline 70*   Time 4   Period Weeks   Status Not Met   OT SHORT TERM GOAL #5   Title Pt will be educated on ways to increase UB/LB bathing and dressing tasks in order to increase independence with ADLs    Baseline min assist with UB/LB ADLs   Time 4   Period Weeks   Status Partially Met           OT Long Term Goals - 08/09/15 0940    OT LONG TERM GOAL #1   Title Pt will be independent with modified HEP   Time 8   Period Weeks   Status On-going   OT LONG TERM GOAL #2   Title Pt will be able to lift up to 40lb object using bilateral UEs in order to go back to work soon   Time 8   Period Weeks   Status On-going   OT LONG TERM GOAL #3   Title Patient will complete 9-hole peg test in less than 80 seconds   Baseline 1 minute 24 seconds   Time 8   Period Weeks   Status On-going   OT LONG TERM GOAL #4   Title Pt will be independent with UB/LB ADLs   Time 8   Period Weeks   Status On-going   OT LONG TERM GOAL #5   Title Pt's left wrist extension AROM will improve to 60*   Baseline 15*   Time 8   Period Weeks   Status On-going               Plan - 08/09/15 0937    Clinical Impression Statement Pt continues to progress with ROM in left wrist/hand. Pt does continue to be limited by pain and edema. Pt has not met some ROM STG's, will continue to work towards meeting those as well as her LTG's. Pt reports she is able to fasten her bra now and is more independent with buttoning her pants; partially meeting last STG.    Pt will benefit from skilled therapeutic intervention in order to improve on the following deficits (Retired) Decreased coordination;Decreased  range of motion;Decreased skin integrity;Decreased scar mobility;Decreased strength;Increased edema;Impaired sensation;Impaired UE functional use;Improper body mechanics;Pain   Rehab Potential Good   Clinical Impairments Affecting Rehab Potential pain   OT Frequency 1x / week   OT  Duration 8 weeks   OT Treatment/Interventions Self-care/ADL training;Moist Heat;Traction;Ultrasound;Iontophoresis;Parrafin;Fluidtherapy;Contrast Bath;Therapeutic exercise;Manual Therapy;Scar mobilization;Passive range of motion;Splinting;Therapeutic exercises;Therapeutic activities;Patient/family education   Plan continue with fluido or paraffin, check on stretching and strengthening HEP, begin weighted stretches. Please also do 9-hole to see if she met STG   OT Home Exercise Plan A/ROM and P/ROM streches (handout given), theraputty strengthening exercises (handout given) - 08/09/15   Consulted and Agree with Plan of Care Patient        Problem List Patient Active Problem List   Diagnosis Date Noted  . Distal radius fracture, left 06/21/2015  . Hyperlipidemia 06/16/2014  . Essential hypertension 06/16/2014  . Diabetes mellitus type 2, insulin dependent ([place]) 03/24/2014    [name] , MS, OTR/L, CLT Pager: [phone]  08/09/2015, 9:54 AM  [facility] [address] [place], [place], [address] Phone: [phone]   Fax:  [phone]  Name: Brandy Burns MRN: [id number] Date of Birth: 10-03-1960

## 2015-08-16 ENCOUNTER — Ambulatory Visit: Payer: PRIVATE HEALTH INSURANCE | Attending: Orthopedic Surgery | Admitting: Occupational Therapy

## 2015-08-16 ENCOUNTER — Ambulatory Visit: Payer: Self-pay

## 2015-08-16 ENCOUNTER — Encounter: Payer: Self-pay | Admitting: Occupational Therapy

## 2015-08-16 DIAGNOSIS — M6281 Muscle weakness (generalized): Secondary | ICD-10-CM | POA: Insufficient documentation

## 2015-08-16 DIAGNOSIS — M25639 Stiffness of unspecified wrist, not elsewhere classified: Secondary | ICD-10-CM

## 2015-08-16 DIAGNOSIS — M25532 Pain in left wrist: Secondary | ICD-10-CM

## 2015-08-16 DIAGNOSIS — R531 Weakness: Secondary | ICD-10-CM

## 2015-08-16 DIAGNOSIS — R29898 Other symptoms and signs involving the musculoskeletal system: Secondary | ICD-10-CM

## 2015-08-16 NOTE — Therapy (Signed)
[facility] [address] [place], [place], [address] Phone: [phone]   Fax:  [phone]  Occupational Therapy Treatment  Patient Details  Name: Brandy Burns MRN: [id number] Date of Birth: 06/23/1960 Referring Provider: Iran Planas  Encounter Date: 08/16/2015      OT End of Session - 08/16/15 0842    Visit Number 5   Number of Visits 16   Date for OT Re-Evaluation 09/12/15   Authorization Type UMR - autorized 8 visits    Authorization - Visit Number 5   Authorization - Number of Visits 8   OT Start Time 0800   OT Stop Time 0848   OT Time Calculation (min) 48 min   Activity Tolerance Patient tolerated treatment well      Past Medical History  Diagnosis Date  . GERD (gastroesophageal reflux disease)   . Hyperlipemia   . Essential hypertension 06/16/2014  . Asthma   . Diabetes mellitus     type 2  . Kidney stones   . Anemia     as a child    Past Surgical History  Procedure Laterality Date  . Tonsillectomy    . Tubal ligation    . Ectopic pregnancy surgery    . Colonoscopy  09/23/2011    Procedure: COLONOSCOPY;  Surgeon: Juanita Craver, MD;  [facility]: [facility];  Service: Endoscopy;  Laterality: N/A;  . Open reduction internal fixation (orif) distal radial fracture Left 06/21/2015    Procedure: OPEN REDUCTION INTERNAL FIXATION (ORIF) DISTAL RADIAL FRACTURE AND REPAIR AS NEEDED;  Surgeon: Iran Planas, MD;  Location: [facility];  Service: Orthopedics;  Laterality: Left;    There were no vitals filed for this visit.  Visit Diagnosis:  Decreased ROM of wrist - Plan: Ot plan of care cert/re-cert  Pain, joint, forearm, left - Plan: Ot plan of care cert/re-cert  Decreased grip strength of left hand - Plan: Ot plan of care cert/re-cert  Decreased strength - Plan: Ot plan of care cert/re-cert      Subjective Assessment - 08/16/15 0813    Subjective  It's getting a little bit better   Pertinent History  ORIF Lt distal radius fx 06/21/15   Patient Stated Goals decrease pain and get my hand working    Currently in Pain? Yes   Pain Score 4    Pain Location Wrist   Pain Orientation Left   Pain Descriptors / Indicators Pressure   Pain Type Acute pain   Pain Onset More than a month ago   Pain Frequency Intermittent   Aggravating Factors  worse at night but better   Pain Relieving Factors elevation            [facility] OT Assessment - 08/16/15 0001    Coordination   Left 9 Hole Peg Test 28.75 sec.   AROM   Overall AROM Comments supination = 85*, pronation = 90*   Left Wrist Extension 32 Degrees   Left Wrist Flexion 40 Degrees   Hand Function   Left Hand Grip (lbs) 5 lbs                  OT Treatments/Exercises (OP) - 08/16/15 0001    ADLs   ADL Comments Re-assessed ROM at wrist/forearm and progress towards STG's. Pt made improvements in all ROM, coordination, and grip strength (although grip strength still remains very weak)    Wrist Exercises   Other wrist exercises weighted stretches with 3 lb. weight in wrist flexion  and wrist extension each x 3 reps holding 20 sec. each   Other wrist exercises Prayer stretch x 5 reps, holding 10 sec. for wrist extension   Hand Exercises   Other Hand Exercises Pt lost theraputty and re-issued yellow putty and reviewed putty HEP. Additional ex's issued including: tip pinch (thumb and index, thumb and long finger), and intrinsic + pull   LUE Paraffin   Number Minutes Paraffin 10 Minutes   LUE Paraffin Location Hand;Wrist   Comments at beginning of session to decrease pain and stiffness                  OT Short Term Goals - 08/16/15 5361    OT SHORT TERM GOAL #1   Title Pt will be independent with initial HEP   Time 4   Period Weeks   Status Achieved   OT SHORT TERM GOAL #2   Title Pt's 9-hole peg test will decrease by 20 seconds   Baseline 1 minute 24 seconds   Time 4   Period Weeks   Status Achieved  08/16/15: 28.75  sec   OT SHORT TERM GOAL #3   Title Pt's left wrist extension AROM will improve to 40*   Baseline 15*   Time 4   Period Weeks   Status Not Met  08/16/15: Lt wrist ext = 32 degrees   OT SHORT TERM GOAL #4   Title Pt's forearm supination AROM will improve to 80*   Baseline 70*   Time 4   Period Weeks   Status Achieved  08/16/15: 85 degrees   OT SHORT TERM GOAL #5   Title Pt will be educated on ways to increase UB/LB bathing and dressing tasks in order to increase independence with ADLs    Baseline min assist with UB/LB ADLs   Time 4   Period Weeks   Status Achieved           OT Long Term Goals - 08/09/15 0940    OT LONG TERM GOAL #1   Title Pt will be independent with modified HEP   Time 8   Period Weeks   Status On-going   OT LONG TERM GOAL #2   Title Pt will be able to lift up to 40lb object using bilateral UEs in order to go back to work soon   Time 8   Period Weeks   Status On-going   OT LONG TERM GOAL #3   Title Patient will complete 9-hole peg test in less than 80 seconds   Baseline 1 minute 24 seconds   Time 8   Period Weeks   Status On-going   OT LONG TERM GOAL #4   Title Pt will be independent with UB/LB ADLs   Time 8   Period Weeks   Status On-going   OT LONG TERM GOAL #5   Title Pt's left wrist extension AROM will improve to 60*   Baseline 15*   Time 8   Period Weeks   Status On-going               Plan - 08/16/15 0843    Clinical Impression Statement Pt continues to make progress in ROM, coordination and grip strength LUE. Pt remains limited in grip strength and wrist extension specifically. Pt met 4/5 STG's.    Pt will benefit from skilled therapeutic intervention in order to improve on the following deficits (Retired) Decreased coordination;Decreased range of motion;Impaired flexibility;Decreased endurance;Increased edema;Impaired sensation;Decreased activity tolerance;Decreased scar mobility;Impaired UE  functional use;Pain;Decreased  mobility;Decreased strength   OT Frequency --  2-3x/wk    OT Duration 6 weeks   OT Treatment/Interventions Self-care/ADL training;Therapeutic exercise;Patient/family education;Ultrasound;Manual Therapy;Splinting;Parrafin;DME and/or AE instruction;Cryotherapy;Compression bandaging;Therapeutic activities;Electrical Stimulation;Fluidtherapy;Scar mobilization;Moist Heat;Contrast Bath;Passive range of motion   Plan Increase frequency to 2-3x/week prn, continue paraffin or fluido, continue A/ROM, P/ROM, weighted stretches, strengthening, forearm gym   OT Home Exercise Plan A/ROM and P/ROM streches (handout given), theraputty strengthening exercises (handout given) - 08/09/15   Consulted and Agree with Plan of Care Patient        Problem List Patient Active Problem List   Diagnosis Date Noted  . Distal radius fracture, left 06/21/2015  . Hyperlipidemia 06/16/2014  . Essential hypertension 06/16/2014  . Diabetes mellitus type 2, insulin dependent ([place]) 03/24/2014    Carey Bullocks, OTR/L  08/16/2015, 8:53 AM  [facility] [address] [place], [place], [address] Phone: [phone]   Fax:  [phone]  Name: Brandy Burns MRN: [id number] Date of Birth: 07-21-1960

## 2015-08-17 MED FILL — LANTUS SOLOSTAR 100 UNITS/M: 100 | 46 days supply | Qty: 15 | Fill #1

## 2015-08-17 MED FILL — BD PEN NDL NANO 32GX5/32: 32G X 4 MM | 50 days supply | Qty: 100 | Fill #1

## 2015-08-18 ENCOUNTER — Ambulatory Visit: Payer: PRIVATE HEALTH INSURANCE | Attending: Physician Assistant | Admitting: Occupational Therapy

## 2015-08-18 DIAGNOSIS — R278 Other lack of coordination: Secondary | ICD-10-CM

## 2015-08-18 DIAGNOSIS — R29898 Other symptoms and signs involving the musculoskeletal system: Secondary | ICD-10-CM | POA: Diagnosis present

## 2015-08-18 DIAGNOSIS — M25532 Pain in left wrist: Secondary | ICD-10-CM | POA: Diagnosis present

## 2015-08-18 DIAGNOSIS — M6281 Muscle weakness (generalized): Secondary | ICD-10-CM | POA: Insufficient documentation

## 2015-08-18 DIAGNOSIS — R531 Weakness: Secondary | ICD-10-CM

## 2015-08-18 DIAGNOSIS — R279 Unspecified lack of coordination: Secondary | ICD-10-CM | POA: Diagnosis present

## 2015-08-18 DIAGNOSIS — M25639 Stiffness of unspecified wrist, not elsewhere classified: Secondary | ICD-10-CM

## 2015-08-18 NOTE — Therapy (Signed)
[facility] [address] [place], [place], [address] Phone: [phone]   Fax:  [phone]  Occupational Therapy Treatment  Patient Details  Name: Brandy Burns MRN: [id number] Date of Birth: 08-25-1960 Referring Provider: Iran Planas  Encounter Date: 08/18/2015      OT End of Session - 08/18/15 0825    Visit Number 6   Number of Visits 16   Date for OT Re-Evaluation 09/12/15   Authorization Type UMR - autorized 8 visits    Authorization - Visit Number 6   Authorization - Number of Visits 8   OT Start Time [phone]   OT Stop Time 0845   OT Time Calculation (min) 39 min   Activity Tolerance Patient tolerated treatment well      Past Medical History  Diagnosis Date  . GERD (gastroesophageal reflux disease)   . Hyperlipemia   . Essential hypertension 06/16/2014  . Asthma   . Diabetes mellitus     type 2  . Kidney stones   . Anemia     as a child    Past Surgical History  Procedure Laterality Date  . Tonsillectomy    . Tubal ligation    . Ectopic pregnancy surgery    . Colonoscopy  09/23/2011    Procedure: COLONOSCOPY;  Surgeon: Juanita Craver, MD;  Location: [facility];  Service: Endoscopy;  Laterality: N/A;  . Open reduction internal fixation (orif) distal radial fracture Left 06/21/2015    Procedure: OPEN REDUCTION INTERNAL FIXATION (ORIF) DISTAL RADIAL FRACTURE AND REPAIR AS NEEDED;  Surgeon: Iran Planas, MD;  Location: [facility];  Service: Orthopedics;  Laterality: Left;    There were no vitals filed for this visit.  Visit Diagnosis:  Decreased ROM of wrist  Pain, joint, forearm, left  Decreased grip strength of left hand  Decreased strength  Decreased coordination      Subjective Assessment - 08/18/15 0816    Pertinent History ORIF Lt distal radius fx 06/21/15   Patient Stated Goals decrease pain and get my hand working    Currently in Pain? Yes   Pain Score 4    Pain Location Wrist   Pain  Orientation Left   Pain Descriptors / Indicators Aching   Pain Type Acute pain   Pain Onset More than a month ago   Pain Frequency Intermittent   Aggravating Factors  worse at night   Pain Relieving Factors elevation   Multiple Pain Sites No              Wrist Exercises      Other wrist exercises  weighted stretches with 3 lb. weight in wrist flexion and wrist extension each x 5 reps holding 20 sec. each     Other wrist exercises  Prayer stretch x 5 reps, holding 10 sec. for wrist extension   A/ROM wrist flexion/ extension, then gentle P/ROM stretch in extension, Forearm gym x 6 reps    Hand Exercises     Other Hand Exercises  A/ROM/ PROM finger flexion with pt encouraged to perform greater MP flexionyellow putty  ex's issued including: tip pinch (thumb and index, thumb and long finger), and composite flexion    LUE Paraffin     Number Minutes Paraffin  10 Minutes     LUE Paraffin Location  Hand;Wrist     Comments  at beginning of session to decrease pain and stiffness       Ice pack to wrist and hand x 8 mins  at end of session for pain and edema, as pt reported soreness following last therapy appointment. No adverse reactions.                           OT Short Term Goals - 08/16/15 2440    OT SHORT TERM GOAL #1   Title Pt will be independent with initial HEP   Time 4   Period Weeks   Status Achieved   OT SHORT TERM GOAL #2   Title Pt's 9-hole peg test will decrease by 20 seconds   Baseline 1 minute 24 seconds   Time 4   Period Weeks   Status Achieved  08/16/15: 28.75 sec   OT SHORT TERM GOAL #3   Title Pt's left wrist extension AROM will improve to 40*   Baseline 15*   Time 4   Period Weeks   Status Not Met  08/16/15: Lt wrist ext = 32 degrees   OT SHORT TERM GOAL #4   Title Pt's forearm supination AROM will improve to 80*   Baseline 70*   Time 4   Period Weeks   Status Achieved  08/16/15: 85 degrees   OT SHORT TERM GOAL #5   Title Pt  will be educated on ways to increase UB/LB bathing and dressing tasks in order to increase independence with ADLs    Baseline min assist with UB/LB ADLs   Time 4   Period Weeks   Status Achieved           OT Long Term Goals - 08/09/15 0940    OT LONG TERM GOAL #1   Title Pt will be independent with modified HEP   Time 8   Period Weeks   Status On-going   OT LONG TERM GOAL #2   Title Pt will be able to lift up to 40lb object using bilateral UEs in order to go back to work soon   Time 8   Period Weeks   Status On-going   OT LONG TERM GOAL #3   Title Patient will complete 9-hole peg test in less than 80 seconds   Baseline 1 minute 24 seconds   Time 8   Period Weeks   Status On-going   OT LONG TERM GOAL #4   Title Pt will be independent with UB/LB ADLs   Time 8   Period Weeks   Status On-going   OT LONG TERM GOAL #5   Title Pt's left wrist extension AROM will improve to 60*   Baseline 15*   Time 8   Period Weeks   Status On-going               Plan - 08/18/15 0824    Clinical Impression Statement Pt is progressing towards goals with improving strength and ROM   Rehab Potential Good   OT Frequency --  2-3x   OT Duration 6 weeks   OT Treatment/Interventions Self-care/ADL training;Therapeutic exercise;Patient/family education;Ultrasound;Manual Therapy;Splinting;Parrafin;DME and/or AE instruction;Cryotherapy;Compression bandaging;Therapeutic activities;Electrical Stimulation;Fluidtherapy;Scar mobilization;Moist Heat;Contrast Bath;Passive range of motion   Plan modalities, A/ROM, P/ROM strengthening   Consulted and Agree with Plan of Care Patient        Problem List Patient Active Problem List   Diagnosis Date Noted  . Distal radius fracture, left 06/21/2015  . Hyperlipidemia 06/16/2014  . Essential hypertension 06/16/2014  . Diabetes mellitus type 2, insulin dependent ([place]) 03/24/2014    RINE,KATHRYN 08/18/2015, 8:28 AM  [facility] [address] [place], [place], [address] Phone: [phone]   Fax:  [phone]  Name: Brandy Burns MRN: [id number] Date of Birth: 1960-12-10

## 2015-08-23 ENCOUNTER — Encounter: Payer: Self-pay | Admitting: Occupational Therapy

## 2015-08-23 ENCOUNTER — Ambulatory Visit: Payer: PRIVATE HEALTH INSURANCE | Admitting: Occupational Therapy

## 2015-08-23 DIAGNOSIS — R29898 Other symptoms and signs involving the musculoskeletal system: Secondary | ICD-10-CM

## 2015-08-23 DIAGNOSIS — R531 Weakness: Secondary | ICD-10-CM

## 2015-08-23 DIAGNOSIS — M25639 Stiffness of unspecified wrist, not elsewhere classified: Secondary | ICD-10-CM

## 2015-08-23 DIAGNOSIS — M25532 Pain in left wrist: Secondary | ICD-10-CM

## 2015-08-23 NOTE — Therapy (Signed)
[facility] [address] [place], [place], [address] Phone: [phone]   Fax:  [phone]  Occupational Therapy Treatment  Patient Details  Name: Brandy Burns MRN: [id number] Date of Birth: 18-Nov-1960 Referring Provider: Iran Planas  Encounter Date: 08/23/2015      OT End of Session - 08/23/15 0844    Visit Number 7   Number of Visits 16   Date for OT Re-Evaluation 09/12/15   Authorization Type UMR - autorized 8 visits    Authorization - Visit Number 7   Authorization - Number of Visits 8   OT Start Time 0800   OT Stop Time [id number]   OT Time Calculation (min) 44 min   Activity Tolerance Patient tolerated treatment well      Past Medical History  Diagnosis Date  . GERD (gastroesophageal reflux disease)   . Hyperlipemia   . Essential hypertension 06/16/2014  . Asthma   . Diabetes mellitus     type 2  . Kidney stones   . Anemia     as a child    Past Surgical History  Procedure Laterality Date  . Tonsillectomy    . Tubal ligation    . Ectopic pregnancy surgery    . Colonoscopy  09/23/2011    Procedure: COLONOSCOPY;  Surgeon: Juanita Craver, MD;  Location: [facility];  Service: Endoscopy;  Laterality: N/A;  . Open reduction internal fixation (orif) distal radial fracture Left 06/21/2015    Procedure: OPEN REDUCTION INTERNAL FIXATION (ORIF) DISTAL RADIAL FRACTURE AND REPAIR AS NEEDED;  Surgeon: Iran Planas, MD;  Location: [facility];  Service: Orthopedics;  Laterality: Left;    There were no vitals filed for this visit.  Visit Diagnosis:  Decreased ROM of wrist  Pain, joint, forearm, left  Decreased grip strength of left hand  Decreased strength      Subjective Assessment - 08/23/15 0808    Subjective  It just feels like it's pulling   Pertinent History ORIF Lt distal radius fx 06/21/15   Patient Stated Goals decrease pain and get my hand working    Currently in Pain? Yes   Pain Score 4    Pain  Location Wrist   Pain Orientation Left   Pain Descriptors / Indicators Aching;Tightness   Pain Type Acute pain   Pain Onset More than a month ago   Pain Frequency Intermittent   Aggravating Factors  worse at night   Pain Relieving Factors elevation            [facility] OT Assessment - 08/23/15 0001    AROM   Left Wrist Extension 40 Degrees   Hand Function   Left Hand Grip (lbs) 10 lbs                  OT Treatments/Exercises (OP) - 08/23/15 0001    Wrist Exercises   Other wrist exercises A/ROM in wrist flex/ext x 15 reps each way, followed by P/ROM in wrist flex/ext x 5 reps each way holding 10 sec. Forearm gym x 4 reps for wrist and forearm combined motion   Other wrist exercises wrist flex/ext strengthening with 1 lb. weight x 10 reps each way. Followed by weighted stretches with 3 lb. weight x 5 reps, holding 10 sec. each way.    Hand Exercises   Other Hand Exercises Gripper set at 10 lbs resistance to pick up blocks Lt hand for sustained grip strength. Pt was able to do approx. 5 blocks at  15 lbs resistance but had to decrease back to 10 lbs.    LUE Paraffin   Number Minutes Paraffin 10 Minutes   LUE Paraffin Location Hand;Wrist   Comments at beginning of session to decrease pain and stiffness                  OT Short Term Goals - 08/23/15 0850    OT SHORT TERM GOAL #1   Title Pt will be independent with initial HEP   Time 4   Period Weeks   Status Achieved   OT SHORT TERM GOAL #2   Title Pt's 9-hole peg test will decrease by 20 seconds   Baseline 1 minute 24 seconds   Time 4   Period Weeks   Status Achieved  08/16/15: 28.75 sec   OT SHORT TERM GOAL #3   Title Pt's left wrist extension AROM will improve to 40*   Baseline 15*   Time 4   Period Weeks   Status Achieved  08/16/15: Lt wrist ext = 32 degrees, 08/23/15: 40 Degrees   OT SHORT TERM GOAL #4   Title Pt's forearm supination AROM will improve to 80*   Baseline 70*   Time 4   Period Weeks    Status Achieved  08/16/15: 85 degrees   OT SHORT TERM GOAL #5   Title Pt will be educated on ways to increase UB/LB bathing and dressing tasks in order to increase independence with ADLs    Baseline min assist with UB/LB ADLs   Time 4   Period Weeks   Status Achieved           OT Long Term Goals - 08/23/15 [id number]    OT LONG TERM GOAL #1   Title Pt will be independent with modified HEP   Time 8   Period Weeks   Status On-going   OT LONG TERM GOAL #2   Title Pt will be able to lift up to 40lb object using bilateral UEs in order to go back to work soon   Time 8   Period Weeks   Status On-going   OT LONG TERM GOAL #3   Title Patient will complete 9-hole peg test in less than 80 seconds   Baseline 1 minute 24 seconds   Time 8   Period Weeks   Status Achieved  08/16/15: 28.75 sec.    OT LONG TERM GOAL #4   Title Pt will be independent with UB/LB ADLs   Time 8   Period Weeks   Status On-going   OT LONG TERM GOAL #5   Title Pt's left wrist extension AROM will improve to 50*   Baseline 15*   Time 8   Period Weeks   Status Revised               Plan - 08/23/15 0845    Clinical Impression Statement Pt tolerating strengthening and weighted stretches with only slight increase in pain. Pt with improved wrist extension and grip strength today   Plan continue modalities, ROM, strengthening, digiflex   OT Home Exercise Plan A/ROM and P/ROM streches (handout given), theraputty strengthening exercises (handout given) - 08/09/15   Consulted and Agree with Plan of Care Patient        Problem List Patient Active Problem List   Diagnosis Date Noted  . Distal radius fracture, left 06/21/2015  . Hyperlipidemia 06/16/2014  . Essential hypertension 06/16/2014  . Diabetes mellitus type 2, insulin dependent ([name]) 03/24/2014  Carey Bullocks, OTR/L 08/23/2015, 8:51 AM  [facility] [address] [place], [place], [address] Phone: [phone]   Fax:  [phone]  Name: SEQOUIA Burns MRN: [id number] Date of Birth: 1960/07/05

## 2015-08-25 ENCOUNTER — Ambulatory Visit: Payer: PRIVATE HEALTH INSURANCE | Admitting: Occupational Therapy

## 2015-08-25 ENCOUNTER — Encounter: Payer: Self-pay | Admitting: Occupational Therapy

## 2015-08-25 DIAGNOSIS — R29898 Other symptoms and signs involving the musculoskeletal system: Secondary | ICD-10-CM

## 2015-08-25 DIAGNOSIS — M25639 Stiffness of unspecified wrist, not elsewhere classified: Secondary | ICD-10-CM

## 2015-08-25 DIAGNOSIS — M25532 Pain in left wrist: Secondary | ICD-10-CM

## 2015-08-25 NOTE — Therapy (Signed)
[facility] [address] [place], [place], [address] Phone: [phone]   Fax:  [phone]  Occupational Therapy Treatment  Patient Details  Name: Brandy Burns MRN: [id number] Date of Birth: 1960/10/15 Referring Provider: Iran Planas  Encounter Date: 08/25/2015      OT End of Session - 08/25/15 1049    Visit Number 8   Number of Visits 16   Date for OT Re-Evaluation 09/12/15   Authorization Type UMR - authorized 8 initial visits, Sydnee Levans (case manager) approved an additional 8 visits as of 08/23/15   Authorization - Visit Number 8   Authorization - Number of Visits 16   OT Start Time 0930   OT Stop Time [id number]   OT Time Calculation (min) 45 min   Activity Tolerance Patient tolerated treatment well      Past Medical History  Diagnosis Date  . GERD (gastroesophageal reflux disease)   . Hyperlipemia   . Essential hypertension 06/16/2014  . Asthma   . Diabetes mellitus     type 2  . Kidney stones   . Anemia     as a child    Past Surgical History  Procedure Laterality Date  . Tonsillectomy    . Tubal ligation    . Ectopic pregnancy surgery    . Colonoscopy  09/23/2011    Procedure: COLONOSCOPY;  Surgeon: Juanita Craver, MD;  Location: [facility];  Service: Endoscopy;  Laterality: N/A;  . Open reduction internal fixation (orif) distal radial fracture Left 06/21/2015    Procedure: OPEN REDUCTION INTERNAL FIXATION (ORIF) DISTAL RADIAL FRACTURE AND REPAIR AS NEEDED;  Surgeon: Iran Planas, MD;  Location: [facility];  Service: Orthopedics;  Laterality: Left;    There were no vitals filed for this visit.  Visit Diagnosis:  Decreased ROM of wrist  Pain, joint, forearm, left  Decreased grip strength of left hand      Subjective Assessment - 08/25/15 0936    Subjective  I'm able to make a better fist today   Pertinent History ORIF Lt distal radius fx 06/21/15   Patient Stated Goals decrease pain and get my  hand working    Currently in Pain? Yes   Pain Score 3    Pain Location Wrist   Pain Orientation Left   Pain Descriptors / Indicators Aching;Tightness   Pain Type Acute pain   Pain Onset More than a month ago   Pain Frequency Intermittent   Aggravating Factors  worse at night and early morning   Pain Relieving Factors elevation                      OT Treatments/Exercises (OP) - 08/25/15 0001    Elbow Exercises   Other elbow exercises weighted stretch in supination/pronation with hammer x 5 reps each way holding 10 sec.    Wrist Exercises   Other wrist exercises Pt shown alternative P/ROM for wrist extension including wt.bearing on table, and sliding hand down wall in addition to prayer stretch. Pt instructed to do which one she feels the most stretch with, or can alternate between all 3 but not to do all 3 during the same session.    Hand Exercises   Other Hand Exercises Digiflex (red resistance) for mass composite flexion and tip pinch x 10 reps each.    LUE Paraffin   Number Minutes Paraffin 10 Minutes   LUE Paraffin Location Hand;Wrist   Comments at beginning of session to decrease  pain   Manual Therapy   Manual Therapy Passive ROM   Joint Mobilization light joint mobs at metacarpals followed by slight traction at wrist joint   Passive ROM progressed to therapist passively stretching pt's wrist in flex/ext while providing light traction. Then passive finger flexion compositely and isolated IP flexion.                   OT Short Term Goals - 08/23/15 0850    OT SHORT TERM GOAL #1   Title Pt will be independent with initial HEP   Time 4   Period Weeks   Status Achieved   OT SHORT TERM GOAL #2   Title Pt's 9-hole peg test will decrease by 20 seconds   Baseline 1 minute 24 seconds   Time 4   Period Weeks   Status Achieved  08/16/15: 28.75 sec   OT SHORT TERM GOAL #3   Title Pt's left wrist extension AROM will improve to 40*   Baseline 15*   Time 4    Period Weeks   Status Achieved  08/16/15: Lt wrist ext = 32 degrees, 08/23/15: 40 Degrees   OT SHORT TERM GOAL #4   Title Pt's forearm supination AROM will improve to 80*   Baseline 70*   Time 4   Period Weeks   Status Achieved  08/16/15: 85 degrees   OT SHORT TERM GOAL #5   Title Pt will be educated on ways to increase UB/LB bathing and dressing tasks in order to increase independence with ADLs    Baseline min assist with UB/LB ADLs   Time 4   Period Weeks   Status Achieved           OT Long Term Goals - 08/23/15 [id number]    OT LONG TERM GOAL #1   Title Pt will be independent with modified HEP   Time 8   Period Weeks   Status On-going   OT LONG TERM GOAL #2   Title Pt will be able to lift up to 40lb object using bilateral UEs in order to go back to work soon   Time 8   Period Weeks   Status On-going   OT LONG TERM GOAL #3   Title Patient will complete 9-hole peg test in less than 80 seconds   Baseline 1 minute 24 seconds   Time 8   Period Weeks   Status Achieved  08/16/15: 28.75 sec.    OT LONG TERM GOAL #4   Title Pt will be independent with UB/LB ADLs   Time 8   Period Weeks   Status On-going   OT LONG TERM GOAL #5   Title Pt's left wrist extension AROM will improve to 50*   Baseline 15*   Time 8   Period Weeks   Status Revised               Plan - 08/25/15 1051    Clinical Impression Statement Pt reports increased use of LT hand/wrist for functional daily tasks and reports increased ability to make full fist today   Plan Pt approved 8 additional visits via case manager. Continue with modalities, ROM, strengthening   OT Home Exercise Plan A/ROM and P/ROM streches (handout given), theraputty strengthening exercises (handout given) - 08/09/15   Consulted and Agree with Plan of Care Patient        Problem List Patient Active Problem List   Diagnosis Date Noted  . Distal radius fracture, left 06/21/2015  .  Hyperlipidemia 06/16/2014  . Essential  hypertension 06/16/2014  . Diabetes mellitus type 2, insulin dependent ([place]) 03/24/2014    Carey Bullocks, OTR/L 08/25/2015, 10:54 AM  [facility] [address] [place], [place], [address] Phone: [phone]   Fax:  [phone]  Name: Brandy Burns MRN: [id number] Date of Birth: 1961-03-19

## 2015-08-28 ENCOUNTER — Encounter: Payer: Self-pay | Admitting: Occupational Therapy

## 2015-08-28 ENCOUNTER — Ambulatory Visit: Payer: PRIVATE HEALTH INSURANCE | Admitting: Occupational Therapy

## 2015-08-28 DIAGNOSIS — R29898 Other symptoms and signs involving the musculoskeletal system: Secondary | ICD-10-CM | POA: Diagnosis not present

## 2015-08-28 DIAGNOSIS — M25532 Pain in left wrist: Secondary | ICD-10-CM

## 2015-08-28 DIAGNOSIS — M25639 Stiffness of unspecified wrist, not elsewhere classified: Secondary | ICD-10-CM

## 2015-08-28 NOTE — Therapy (Signed)
[facility] [address] [place], [place], [address] Phone: [phone]   Fax:  [phone]  Occupational Therapy Treatment  Patient Details  Name: Brandy Burns MRN: [id number] Date of Birth: August 24, 1960 Referring Provider: Iran Planas  Encounter Date: 08/28/2015      OT End of Session - 08/28/15 1012    Visit Number 9   Number of Visits 16   Date for OT Re-Evaluation 09/12/15   Authorization Type UMR - authorized 8 initial visits, Sydnee Levans (case manager) approved an additional 8 visits as of 08/23/15   Authorization - Visit Number 9   Authorization - Number of Visits 16   OT Start Time 0930   OT Stop Time 1015   OT Time Calculation (min) 45 min   Activity Tolerance Patient tolerated treatment well      Past Medical History  Diagnosis Date  . GERD (gastroesophageal reflux disease)   . Hyperlipemia   . Essential hypertension 06/16/2014  . Asthma   . Diabetes mellitus     type 2  . Kidney stones   . Anemia     as a child    Past Surgical History  Procedure Laterality Date  . Tonsillectomy    . Tubal ligation    . Ectopic pregnancy surgery    . Colonoscopy  09/23/2011    Procedure: COLONOSCOPY;  Surgeon: Juanita Craver, MD;  [facility];  Service: Endoscopy;  Laterality: N/A;  . Open reduction internal fixation (orif) distal radial fracture Left 06/21/2015    Procedure: OPEN REDUCTION INTERNAL FIXATION (ORIF) DISTAL RADIAL FRACTURE AND REPAIR AS NEEDED;  Surgeon: Iran Planas, MD;  Location: [facility];  Service: Orthopedics;  Laterality: Left;    There were no vitals filed for this visit.  Visit Diagnosis:  Decreased ROM of wrist  Pain, joint, forearm, left  Decreased grip strength of left hand      Subjective Assessment - 08/28/15 0940    Subjective  I cut collards this weekend and carried the laundry basket with both hands   Pertinent History ORIF Lt distal radius fx 06/21/15   Patient  Stated Goals decrease pain and get my hand working    Currently in Pain? Yes   Pain Score 3    Pain Location Wrist   Pain Orientation Left   Pain Descriptors / Indicators Aching;Tightness   Pain Type Acute pain   Pain Onset More than a month ago   Pain Frequency Intermittent   Aggravating Factors  worse at night and early morning   Pain Relieving Factors elevation                      OT Treatments/Exercises (OP) - 08/28/15 0001    Wrist Exercises   Other wrist exercises forearm gym for combined wrist and forearm movement x 4 reps followed by hammer stretch for pronation and supination x 5 reps each way holding 10 sec.    Other wrist exercises wrist flex/ext strengthening with 2 lb. weight x 15 reps each way. Followed by weighted stretches with 4 lb. weight x 5 reps, holding 10 sec. each way.    Hand Exercises   Other Hand Exercises Gripper set at 20 lbs resistance to pick up blocks for sustained grip strength Lt hand. Pt with min drops (approx. 5) and mod difficulty.    LUE Paraffin   Number Minutes Paraffin 10 Minutes   LUE Paraffin Location Hand;Wrist   Comments at beginning  of session to decrease pain and stiffness   Manual Therapy   Passive ROM Passively stretching pt's wrist in flex/ext while providing light traction. Then passive finger flexion compositely and isolated IP flexion. Followed by self ROM stretches in passive wrist flexion and wrist extension (prayer stretch)                  OT Short Term Goals - 08/23/15 0850    OT SHORT TERM GOAL #1   Title Pt will be independent with initial HEP   Time 4   Period Weeks   Status Achieved   OT SHORT TERM GOAL #2   Title Pt's 9-hole peg test will decrease by 20 seconds   Baseline 1 minute 24 seconds   Time 4   Period Weeks   Status Achieved  08/16/15: 28.75 sec   OT SHORT TERM GOAL #3   Title Pt's left wrist extension AROM will improve to 40*   Baseline 15*   Time 4   Period Weeks   Status  Achieved  08/16/15: Lt wrist ext = 32 degrees, 08/23/15: 40 Degrees   OT SHORT TERM GOAL #4   Title Pt's forearm supination AROM will improve to 80*   Baseline 70*   Time 4   Period Weeks   Status Achieved  08/16/15: 85 degrees   OT SHORT TERM GOAL #5   Title Pt will be educated on ways to increase UB/LB bathing and dressing tasks in order to increase independence with ADLs    Baseline min assist with UB/LB ADLs   Time 4   Period Weeks   Status Achieved           OT Long Term Goals - 08/23/15 [id number]    OT LONG TERM GOAL #1   Title Pt will be independent with modified HEP   Time 8   Period Weeks   Status On-going   OT LONG TERM GOAL #2   Title Pt will be able to lift up to 40lb object using bilateral UEs in order to go back to work soon   Time 8   Period Weeks   Status On-going   OT LONG TERM GOAL #3   Title Patient will complete 9-hole peg test in less than 80 seconds   Baseline 1 minute 24 seconds   Time 8   Period Weeks   Status Achieved  08/16/15: 28.75 sec.    OT LONG TERM GOAL #4   Title Pt will be independent with UB/LB ADLs   Time 8   Period Weeks   Status On-going   OT LONG TERM GOAL #5   Title Pt's left wrist extension AROM will improve to 50*   Baseline 15*   Time 8   Period Weeks   Status Revised               Plan - 08/28/15 1013    Clinical Impression Statement Pt continues to improve in re: to using LUE in more functional tasks. Pt also with increased sustained grip strength and tolerating increased wrist strengthening exercises   Plan continue with modalities, ROM, strengthening, assess grip strength   OT Home Exercise Plan A/ROM and P/ROM streches (handout given), theraputty strengthening exercises (handout given) - 08/09/15   Consulted and Agree with Plan of Care Patient        Problem List Patient Active Problem List   Diagnosis Date Noted  . Distal radius fracture, left 06/21/2015  . Hyperlipidemia 06/16/2014  .  Essential hypertension  06/16/2014  . Diabetes mellitus type 2, insulin dependent ([place]) 03/24/2014    Carey Bullocks, OTR/L 08/28/2015, 10:15 AM  [facility] [address] [place], [place], [address] Phone: [phone]   Fax:  [phone]  Name: Brandy Burns MRN: [id number] Date of Birth: [birth year]

## 2015-08-30 ENCOUNTER — Encounter: Payer: Self-pay | Admitting: Occupational Therapy

## 2015-08-30 ENCOUNTER — Ambulatory Visit: Payer: PRIVATE HEALTH INSURANCE | Attending: Orthopedic Surgery | Admitting: Occupational Therapy

## 2015-08-30 DIAGNOSIS — M6281 Muscle weakness (generalized): Secondary | ICD-10-CM | POA: Diagnosis present

## 2015-08-30 DIAGNOSIS — M25532 Pain in left wrist: Secondary | ICD-10-CM | POA: Insufficient documentation

## 2015-08-30 DIAGNOSIS — M25639 Stiffness of unspecified wrist, not elsewhere classified: Secondary | ICD-10-CM

## 2015-08-30 DIAGNOSIS — R29898 Other symptoms and signs involving the musculoskeletal system: Secondary | ICD-10-CM | POA: Diagnosis present

## 2015-08-30 NOTE — Therapy (Signed)
Brandy Burns [address] [place], [place], [address] Phone: [phone]   Fax:  [phone]  Occupational Therapy Treatment  Patient Details  Name: Brandy Burns MRN: [id number] Date of Birth: 05-31-1961 Referring Provider: Iran Planas  Encounter Date: 08/30/2015      OT End of Session - 08/30/15 0835    Visit Number 10   Number of Visits 16   Date for OT Re-Evaluation 09/12/15   Authorization Type UMR - authorized 8 initial visits, Sydnee Levans (case manager) approved an additional 8 visits as of 08/23/15   Authorization - Visit Number 10   Authorization - Number of Visits 16   OT Start Time [id number]   OT Stop Time 0841   OT Time Calculation (min) 43 min   Activity Tolerance Patient tolerated treatment well      Past Medical History  Diagnosis Date  . GERD (gastroesophageal reflux disease)   . Hyperlipemia   . Essential hypertension 06/16/2014  . Asthma   . Diabetes mellitus     type 2  . Kidney stones   . Anemia     as a child    Past Surgical History  Procedure Laterality Date  . Tonsillectomy    . Tubal ligation    . Ectopic pregnancy surgery    . Colonoscopy  09/23/2011    Procedure: COLONOSCOPY;  Surgeon: Juanita Craver, MD;  Location: [facility];  Service: Endoscopy;  Laterality: N/A;  . Open reduction internal fixation (orif) distal radial fracture Left 06/21/2015    Procedure: OPEN REDUCTION INTERNAL FIXATION (ORIF) DISTAL RADIAL FRACTURE AND REPAIR AS NEEDED;  Surgeon: Iran Planas, MD;  Location: [facility];  Service: Orthopedics;  Laterality: Left;    There were no vitals filed for this visit.  Visit Diagnosis:  Decreased ROM of wrist  Pain, joint, forearm, left  Decreased grip strength of left hand      Subjective Assessment - 08/30/15 0803    Pertinent History ORIF Lt distal radius fx 06/21/15   Patient Stated Goals decrease pain and get my hand working    Currently in Pain? Yes   Pain  Score 4    Pain Location Wrist   Pain Orientation Left   Pain Descriptors / Indicators Aching;Tightness   Pain Type Acute pain   Pain Onset More than a month ago   Pain Frequency Intermittent   Aggravating Factors  worse at night and early morning   Pain Relieving Factors elevation            [facility] OT Assessment - 08/30/15 0001    Hand Function   Left Hand Grip (lbs) 10 lbs                  OT Treatments/Exercises (OP) - 08/30/15 0001    Wrist Exercises   Other wrist exercises forearm gym for combined wrist and forearm movement x 4 reps followed by hammer stretch for pronation and supination x 5 reps each way holding 10 sec.    Other wrist exercises wrist flex/ext strengthening with 2 lb. weight x 15 reps each way. Followed by weighted stretches with 4 lb. weight x 5 reps, holding 10 sec. each way.    Hand Exercises   Other Hand Exercises Gripper set at 20 lbs resistance to pick up blocks for sustained grip strength Lt hand. Pt with mod drops and mod difficulty. Pt has more fatigue with activity today than last session   LUE Paraffin  Number Minutes Paraffin 10 Minutes   LUE Paraffin Location Hand;Wrist   Comments at beginning of session to decrease pain/stiffness   Manual Therapy   Passive ROM Passively stretching pt's wrist in flex/ext while providing light traction. Then passive finger flexion compositely and isolated IP flexion. Followed by self ROM stretches in passive wrist flexion and wrist extension (prayer stretch and table stretch)                  OT Short Term Goals - 08/23/15 0850    OT SHORT TERM GOAL #1   Title Pt will be independent with initial HEP   Time 4   Period Weeks   Status Achieved   OT SHORT TERM GOAL #2   Title Pt's 9-hole peg test will decrease by 20 seconds   Baseline 1 minute 24 seconds   Time 4   Period Weeks   Status Achieved  08/16/15: 28.75 sec   OT SHORT TERM GOAL #3   Title Pt's left wrist extension AROM will  improve to 40*   Baseline 15*   Time 4   Period Weeks   Status Achieved  08/16/15: Lt wrist ext = 32 degrees, 08/23/15: 40 Degrees   OT SHORT TERM GOAL #4   Title Pt's forearm supination AROM will improve to 80*   Baseline 70*   Time 4   Period Weeks   Status Achieved  08/16/15: 85 degrees   OT SHORT TERM GOAL #5   Title Pt will be educated on ways to increase UB/LB bathing and dressing tasks in order to increase independence with ADLs    Baseline min assist with UB/LB ADLs   Time 4   Period Weeks   Status Achieved           OT Long Term Goals - 08/23/15 [id number]    OT LONG TERM GOAL #1   Title Pt will be independent with modified HEP   Time 8   Period Weeks   Status On-going   OT LONG TERM GOAL #2   Title Pt will be able to lift up to 40lb object using bilateral UEs in order to go back to work soon   Time 8   Period Weeks   Status On-going   OT LONG TERM GOAL #3   Title Patient will complete 9-hole peg test in less than 80 seconds   Baseline 1 minute 24 seconds   Time 8   Period Weeks   Status Achieved  08/16/15: 28.75 sec.    OT LONG TERM GOAL #4   Title Pt will be independent with UB/LB ADLs   Time 8   Period Weeks   Status On-going   OT LONG TERM GOAL #5   Title Pt's left wrist extension AROM will improve to 50*   Baseline 15*   Time 8   Period Weeks   Status Revised               Plan - 08/30/15 [id number]    Clinical Impression Statement Pt with increased drops and fatigue today with gripper activity. Pt reports increased stiffness and soreness this am.    Plan continue modalities, ROM, strengthening, wrist winder   OT Home Exercise Plan A/ROM and P/ROM streches (handout given), theraputty strengthening exercises (handout given) - 08/09/15   Consulted and Agree with Plan of Care Patient        Problem List Patient Active Problem List   Diagnosis Date Noted  . Distal radius fracture,  left 06/21/2015  . Hyperlipidemia 06/16/2014  . Essential  hypertension 06/16/2014  . Diabetes mellitus type 2, insulin dependent ([place]) 03/24/2014    Carey Bullocks, OTR/L 08/30/2015, 8:38 AM  [facility] [address] [place], [place], [address] Phone: [phone]   Fax:  [phone]  Name: Brandy Burns MRN: [id number] Date of Birth: [birth year]

## 2015-09-01 ENCOUNTER — Ambulatory Visit
Admission: RE | Admit: 2015-09-01 | Discharge: 2015-09-01 | Disposition: A | Discharge status: Home / Self Care | Payer: 59 | Source: Ambulatory Visit

## 2015-09-01 DIAGNOSIS — Z1231 Encounter for screening mammogram for malignant neoplasm of breast: Secondary | ICD-10-CM

## 2015-09-04 ENCOUNTER — Encounter: Payer: Self-pay | Admitting: Occupational Therapy

## 2015-09-04 ENCOUNTER — Ambulatory Visit: Payer: PRIVATE HEALTH INSURANCE | Admitting: Occupational Therapy

## 2015-09-04 DIAGNOSIS — R29898 Other symptoms and signs involving the musculoskeletal system: Secondary | ICD-10-CM

## 2015-09-04 DIAGNOSIS — M25639 Stiffness of unspecified wrist, not elsewhere classified: Secondary | ICD-10-CM

## 2015-09-04 NOTE — Therapy (Signed)
[facility] [address] [place], [place], [address] Phone: [phone]   Fax:  [phone]  Occupational Therapy Treatment  Patient Details  Name: Brandy Burns MRN: [id number] Date of Birth: 1960-07-15 Referring Provider: Iran Planas  Encounter Date: 09/04/2015      OT End of Session - 09/04/15 1017    OT Start Time 0932   OT Stop Time 1016   OT Time Calculation (min) 44 min   Activity Tolerance Patient tolerated treatment well   Behavior During Therapy [facility] for tasks assessed/performed      Past Medical History  Diagnosis Date  . GERD (gastroesophageal reflux disease)   . Hyperlipemia   . Essential hypertension 06/16/2014  . Asthma   . Diabetes mellitus     type 2  . Kidney stones   . Anemia     as a child    Past Surgical History  Procedure Laterality Date  . Tonsillectomy    . Tubal ligation    . Ectopic pregnancy surgery    . Colonoscopy  09/23/2011    Procedure: COLONOSCOPY;  Surgeon: Juanita Craver, MD;  Location: [facility];  Service: Endoscopy;  Laterality: N/A;  . Open reduction internal fixation (orif) distal radial fracture Left 06/21/2015    Procedure: OPEN REDUCTION INTERNAL FIXATION (ORIF) DISTAL RADIAL FRACTURE AND REPAIR AS NEEDED;  Surgeon: Iran Planas, MD;  Location: [facility];  Service: Orthopedics;  Laterality: Left;    There were no vitals filed for this visit.  Visit Diagnosis:  Decreased ROM of wrist  Decreased grip strength of left hand      Subjective Assessment - 09/04/15 0939    Subjective  I started knitting this weekend, I was able to knot a scarf.   Pertinent History ORIF Lt distal radius fx 06/21/15   Patient Stated Goals decrease pain and get my hand working    Currently in Pain? Yes   Pain Score 3    Pain Location Wrist   Pain Orientation Left   Pain Descriptors / Indicators Aching;Tightness   Pain Type Acute pain   Pain Onset More than a month ago   Pain  Frequency Intermittent   Aggravating Factors  aches at night, stiff in the morning   Pain Relieving Factors pain medicine before bed   Effect of Pain on Daily Activities sleep and ADL's   Multiple Pain Sites No                      OT Treatments/Exercises (OP) - 09/04/15 0001    ADLs   ADL Comments Able to use left hand to wash dishes, folding clothes, sweeping floor, holding dustpan, etc.  Has been able to start knitting using two hands   Hand Exercises   Other Hand Exercises Gripper set at 20 lbs resistance.  Improved rate, with infrequent drops.  3 lb resistance with dumbbell to wrist flex / ext x 10 reps.     Other Hand Exercises Wrist winder through three full rotations with 1 lb, then 2 lb resistance.  Upgraded theraputty to medium soft red.  Reviewed all HEP with putty.     LUE Paraffin   Number Minutes Paraffin 10 Minutes   LUE Paraffin Location Hand;Wrist   Comments at beginning of session to reduce stiffness and pain   Manual Therapy   Manual Therapy Passive ROM   Manual therapy comments Light traction at left wrist to increase motion   Joint Mobilization Light  joint mobilization at letacarpals.                  OT Education - 09/04/15 1008    Education provided Yes   Education Details Upgraded theraputty to  medium soft.  Discussed methods to ice wrist at home   Person(s) Educated Patient   Methods Explanation;Demonstration   Comprehension Verbalized understanding;Returned demonstration          OT Short Term Goals - 08/23/15 0850    OT SHORT TERM GOAL #1   Title Pt will be independent with initial HEP   Time 4   Period Weeks   Status Achieved   OT SHORT TERM GOAL #2   Title Pt's 9-hole peg test will decrease by 20 seconds   Baseline 1 minute 24 seconds   Time 4   Period Weeks   Status Achieved  08/16/15: 28.75 sec   OT SHORT TERM GOAL #3   Title Pt's left wrist extension AROM will improve to 40*   Baseline 15*   Time 4   Period  Weeks   Status Achieved  08/16/15: Lt wrist ext = 32 degrees, 08/23/15: 40 Degrees   OT SHORT TERM GOAL #4   Title Pt's forearm supination AROM will improve to 80*   Baseline 70*   Time 4   Period Weeks   Status Achieved  08/16/15: 85 degrees   OT SHORT TERM GOAL #5   Title Pt will be educated on ways to increase UB/LB bathing and dressing tasks in order to increase independence with ADLs    Baseline min assist with UB/LB ADLs   Time 4   Period Weeks   Status Achieved           OT Long Term Goals - 08/23/15 [id number]    OT LONG TERM GOAL #1   Title Pt will be independent with modified HEP   Time 8   Period Weeks   Status On-going   OT LONG TERM GOAL #2   Title Pt will be able to lift up to 40lb object using bilateral UEs in order to go back to work soon   Time 8   Period Weeks   Status On-going   OT LONG TERM GOAL #3   Title Patient will complete 9-hole peg test in less than 80 seconds   Baseline 1 minute 24 seconds   Time 8   Period Weeks   Status Achieved  08/16/15: 28.75 sec.    OT LONG TERM GOAL #4   Title Pt will be independent with UB/LB ADLs   Time 8   Period Weeks   Status On-going   OT LONG TERM GOAL #5   Title Pt's left wrist extension AROM will improve to 50*   Baseline 15*   Time 8   Period Weeks   Status Revised               Plan - 09/04/15 1013    Clinical Impression Statement Patient making improvement with strength and functional use of left UE.     Pt will benefit from skilled therapeutic intervention in order to improve on the following deficits (Retired) Decreased coordination;Decreased range of motion;Impaired flexibility;Decreased endurance;Increased edema;Impaired sensation;Decreased activity tolerance;Decreased scar mobility;Impaired UE functional use;Pain;Decreased mobility;Decreased strength   Rehab Potential Good   Clinical Impairments Affecting Rehab Potential pain   OT Duration 6 weeks   OT Treatment/Interventions Self-care/ADL  training;Therapeutic exercise;Patient/family education;Ultrasound;Manual Therapy;Splinting;Parrafin;DME and/or AE instruction;Cryotherapy;Compression bandaging;Therapeutic activities;Electrical Stimulation;Fluidtherapy;Scar mobilization;Moist Heat;Contrast Bath;Passive range  of motion   Plan Continue modalities, ROM, strengthening left wrist and forearm / digits   OT Home Exercise Plan A/ROM and P/ROM streches (handout given), theraputty strengthening exercises (handout given) - 08/09/15   Consulted and Agree with Plan of Care Patient        Problem List Patient Active Problem List   Diagnosis Date Noted  . Distal radius fracture, left 06/21/2015  . Hyperlipidemia 06/16/2014  . Essential hypertension 06/16/2014  . Diabetes mellitus type 2, insulin dependent ([place]) 03/24/2014    Mariah Milling, OTR/L 09/04/2015, 11:29 AM  [facility] [address] [place], [place], [address] Phone: [phone]   Fax:  [phone]  Name: Brandy Burns MRN: [id number] Date of Birth: 08-28-1960

## 2015-09-06 ENCOUNTER — Ambulatory Visit: Payer: PRIVATE HEALTH INSURANCE | Admitting: Occupational Therapy

## 2015-09-06 DIAGNOSIS — M25532 Pain in left wrist: Secondary | ICD-10-CM

## 2015-09-06 DIAGNOSIS — R278 Other lack of coordination: Secondary | ICD-10-CM

## 2015-09-06 DIAGNOSIS — M25639 Stiffness of unspecified wrist, not elsewhere classified: Secondary | ICD-10-CM

## 2015-09-06 DIAGNOSIS — R279 Unspecified lack of coordination: Secondary | ICD-10-CM

## 2015-09-06 DIAGNOSIS — R29898 Other symptoms and signs involving the musculoskeletal system: Secondary | ICD-10-CM | POA: Diagnosis not present

## 2015-09-06 DIAGNOSIS — R531 Weakness: Secondary | ICD-10-CM

## 2015-09-06 NOTE — Therapy (Signed)
[facility] [address] [place], [place], [address] Phone: [phone]   Fax:  [phone]  Occupational Therapy Treatment  Patient Details  Name: Brandy Burns MRN: [id number] Date of Birth: [birth year] Referring Provider: Iran Planas  Encounter Date: 09/06/2015      OT End of Session - 09/06/15 0817    Visit Number 12   Number of Visits 16   Date for OT Re-Evaluation 09/12/15   Authorization Type UMR - authorized 8 initial visits, Sydnee Levans (case manager) approved an additional 8 visits as of 08/23/15   Authorization - Visit Number 12   Authorization - Number of Visits 16   OT Start Time 0808   OT Stop Time 0845   OT Time Calculation (min) 37 min   Activity Tolerance Patient tolerated treatment well   Behavior During Therapy [facility] for tasks assessed/performed      Past Medical History  Diagnosis Date  . GERD (gastroesophageal reflux disease)   . Hyperlipemia   . Essential hypertension 06/16/2014  . Asthma   . Diabetes mellitus     type 2  . Kidney stones   . Anemia     as a child    Past Surgical History  Procedure Laterality Date  . Tonsillectomy    . Tubal ligation    . Ectopic pregnancy surgery    . Colonoscopy  09/23/2011    Procedure: COLONOSCOPY;  Surgeon: Juanita Craver, MD;  Location: [facility];  Service: Endoscopy;  Laterality: N/A;  . Open reduction internal fixation (orif) distal radial fracture Left 06/21/2015    Procedure: OPEN REDUCTION INTERNAL FIXATION (ORIF) DISTAL RADIAL FRACTURE AND REPAIR AS NEEDED;  Surgeon: Iran Planas, MD;  Location: [facility];  Service: Orthopedics;  Laterality: Left;    There were no vitals filed for this visit.  Visit Diagnosis:  Pain, joint, forearm, left  Decreased strength  Decreased grip strength of left hand  Decreased ROM of wrist  Decreased coordination      Subjective Assessment - 09/06/15 0815    Subjective  Pt reports her hand pain woke  her up in the night   Pertinent History ORIF Lt distal radius fx 06/21/15   Patient Stated Goals decrease pain and get my hand working    Currently in Pain? Yes   Pain Score 3    Pain Location Wrist   Pain Orientation Left   Pain Descriptors / Indicators Aching   Pain Type Acute pain   Pain Onset More than a month ago   Pain Frequency Intermittent   Aggravating Factors  aches at night, stiff in the morning   Pain Relieving Factors pain meds   Multiple Pain Sites No           Hand Exercises    Other Hand Exercises  Gripper set at 20 lbs resistance.  Improved rate, with infrequent drops.   P/ROM prayer stretch followed by therapist performing P/ROM wrist flexion/ extension with gentle distraction 3 lb resistance with dumbbell to wrist flex / ext x 2 sets of  10 reps.      Other Hand Exercises  Wrist winder through three full rotations with  2 lb resistance.   Arm bike x 5 mins level 3 for reciprocal movement/ conditioning   LUE Paraffin    Number Minutes Paraffin  10 Minutes    LUE Paraffin Location  Hand;Wrist    Comments  at beginning of session to reduce stiffness and pain  OT Short Term Goals - 08/23/15 0850    OT SHORT TERM GOAL #1   Title Pt will be independent with initial HEP   Time 4   Period Weeks   Status Achieved   OT SHORT TERM GOAL #2   Title Pt's 9-hole peg test will decrease by 20 seconds   Baseline 1 minute 24 seconds   Time 4   Period Weeks   Status Achieved  08/16/15: 28.75 sec   OT SHORT TERM GOAL #3   Title Pt's left wrist extension AROM will improve to 40*   Baseline 15*   Time 4   Period Weeks   Status Achieved  08/16/15: Lt wrist ext = 32 degrees, 08/23/15: 40 Degrees   OT SHORT TERM GOAL #4   Title Pt's forearm supination AROM will improve to 80*   Baseline 70*   Time 4   Period Weeks   Status Achieved  08/16/15: 85 degrees   OT SHORT TERM GOAL #5   Title Pt will be educated on ways to  increase UB/LB bathing and dressing tasks in order to increase independence with ADLs    Baseline min assist with UB/LB ADLs   Time 4   Period Weeks   Status Achieved           OT Long Term Goals - 08/23/15 [id number]    OT LONG TERM GOAL #1   Title Pt will be independent with modified HEP   Time 8   Period Weeks   Status On-going   OT LONG TERM GOAL #2   Title Pt will be able to lift up to 40lb object using bilateral UEs in order to go back to work soon   Time 8   Period Weeks   Status On-going   OT LONG TERM GOAL #3   Title Patient will complete 9-hole peg test in less than 80 seconds   Baseline 1 minute 24 seconds   Time 8   Period Weeks   Status Achieved  08/16/15: 28.75 sec.    OT LONG TERM GOAL #4   Title Pt will be independent with UB/LB ADLs   Time 8   Period Weeks   Status On-going   OT LONG TERM GOAL #5   Title Pt's left wrist extension AROM will improve to 50*   Baseline 15*   Time 8   Period Weeks   Status Revised               Plan - 09/06/15 0831    Clinical Impression Statement Pt is progressing towards goals, yet she continues to be limited by pain and stiffness.   Pt will benefit from skilled therapeutic intervention in order to improve on the following deficits (Retired) Decreased coordination;Decreased range of motion;Impaired flexibility;Decreased endurance;Increased edema;Impaired sensation;Decreased activity tolerance;Decreased scar mobility;Impaired UE functional use;Pain;Decreased mobility;Decreased strength   Rehab Potential Good   OT Frequency --  2-3x    OT Duration 6 weeks   OT Treatment/Interventions Self-care/ADL training;Therapeutic exercise;Patient/family education;Ultrasound;Manual Therapy;Splinting;Parrafin;DME and/or AE instruction;Cryotherapy;Compression bandaging;Therapeutic activities;Electrical Stimulation;Fluidtherapy;Scar mobilization;Moist Heat;Contrast Bath;Passive range of motion   Plan continue modalities, ROM,  strengthening left wrist / forearm and digits   OT Home Exercise Plan A/ROM and P/ROM streches (handout given), theraputty strengthening exercises (handout given) - 08/09/15   Consulted and Agree with Plan of Care Patient        Problem List Patient Active Problem List   Diagnosis Date Noted  . Distal radius fracture, left 06/21/2015  . Hyperlipidemia  06/16/2014  . Essential hypertension 06/16/2014  . Diabetes mellitus type 2, insulin dependent ([place]) 03/24/2014    Naraly Fritcher 09/06/2015, 8:34 AM Theone Murdoch, OTR/L [phone] Phone: [phone] 8:34 AM 09/06/2015 [facility] [address] [place], [place], [address] Phone: [phone]   Fax:  [phone]  Name: Brandy Burns MRN: [id number] Date of Birth: Jul 17, 1960

## 2015-09-11 ENCOUNTER — Encounter: Payer: Self-pay | Admitting: Occupational Therapy

## 2015-09-11 ENCOUNTER — Ambulatory Visit: Payer: PRIVATE HEALTH INSURANCE | Attending: Orthopedic Surgery | Admitting: Occupational Therapy

## 2015-09-11 DIAGNOSIS — M25632 Stiffness of left wrist, not elsewhere classified: Secondary | ICD-10-CM | POA: Diagnosis present

## 2015-09-11 DIAGNOSIS — M79602 Pain in left arm: Secondary | ICD-10-CM | POA: Insufficient documentation

## 2015-09-11 DIAGNOSIS — M25532 Pain in left wrist: Secondary | ICD-10-CM | POA: Insufficient documentation

## 2015-09-11 NOTE — Therapy (Signed)
[facility] [address] [place], [place], [address] Phone: [phone]   Fax:  [phone]  Occupational Therapy Treatment  Patient Details  Name: TAMILA HOLSEN MRN: [id number] Date of Birth: Dec 16, 1960 Referring Provider: Iran Planas  Encounter Date: 09/11/2015      OT End of Session - 09/11/15 1025    Visit Number 13   Number of Visits 16   Date for OT Re-Evaluation 09/12/15   Authorization Type UMR - authorized 8 initial visits, Sydnee Levans (case manager) approved an additional 8 visits as of 08/23/15   Authorization - Visit Number 54   Authorization - Number of Visits 16   OT Start Time 0930   OT Stop Time 1015   OT Time Calculation (min) 45 min   Activity Tolerance Patient tolerated treatment well      Past Medical History  Diagnosis Date  . GERD (gastroesophageal reflux disease)   . Hyperlipemia   . Essential hypertension 06/16/2014  . Asthma   . Diabetes mellitus     type 2  . Kidney stones   . Anemia     as a child    Past Surgical History  Procedure Laterality Date  . Tonsillectomy    . Tubal ligation    . Ectopic pregnancy surgery    . Colonoscopy  09/23/2011    Procedure: COLONOSCOPY;  Surgeon: Juanita Craver, MD;  Location: [facility];  Service: Endoscopy;  Laterality: N/A;  . Open reduction internal fixation (orif) distal radial fracture Left 06/21/2015    Procedure: OPEN REDUCTION INTERNAL FIXATION (ORIF) DISTAL RADIAL FRACTURE AND REPAIR AS NEEDED;  Surgeon: Iran Planas, MD;  Location: [facility];  Service: Orthopedics;  Laterality: Left;    There were no vitals filed for this visit.  Visit Diagnosis:  Pain, joint, forearm, left  Stiffness of left wrist, not elsewhere classified      Subjective Assessment - 09/11/15 0944    Subjective  I also have pain in my arm down to my wrist (Noted deltoid insertion tightness)   Pertinent History ORIF Lt distal radius fx 06/21/15   Patient Stated  Goals decrease pain and get my hand working    Currently in Pain? Yes   Pain Score 2    Pain Location Wrist   Pain Orientation Left   Pain Descriptors / Indicators Aching   Pain Type Acute pain   Pain Onset More than a month ago   Pain Frequency Intermittent   Aggravating Factors  aches at night, stiff in the am   Pain Relieving Factors pain meds, heat            [facility] OT Assessment - 09/11/15 0001    Hand Function   Right Hand Grip (lbs) 65 lbs   Left Hand Grip (lbs) 15 lbs                  OT Treatments/Exercises (OP) - 09/11/15 0001    Wrist Exercises   Other wrist exercises forearm gym for combined wrist and forearm movement x 4 reps followed by hammer stretch for pronation and supination x 5 reps each way holding 10 sec. Prayer stretch for passive wrist ext and passive wrist flexion self stretch x 5 reps each way, holding 10 sec.    Other wrist exercises wrist flex/ext strengthening with 3 lb. weight x 15 reps each way. Followed by weighted stretches with 4 lb. weight x 5 reps, holding 10 sec. each way.  LUE Paraffin   Number Minutes Paraffin 10 Minutes   LUE Paraffin Location Hand;Wrist   Comments at beginning of session to decrease pain and stiffness   Manual Therapy   Manual Therapy Taping   Kinesiotex Inhibit Muscle  deltoid                  OT Short Term Goals - 08/23/15 0850    OT SHORT TERM GOAL #1   Title Pt will be independent with initial HEP   Time 4   Period Weeks   Status Achieved   OT SHORT TERM GOAL #2   Title Pt's 9-hole peg test will decrease by 20 seconds   Baseline 1 minute 24 seconds   Time 4   Period Weeks   Status Achieved  08/16/15: 28.75 sec   OT SHORT TERM GOAL #3   Title Pt's left wrist extension AROM will improve to 40*   Baseline 15*   Time 4   Period Weeks   Status Achieved  08/16/15: Lt wrist ext = 32 degrees, 08/23/15: 40 Degrees   OT SHORT TERM GOAL #4   Title Pt's forearm supination AROM will improve to  80*   Baseline 70*   Time 4   Period Weeks   Status Achieved  08/16/15: 85 degrees   OT SHORT TERM GOAL #5   Title Pt will be educated on ways to increase UB/LB bathing and dressing tasks in order to increase independence with ADLs    Baseline min assist with UB/LB ADLs   Time 4   Period Weeks   Status Achieved           OT Long Term Goals - 08/23/15 [id number]    OT LONG TERM GOAL #1   Title Pt will be independent with modified HEP   Time 8   Period Weeks   Status On-going   OT LONG TERM GOAL #2   Title Pt will be able to lift up to 40lb object using bilateral UEs in order to go back to work soon   Time 8   Period Weeks   Status On-going   OT LONG TERM GOAL #3   Title Patient will complete 9-hole peg test in less than 80 seconds   Baseline 1 minute 24 seconds   Time 8   Period Weeks   Status Achieved  08/16/15: 28.75 sec.    OT LONG TERM GOAL #4   Title Pt will be independent with UB/LB ADLs   Time 8   Period Weeks   Status On-going   OT LONG TERM GOAL #5   Title Pt's left wrist extension AROM will improve to 50*   Baseline 15*   Time 8   Period Weeks   Status Revised               Plan - 09/11/15 1026    Clinical Impression Statement Pt progressing towards goals yet continues to be limited by stiffness   Plan assess kinesiotape, continue paraffin, ROM, strengthening   OT Home Exercise Plan A/ROM and P/ROM streches (handout given), theraputty strengthening exercises (handout given) - 08/09/15   Consulted and Agree with Plan of Care Patient        Problem List Patient Active Problem List   Diagnosis Date Noted  . Distal radius fracture, left 06/21/2015  . Hyperlipidemia 06/16/2014  . Essential hypertension 06/16/2014  . Diabetes mellitus type 2, insulin dependent ([place]) 03/24/2014    Carey Bullocks, OTR/L 09/11/2015, 10:27  [facility] [address] [place], [place],  [address] Phone: [phone]   Fax:  [phone]  Name: IMANA MINARIK MRN: [id number] Date of Birth: 19-Oct-1960

## 2015-09-13 ENCOUNTER — Encounter: Payer: Self-pay | Admitting: Occupational Therapy

## 2015-09-13 ENCOUNTER — Ambulatory Visit: Payer: PRIVATE HEALTH INSURANCE | Attending: Family Medicine | Admitting: Occupational Therapy

## 2015-09-13 DIAGNOSIS — M25632 Stiffness of left wrist, not elsewhere classified: Secondary | ICD-10-CM | POA: Diagnosis not present

## 2015-09-13 DIAGNOSIS — M6281 Muscle weakness (generalized): Secondary | ICD-10-CM | POA: Diagnosis present

## 2015-09-13 DIAGNOSIS — M25532 Pain in left wrist: Secondary | ICD-10-CM | POA: Diagnosis present

## 2015-09-13 DIAGNOSIS — R29898 Other symptoms and signs involving the musculoskeletal system: Secondary | ICD-10-CM | POA: Diagnosis present

## 2015-09-13 NOTE — Therapy (Signed)
[facility] [address] [place], [place], [address] Phone: [phone]   Fax:  [phone]  Occupational Therapy Treatment  Patient Details  Name: Brandy Burns MRN: [id number] Date of Birth: 09-Nov-1960 Referring Provider: Iran Planas  Encounter Date: 09/13/2015      OT End of Session - 09/13/15 0840    Visit Number 14   Number of Visits 16   Date for OT Re-Evaluation 09/12/15   Authorization Type UMR - authorized 8 initial visits, Sydnee Levans (case manager) approved an additional 8 visits as of 08/23/15   Authorization - Visit Number 45   Authorization - Number of Visits 16   OT Start Time 0800   OT Stop Time [id number]   OT Time Calculation (min) 47 min   Activity Tolerance Patient tolerated treatment well      Past Medical History  Diagnosis Date  . GERD (gastroesophageal reflux disease)   . Hyperlipemia   . Essential hypertension 06/16/2014  . Asthma   . Diabetes mellitus     type 2  . Kidney stones   . Anemia     as a child    Past Surgical History  Procedure Laterality Date  . Tonsillectomy    . Tubal ligation    . Ectopic pregnancy surgery    . Colonoscopy  09/23/2011    Procedure: COLONOSCOPY;  Surgeon: Juanita Craver, MD;  Location: [facility];  Service: Endoscopy;  Laterality: N/A;  . Open reduction internal fixation (orif) distal radial fracture Left 06/21/2015    Procedure: OPEN REDUCTION INTERNAL FIXATION (ORIF) DISTAL RADIAL FRACTURE AND REPAIR AS NEEDED;  Surgeon: Iran Planas, MD;  Location: [facility];  Service: Orthopedics;  Laterality: Left;    There were no vitals filed for this visit.  Visit Diagnosis:  Stiffness of left wrist, not elsewhere classified  Pain, joint, forearm, left  Muscle weakness (generalized)  Other symptoms and signs involving the musculoskeletal system      Subjective Assessment - 09/13/15 0807    Subjective  My arm up to my shoulder has gotten worse in the last  couple of weeks. The taping helped some, but still hurts.   Pertinent History ORIF Lt distal radius fx 06/21/15   Patient Stated Goals decrease pain and get my hand working    Currently in Pain? Yes   Pain Score 3    Pain Location Arm   Pain Orientation Left   Pain Descriptors / Indicators Aching;Throbbing   Pain Onset More than a month ago   Pain Frequency Intermittent   Aggravating Factors  Aches at night, stiff in the am   Pain Relieving Factors pain meds, heat                      OT Treatments/Exercises (OP) - 09/13/15 0001    Exercises   Exercises Shoulder   Shoulder Exercises: ROM/Strengthening   Other ROM/Strengthening Exercises Wall slides LUE in shoulder flex/ext followed by circumduction exercises due to stiffness and pain Lt arm   Other ROM/Strengthening Exercises UBE x 8 min Level 3 for ROM/strength    Wrist Exercises   Other wrist exercises Prayer stretch followed by wrist flexion self stretch x 3 reps each holding 10 sec.    Other wrist exercises wrist winder fully up and down with 2 lb. weight    Hand Exercises   Other Hand Exercises Gripper set at 20 lbs resistance to pick up approx. 1/2 amount of blocks with  only min drops Lt hand for sustained grip strength   LUE Paraffin   Number Minutes Paraffin 10 Minutes   LUE Paraffin Location Hand;Wrist   Comments at beginning of session to decrease pain and stiffness                  OT Short Term Goals - 08/23/15 0850    OT SHORT TERM GOAL #1   Title Pt will be independent with initial HEP   Time 4   Period Weeks   Status Achieved   OT SHORT TERM GOAL #2   Title Pt's 9-hole peg test will decrease by 20 seconds   Baseline 1 minute 24 seconds   Time 4   Period Weeks   Status Achieved  08/16/15: 28.75 sec   OT SHORT TERM GOAL #3   Title Pt's left wrist extension AROM will improve to 40*   Baseline 15*   Time 4   Period Weeks   Status Achieved  08/16/15: Lt wrist ext = 32 degrees, 08/23/15:  40 Degrees   OT SHORT TERM GOAL #4   Title Pt's forearm supination AROM will improve to 80*   Baseline 70*   Time 4   Period Weeks   Status Achieved  08/16/15: 85 degrees   OT SHORT TERM GOAL #5   Title Pt will be educated on ways to increase UB/LB bathing and dressing tasks in order to increase independence with ADLs    Baseline min assist with UB/LB ADLs   Time 4   Period Weeks   Status Achieved           OT Long Term Goals - 08/23/15 [id number]    OT LONG TERM GOAL #1   Title Pt will be independent with modified HEP   Time 8   Period Weeks   Status On-going   OT LONG TERM GOAL #2   Title Pt will be able to lift up to 40lb object using bilateral UEs in order to go back to work soon   Time 8   Period Weeks   Status On-going   OT LONG TERM GOAL #3   Title Patient will complete 9-hole peg test in less than 80 seconds   Baseline 1 minute 24 seconds   Time 8   Period Weeks   Status Achieved  08/16/15: 28.75 sec.    OT LONG TERM GOAL #4   Title Pt will be independent with UB/LB ADLs   Time 8   Period Weeks   Status On-going   OT LONG TERM GOAL #5   Title Pt's left wrist extension AROM will improve to 50*   Baseline 15*   Time 8   Period Weeks   Status Revised               Plan - 09/13/15 0840    Clinical Impression Statement Pt with increased onset of Lt arm pain within the past 2 weeks (elbow to shoulder). Recommend pt discuss with MD at next appt - pt agrees. (? potential CRPS)   Plan Continue wrist ROM and strengthening, monitor Lt arm pain (anticipate placing on hold after next week vs. d/c until pt sees MD, secondary to onset of Lt arm pain)   OT Home Exercise Plan A/ROM and P/ROM streches (handout given), theraputty strengthening exercises (handout given) - 08/09/15   Consulted and Agree with Plan of Care Patient        Problem List Patient Active Problem List   Diagnosis  Date Noted  . Distal radius fracture, left 06/21/2015  . Hyperlipidemia 06/16/2014   . Essential hypertension 06/16/2014  . Diabetes mellitus type 2, insulin dependent ([facility]) 03/24/2014    Carey Bullocks, OTR/L 09/13/2015, 8:50 AM  [facility] [address] [place], [place], [address] Phone: [phone]   Fax:  [phone]  Name: Brandy Burns MRN: [id number] Date of Birth: May 04, 1961

## 2015-09-18 ENCOUNTER — Encounter: Payer: Self-pay | Admitting: Occupational Therapy

## 2015-09-18 ENCOUNTER — Ambulatory Visit: Payer: PRIVATE HEALTH INSURANCE | Attending: Physician Assistant | Admitting: Occupational Therapy

## 2015-09-18 DIAGNOSIS — M25512 Pain in left shoulder: Secondary | ICD-10-CM | POA: Insufficient documentation

## 2015-09-18 DIAGNOSIS — M25612 Stiffness of left shoulder, not elsewhere classified: Secondary | ICD-10-CM | POA: Diagnosis present

## 2015-09-18 DIAGNOSIS — R29898 Other symptoms and signs involving the musculoskeletal system: Secondary | ICD-10-CM

## 2015-09-18 DIAGNOSIS — M6281 Muscle weakness (generalized): Secondary | ICD-10-CM | POA: Diagnosis present

## 2015-09-18 DIAGNOSIS — M25632 Stiffness of left wrist, not elsewhere classified: Secondary | ICD-10-CM | POA: Diagnosis not present

## 2015-09-18 DIAGNOSIS — M79602 Pain in left arm: Secondary | ICD-10-CM

## 2015-09-18 DIAGNOSIS — M25532 Pain in left wrist: Secondary | ICD-10-CM

## 2015-09-18 NOTE — Addendum Note (Signed)
Addended by: Hans Eden on: 09/18/2015 08:42 AM   Modules accepted: Orders

## 2015-09-18 NOTE — Therapy (Signed)
[facility] [address] [place], [place], [address] Phone: [phone]   Fax:  [phone]  Occupational Therapy Treatment  Patient Details  Name: GENERA BAULT MRN: [id number] Date of Birth: 1961-02-09 Referring Provider: Iran Planas  Encounter Date: 09/18/2015      OT End of Session - 09/18/15 1009    Visit Number 15   Number of Visits 16   Date for OT Re-Evaluation 09/12/15   Authorization Type UMR - authorized 8 initial visits, Sydnee Levans (case manager) approved an additional 8 visits as of 08/23/15   Authorization - Visit Number 15   Authorization - Number of Visits 16   OT Start Time [id number]   OT Stop Time 1015   OT Time Calculation (min) 40 min   Activity Tolerance Patient tolerated treatment well      Past Medical History  Diagnosis Date  . GERD (gastroesophageal reflux disease)   . Hyperlipemia   . Essential hypertension 06/16/2014  . Asthma   . Diabetes mellitus     type 2  . Kidney stones   . Anemia     as a child    Past Surgical History  Procedure Laterality Date  . Tonsillectomy    . Tubal ligation    . Ectopic pregnancy surgery    . Colonoscopy  09/23/2011    Procedure: COLONOSCOPY;  Surgeon: Juanita Craver, MD;  Location: [facility];  Service: Endoscopy;  Laterality: N/A;  . Open reduction internal fixation (orif) distal radial fracture Left 06/21/2015    Procedure: OPEN REDUCTION INTERNAL FIXATION (ORIF) DISTAL RADIAL FRACTURE AND REPAIR AS NEEDED;  Surgeon: Iran Planas, MD;  Location: [facility];  Service: Orthopedics;  Laterality: Left;    There were no vitals filed for this visit.      Subjective Assessment - 09/18/15 0939    Pertinent History ORIF Lt distal radius fx 06/21/15   Patient Stated Goals decrease pain and get my hand working    Currently in Pain? Yes   Pain Score 2    Pain Location Arm  and wrist   Pain Orientation Left   Pain Descriptors / Indicators  Aching;Throbbing   Pain Type Acute pain   Pain Onset More than a month ago   Pain Frequency Intermittent   Aggravating Factors  aches at night, stiff in the am   Pain Relieving Factors pain meds, heat                      OT Treatments/Exercises (OP) - 09/18/15 0001    Shoulder Exercises: ROM/Strengthening   Other ROM/Strengthening Exercises Wall slides LUE in shoulder flex/ext followed by circumduction exercises due to stiffness and pain Lt arm. Followed by towel stretch to increase IR LUE   Other ROM/Strengthening Exercises UBE x 10 min Level 5 for ROM/strength and to decrease pain Lt arm   Wrist Exercises   Other wrist exercises Prayer stretch followed by wrist flexion self stretch x 3 reps each holding 10 sec.    Other wrist exercises wrist flexion and extension x 15 reps each with 3 lb. weight   Hand Exercises   Other Hand Exercises Gripper set at 20 lbs resistance to pick up blocks with only 2  drops Lt hand for sustained grip strength   LUE Paraffin   Number Minutes Paraffin 10 Minutes   LUE Paraffin Location Hand;Wrist   Comments at beginning of session to decrease pain and stiffness  OT Short Term Goals - 08/23/15 0850    OT SHORT TERM GOAL #1   Title Pt will be independent with initial HEP   Time 4   Period Weeks   Status Achieved   OT SHORT TERM GOAL #2   Title Pt's 9-hole peg test will decrease by 20 seconds   Baseline 1 minute 24 seconds   Time 4   Period Weeks   Status Achieved  08/16/15: 28.75 sec   OT SHORT TERM GOAL #3   Title Pt's left wrist extension AROM will improve to 40*   Baseline 15*   Time 4   Period Weeks   Status Achieved  08/16/15: Lt wrist ext = 32 degrees, 08/23/15: 40 Degrees   OT SHORT TERM GOAL #4   Title Pt's forearm supination AROM will improve to 80*   Baseline 70*   Time 4   Period Weeks   Status Achieved  08/16/15: 85 degrees   OT SHORT TERM GOAL #5   Title Pt will be educated on ways to  increase UB/LB bathing and dressing tasks in order to increase independence with ADLs    Baseline min assist with UB/LB ADLs   Time 4   Period Weeks   Status Achieved           OT Long Term Goals - 08/23/15 [id number]    OT LONG TERM GOAL #1   Title Pt will be independent with modified HEP   Time 8   Period Weeks   Status On-going   OT LONG TERM GOAL #2   Title Pt will be able to lift up to 40lb object using bilateral UEs in order to go back to work soon   Time 8   Period Weeks   Status On-going   OT LONG TERM GOAL #3   Title Patient will complete 9-hole peg test in less than 80 seconds   Baseline 1 minute 24 seconds   Time 8   Period Weeks   Status Achieved  08/16/15: 28.75 sec.    OT LONG TERM GOAL #4   Title Pt will be independent with UB/LB ADLs   Time 8   Period Weeks   Status On-going   OT LONG TERM GOAL #5   Title Pt's left wrist extension AROM will improve to 50*   Baseline 15*   Time 8   Period Weeks   Status Revised               Plan - 09/18/15 1010    Clinical Impression Statement Pt progressing with ROM and strength Lt wrist. Pt with slightly improved LUE pain   Plan assess LTG's and place on hold until pt sees MD   OT Home Exercise Plan A/ROM and P/ROM streches (handout given), theraputty strengthening exercises (handout given) - 08/09/15   Consulted and Agree with Plan of Care Patient      Patient will benefit from skilled therapeutic intervention in order to improve the following deficits and impairments:  Decreased coordination, Decreased range of motion, Impaired flexibility, Decreased endurance, Increased edema, Impaired sensation, Decreased activity tolerance, Decreased scar mobility, Impaired UE functional use, Pain, Decreased mobility, Decreased strength  Visit Diagnosis: Stiffness of left wrist, not elsewhere classified  Pain, joint, forearm, left  Muscle weakness (generalized)  Other symptoms and signs involving the musculoskeletal  system  Pain In Left Arm    Problem List Patient Active Problem List   Diagnosis Date Noted  . Distal radius fracture, left 06/21/2015  .  Hyperlipidemia 06/16/2014  . Essential hypertension 06/16/2014  . Diabetes mellitus type 2, insulin dependent ([facility]) 03/24/2014    Carey Bullocks, OTR/L 09/18/2015, 10:13 AM  [facility] [address] [place], [place], [address] Phone: [phone]   Fax:  [phone]  Name: MATTALYNN BAUMEL MRN: [id number] Date of Birth: 1961-04-07

## 2015-09-20 ENCOUNTER — Encounter: Payer: Self-pay | Admitting: Occupational Therapy

## 2015-09-20 ENCOUNTER — Ambulatory Visit: Payer: PRIVATE HEALTH INSURANCE | Admitting: Occupational Therapy

## 2015-09-20 DIAGNOSIS — M25632 Stiffness of left wrist, not elsewhere classified: Secondary | ICD-10-CM

## 2015-09-20 DIAGNOSIS — M6281 Muscle weakness (generalized): Secondary | ICD-10-CM

## 2015-09-20 DIAGNOSIS — M25532 Pain in left wrist: Secondary | ICD-10-CM

## 2015-09-20 DIAGNOSIS — R29898 Other symptoms and signs involving the musculoskeletal system: Secondary | ICD-10-CM

## 2015-09-20 NOTE — Therapy (Signed)
[facility] [address] [place], [place], [address] Phone: [phone]   Fax:  [phone]  Occupational Therapy Treatment  Patient Details  Name: Brandy Burns MRN: [id number] Date of Birth: 28-Jul-1960 Referring Provider: Iran Planas  Encounter Date: 09/20/2015      OT End of Session - 09/20/15 0834    Visit Number 16   Number of Visits 16   Date for OT Re-Evaluation 09/12/15   Authorization Type [employer] - authorized 8 initial visits, Sydnee Levans (case manager) approved an additional 8 visits as of 08/23/15   Authorization - Visit Number 71   Authorization - Number of Visits 16   OT Start Time 0800   OT Stop Time 0845   OT Time Calculation (min) 45 min   Activity Tolerance Patient tolerated treatment well      Past Medical History  Diagnosis Date  . GERD (gastroesophageal reflux disease)   . Hyperlipemia   . Essential hypertension 06/16/2014  . Asthma   . Diabetes mellitus     type 2  . Kidney stones   . Anemia     as a child    Past Surgical History  Procedure Laterality Date  . Tonsillectomy    . Tubal ligation    . Ectopic pregnancy surgery    . Colonoscopy  09/23/2011    Procedure: COLONOSCOPY;  Surgeon: Juanita Craver, MD;  Location: [facility];  Service: Endoscopy;  Laterality: N/A;  . Open reduction internal fixation (orif) distal radial fracture Left 06/21/2015    Procedure: OPEN REDUCTION INTERNAL FIXATION (ORIF) DISTAL RADIAL FRACTURE AND REPAIR AS NEEDED;  Surgeon: Iran Planas, MD;  Location: [facility];  Service: Orthopedics;  Laterality: Left;    There were no vitals filed for this visit.      Subjective Assessment - 09/20/15 0805    Pertinent History ORIF Lt distal radius fx 06/21/15   Patient Stated Goals decrease pain and get my hand working    Currently in Pain? Yes   Pain Score 2    Pain Location Arm  and wrist   Pain Orientation Left   Pain Descriptors / Indicators  Aching;Throbbing   Pain Type Acute pain   Pain Onset More than a month ago   Pain Frequency Intermittent   Aggravating Factors  worse in the am   Pain Relieving Factors pain meds, heat            [facility] OT Assessment - 09/20/15 0001    Hand Function   Left Hand Grip (lbs) 17 lbs (only after stretching)                  OT Treatments/Exercises (OP) - 09/20/15 0001    ADLs   Functional Mobility Picking up and carrying 40 lb. box BUE's from floor with min difficulty LUE.    ADL Comments Assessed LTG's and progress to date. (see LTG's)   Shoulder Exercises: ROM/Strengthening   UBE (Upper Arm Bike) x 10 minutes  Level 5 for ROM/strength and to decr pain Lt arm   Wrist Exercises   Other wrist exercises Prayer stretch followed by wrist flexion self stretch x 3 reps each holding 10 sec.    Other wrist exercises wrist flexion and extension x 15 reps each with 3 lb. weight   LUE Paraffin   Number Minutes Paraffin 10 Minutes   LUE Paraffin Location Hand;Wrist   Comments at beginning of session to decrease pain and stiffness  OT Short Term Goals - 08/23/15 0850    OT SHORT TERM GOAL #1   Title Pt will be independent with initial HEP   Time 4   Period Weeks   Status Achieved   OT SHORT TERM GOAL #2   Title Pt's 9-hole peg test will decrease by 20 seconds   Baseline 1 minute 24 seconds   Time 4   Period Weeks   Status Achieved  08/16/15: 28.75 sec   OT SHORT TERM GOAL #3   Title Pt's left wrist extension AROM will improve to 40*   Baseline 15*   Time 4   Period Weeks   Status Achieved  08/16/15: Lt wrist ext = 32 degrees, 08/23/15: 40 Degrees   OT SHORT TERM GOAL #4   Title Pt's forearm supination AROM will improve to 80*   Baseline 70*   Time 4   Period Weeks   Status Achieved  08/16/15: 85 degrees   OT SHORT TERM GOAL #5   Title Pt will be educated on ways to increase UB/LB bathing and dressing tasks in order to increase independence with  ADLs    Baseline min assist with UB/LB ADLs   Time 4   Period Weeks   Status Achieved           OT Long Term Goals - 09/20/15 9622    OT LONG TERM GOAL #1   Title Pt will be independent with modified HEP   Time 8   Period Weeks   Status Achieved   OT LONG TERM GOAL #2   Title Pt will be able to lift up to 40lb object using bilateral UEs in order to go back to work soon   Time 8   Period Weeks   Status Achieved   OT LONG TERM GOAL #3   Title Patient will complete 9-hole peg test in less than 80 seconds   Baseline 1 minute 24 seconds   Time 8   Period Weeks   Status Achieved  08/16/15: 28.75 sec.    OT LONG TERM GOAL #4   Title Pt will be independent with UB/LB ADLs   Time 8   Period Weeks   Status Achieved   OT LONG TERM GOAL #5   Title Pt's left wrist extension AROM will improve to 50*   Baseline 15*   Time 8   Period Weeks   Status Not Met  09/20/15: wrist ext = 45*               Plan - 09/20/15 0834    Clinical Impression Statement Pt met 4/5 LTG's. Wrist extension remains 45 degrees. Pt still with c/o pain in upper arm - will discuss with MD next week.    Plan Place on hold until after MD sees next week - pt to address upper arm pain with MD. If therapy needed for this, will continue with new referral. Otherwise, anticipate d/c from O.T. at this time. Pt instructed to continue working on grip strength and stretches/strengthening for wrist ROM. Pt agrees with plan   OT Home Exercise Plan A/ROM and P/ROM streches (handout given), theraputty strengthening exercises (handout given) - 08/09/15   Consulted and Agree with Plan of Care Patient      Patient will benefit from skilled therapeutic intervention in order to improve the following deficits and impairments:  Decreased coordination, Decreased range of motion, Impaired flexibility, Decreased endurance, Increased edema, Impaired sensation, Decreased activity tolerance, Decreased scar mobility, Impaired UE  functional use, Pain, Decreased mobility, Decreased strength  Visit Diagnosis: Stiffness of left wrist, not elsewhere classified  Pain, joint, forearm, left  Muscle weakness (generalized)  Other symptoms and signs involving the musculoskeletal system    Problem List Patient Active Problem List   Diagnosis Date Noted  . Distal radius fracture, left 06/21/2015  . Hyperlipidemia 06/16/2014  . Essential hypertension 06/16/2014  . Diabetes mellitus type 2, insulin dependent ([place]) 03/24/2014    Carey Bullocks, OTR/L 09/20/2015, 8:39 AM  [facility] [address] [place], [place], [address] Phone: [phone]   Fax:  [phone]  Name: Brandy Burns MRN: [id number] Date of Birth: 10/06/1960

## 2015-09-26 MED FILL — NOVOLOG FLEXPEN SYRINGE: 100 | 90 days supply | Qty: 15 | Fill #1

## 2015-09-26 MED FILL — PANTOPRAZOLE SOD DR 40 MG T: 40 | 90 days supply | Qty: 90 | Fill #0

## 2015-09-26 MED FILL — traMADol HCL 50 MG TABS: 50 | 30 days supply | Qty: 60 | Fill #0

## 2015-09-26 MED FILL — LISINOPRIL 10 MG TABLET: 10 | 90 days supply | Qty: 90 | Fill #1

## 2015-09-26 MED FILL — SIMVASTATIN 40 MG TABLET: 40 | 90 days supply | Qty: 90 | Fill #1

## 2015-09-26 MED FILL — JANUVIA 100 MG TABLET: 100 | 90 days supply | Qty: 90 | Fill #0

## 2015-09-26 MED FILL — metFORMIN HCL 1000 MG TABS: 1000 | 90 days supply | Qty: 180 | Fill #1

## 2015-09-26 MED FILL — LANTUS SOLOSTAR 100 UNITS/M: 100 | 90 days supply | Qty: 30 | Fill #2

## 2015-09-27 DIAGNOSIS — K219 Gastro-esophageal reflux disease without esophagitis: Secondary | ICD-10-CM | POA: Diagnosis not present

## 2015-09-27 DIAGNOSIS — E7800 Pure hypercholesterolemia, unspecified: Secondary | ICD-10-CM | POA: Diagnosis not present

## 2015-09-27 DIAGNOSIS — Z720 Tobacco use: Secondary | ICD-10-CM | POA: Diagnosis not present

## 2015-09-27 DIAGNOSIS — Z7984 Long term (current) use of oral hypoglycemic drugs: Secondary | ICD-10-CM | POA: Diagnosis not present

## 2015-09-27 DIAGNOSIS — E1165 Type 2 diabetes mellitus with hyperglycemia: Secondary | ICD-10-CM | POA: Diagnosis not present

## 2015-09-27 DIAGNOSIS — Z794 Long term (current) use of insulin: Secondary | ICD-10-CM | POA: Diagnosis not present

## 2015-09-27 DIAGNOSIS — I10 Essential (primary) hypertension: Secondary | ICD-10-CM | POA: Diagnosis not present

## 2015-10-05 ENCOUNTER — Ambulatory Visit: Payer: PRIVATE HEALTH INSURANCE | Admitting: Physical Therapy

## 2015-10-05 ENCOUNTER — Encounter: Payer: Self-pay | Admitting: Physical Therapy

## 2015-10-05 DIAGNOSIS — M25612 Stiffness of left shoulder, not elsewhere classified: Secondary | ICD-10-CM

## 2015-10-05 DIAGNOSIS — M25512 Pain in left shoulder: Secondary | ICD-10-CM

## 2015-10-05 DIAGNOSIS — M25632 Stiffness of left wrist, not elsewhere classified: Secondary | ICD-10-CM | POA: Diagnosis not present

## 2015-10-05 DIAGNOSIS — R29898 Other symptoms and signs involving the musculoskeletal system: Secondary | ICD-10-CM

## 2015-10-05 DIAGNOSIS — M6281 Muscle weakness (generalized): Secondary | ICD-10-CM

## 2015-10-05 NOTE — Therapy (Signed)
[facility] [address] [place], [place], [address] Phone: [phone]   Fax:  [phone]  Physical Therapy Evaluation  Patient Details  Name: Brandy Burns MRN: [id number] Date of Birth: 10-Jun-1961 Referring Provider: Iran Planas, MD  Encounter Date: 10/05/2015      PT End of Session - 10/05/15 1132    Visit Number 1   Number of Visits 13   Authorization Type Worker's Compensation   PT Start Time 32   PT Stop Time 1058   PT Time Calculation (min) 39 min   Activity Tolerance Patient limited by pain   Behavior During Therapy [facility] for tasks assessed/performed      Past Medical History  Diagnosis Date  . GERD (gastroesophageal reflux disease)   . Hyperlipemia   . Essential hypertension 06/16/2014  . Asthma   . Diabetes mellitus     type 2  . Kidney stones   . Anemia     as a child    Past Surgical History  Procedure Laterality Date  . Tonsillectomy    . Tubal ligation    . Ectopic pregnancy surgery    . Colonoscopy  09/23/2011    Procedure: COLONOSCOPY;  Surgeon: Juanita Craver, MD;  Location: [facility];  Service: Endoscopy;  Laterality: N/A;  . Open reduction internal fixation (orif) distal radial fracture Left 06/21/2015    Procedure: OPEN REDUCTION INTERNAL FIXATION (ORIF) DISTAL RADIAL FRACTURE AND REPAIR AS NEEDED;  Surgeon: Iran Planas, MD;  Location: [facility];  Service: Orthopedics;  Laterality: Left;    There were no vitals filed for this visit.       Subjective Assessment - 10/05/15 1027    Subjective This 55yo female fell 06/14/2015 walking into work when she tripped on uneven concrete. She fractured her non-dominant left distal radius with ORIF. She has developed Adhesive Capsulitis of left shoulder. She presents to PT for evaluation.    Pertinent History DM2 insulin dependent, essential HTN, distal radius fracture with ORIF,    Limitations Lifting;House hold activities   Patient Stated  Goals She would like her left arm to get as close to normal as possible.    Currently in Pain? Yes   Pain Score 4   in last week, worst 10/10, best 0/10   Pain Location Shoulder   Pain Orientation Left  from scapula area to lower humerus   Pain Descriptors / Indicators Aching;Other (Comment)  pulling   Pain Type Acute pain   Pain Onset 1 to 4 weeks ago   Pain Frequency Intermittent   Aggravating Factors  moving arm overhead, back or quickly.   Pain Relieving Factors leaving it still, ice   Effect of Pain on Daily Activities limits use of left arm   Multiple Pain Sites Yes   Pain Score 0  in last week, worst 5/10, best 0/10   Pain Location Wrist   Pain Orientation Left   Pain Descriptors / Indicators Aching;Other (Comment)  stiffness   Pain Type Chronic pain   Pain Onset More than a month ago   Pain Frequency Intermittent   Aggravating Factors  lifting, turn it a certain way   Pain Relieving Factors rest   Effect of Pain on Daily Activities limits use            [facility] PT Assessment - 10/05/15 1015    Assessment   Medical Diagnosis Left Adhesive Capsulitis of shoulder   Referring Provider Iran Planas, MD   Onset Date/Surgical  Date 06/14/15   Hand Dominance Right   Next MD Visit 10/24/2015   Prior Therapy OT for wrist & hand   Precautions   Precautions None   Restrictions   Weight Bearing Restrictions No   Balance Screen   Has the patient fallen in the past 6 months Yes   How many times? 1 tripped   Has the patient had a decrease in activity level because of a fear of falling?  No   Is the patient reluctant to leave their home because of a fear of falling?  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    Leisure Firefighter, fishing,    Posture/Postural Control   Posture/Postural Control Postural limitations   Postural Limitations Rounded Shoulders;Forward head  head side bend to right   ROM / Strength   AROM / PROM / Strength AROM;PROM;Strength   AROM   AROM Assessment Site Shoulder   Right/Left Shoulder Left   Left Shoulder Extension 31 Degrees   Left Shoulder Flexion 91 Degrees   Left Shoulder ABduction 63 Degrees   Left Shoulder Internal Rotation 3 Degrees   Left Shoulder External Rotation 12 Degrees   Left Shoulder Horizontal ADduction 2 Degrees   PROM   Overall PROM  Deficits   PROM Assessment Site Shoulder   Right/Left Shoulder Left   Left Shoulder Extension 34 Degrees   Left Shoulder Flexion 95 Degrees   Left Shoulder ABduction 64 Degrees   Left Shoulder Internal Rotation 4 Degrees   Left Shoulder External Rotation 15 Degrees   Strength   Overall Strength Deficits   Overall Strength Comments left shoulder strength limited by pain   Strength Assessment Site Shoulder;Elbow   Right/Left Shoulder Left   Left Shoulder Flexion 4/5   Left Shoulder Extension 3/5   Left Shoulder ABduction 3-/5   Left Shoulder Internal Rotation 3-/5   Left Shoulder External Rotation 3-/5   Right/Left Elbow Left   Left Elbow Flexion 4/5   Left Elbow Extension 4/5   Palpation   Palpation comment Trigger points with tenderness along Upper Trapezuis, perimeter of scapula, axilla along both trunk & upper humerus, Pectoralis insertion, Biceps & Triceps   Special Tests    Special Tests Rotator Cuff Impingement   Rotator Cuff Impingment tests Empty Can test;Full Can test;Drop Arm test   Empty Can test   Findings Negative   Side Left   Full Can test   Findings Negative   Side Left   Drop Arm test   Findings Negative   Side Left                                PT Long Term Goals -  10/05/15 1150    PT LONG TERM GOAL #1   Title Patient improves left shoulder AROM to within 75% of normal ROM for non-dominant shoulder. (Target Date: 11/03/2015)   Time 4   Period Weeks   Status New   PT LONG TERM GOAL #2   Title Patient reports pain in left shoulder </= 3/10 with AROM.  (Target  Date: 11/03/2015)   Time 4   Period Weeks   Status New   PT LONG TERM GOAL #3   Title Patient able to lift 10# tray simulating job tasks with proper mechanics independently.  (Target Date: 11/03/2015)   Time 4   Period Weeks   Status New   PT LONG TERM GOAL #4   Title Patient demonstrates understanding of ongoing HEP for left shoulder ROM & strength and upper body posture.  (Target Date: 11/03/2015)   Time 4   Period Weeks   Status New               Plan - 10/05/15 1134    Clinical Impression Statement This 55yo female appears to have left non-dominant shoulder adhesive capsulitis that developed from disuse following a distal radius fracture. She has decreased AROM & PROM and strength limiting function of LUE for ADLs, homemaking tasks like mopping and work related tasks. Patient has pain and muscle trigger points compounding functional issues.    Rehab Potential Good   PT Frequency 3x / week  2-3 X/wk   PT Duration 4 weeks   PT Treatment/Interventions ADLs/Self Care Home Management;Electrical Stimulation;Cryotherapy;Moist Heat;Ultrasound;Iontophoresis 4mg /ml Dexamethasone;Therapeutic exercise;Neuromuscular re-education;Patient/family education;Manual techniques;Passive range of motion;Dry needling;Traction   PT Next Visit Plan ROM including instruction in HEP, posture education & modalities for pain   Consulted and Agree with Plan of Care Patient      Patient will benefit from skilled therapeutic intervention in order to improve the following deficits and impairments:  Decreased activity tolerance, Decreased range of motion, Decreased strength, Increased muscle spasms, Impaired  flexibility, Improper body mechanics, Postural dysfunction, Pain  Visit Diagnosis: Stiffness of left shoulder, not elsewhere classified  Pain in left shoulder  Muscle weakness (generalized)  Other symptoms and signs involving the musculoskeletal system     Problem List Patient Active Problem List   Diagnosis Date Noted  . Distal radius fracture, left 06/21/2015  . Hyperlipidemia 06/16/2014  . Essential hypertension 06/16/2014  . Diabetes mellitus type 2, insulin dependent ([place]) 03/24/2014    Iker Nuttall PT, DPT 10/05/2015, 11:57 AM  [facility] [address] [place], [place], [address] Phone: [phone]   Fax:  [phone]  Name: Brandy Burns MRN: [id number] Date of Birth: March 23, 1961

## 2015-10-10 ENCOUNTER — Ambulatory Visit: Payer: PRIVATE HEALTH INSURANCE | Attending: Orthopedic Surgery | Admitting: Physical Therapy

## 2015-10-10 ENCOUNTER — Encounter: Payer: Self-pay | Admitting: Physical Therapy

## 2015-10-10 DIAGNOSIS — M79622 Pain in left upper arm: Secondary | ICD-10-CM | POA: Insufficient documentation

## 2015-10-10 DIAGNOSIS — M6281 Muscle weakness (generalized): Secondary | ICD-10-CM | POA: Diagnosis present

## 2015-10-10 DIAGNOSIS — R29898 Other symptoms and signs involving the musculoskeletal system: Secondary | ICD-10-CM | POA: Diagnosis present

## 2015-10-10 DIAGNOSIS — M25512 Pain in left shoulder: Secondary | ICD-10-CM | POA: Insufficient documentation

## 2015-10-10 DIAGNOSIS — M25612 Stiffness of left shoulder, not elsewhere classified: Secondary | ICD-10-CM | POA: Diagnosis not present

## 2015-10-10 NOTE — Therapy (Signed)
[facility] [place], [place], [address] Phone: [phone]   Fax:  [phone]  Physical Therapy Treatment  Patient Details  Name: Brandy Burns MRN: [id number] Date of Birth: 11-11-1960 Referring Provider: Iran Planas, MD  Encounter Date: 10/10/2015      PT End of Session - 10/10/15 1324    Visit Number 2   Number of Visits 13   Date for PT Re-Evaluation 11/16/15   PT Start Time [id number]   PT Stop Time 1415   PT Time Calculation (min) 53 min      Past Medical History  Diagnosis Date  . GERD (gastroesophageal reflux disease)   . Hyperlipemia   . Essential hypertension 06/16/2014  . Asthma   . Diabetes mellitus     type 2  . Kidney stones   . Anemia     as a child    Past Surgical History  Procedure Laterality Date  . Tonsillectomy    . Tubal ligation    . Ectopic pregnancy surgery    . Colonoscopy  09/23/2011    Procedure: COLONOSCOPY;  Surgeon: Juanita Craver, MD;  [facility];  Service: Endoscopy;  Laterality: N/A;  . Open reduction internal fixation (orif) distal radial fracture Left 06/21/2015    Procedure: OPEN REDUCTION INTERNAL FIXATION (ORIF) DISTAL RADIAL FRACTURE AND REPAIR AS NEEDED;  Surgeon: Iran Planas, MD;  Location: [facility];  Service: Orthopedics;  Laterality: Left;    There were no vitals filed for this visit.      Subjective Assessment - 10/10/15 1323    Subjective Sore today, seems a little more stiff today.    Pertinent History DM2 insulin dependent, essential HTN, distal radius fracture with ORIF,    Currently in Pain? Yes   Pain Score 5    Pain Location Shoulder   Pain Orientation Left   Pain Descriptors / Indicators Sore  stiff                         [facility] Adult PT Treatment/Exercise - 10/10/15 0001    Shoulder Exercises: Supine   Flexion 20 reps   Flexion Limitations biat UE use with bar   Shoulder Exercises: Seated   Other Seated Exercises  scapular retraction x30   Shoulder Exercises: Pulleys   Flexion Other (comment)  5 min   Shoulder Exercises: ROM/Strengthening   UBE (Upper Arm Bike) 41min/3min L1   Other ROM/Strengthening Exercises wall slide ABCs x2   Modalities   Modalities Moist Heat   Moist Heat Therapy   Number Minutes Moist Heat 10 Minutes   Moist Heat Location Shoulder   Manual Therapy   Manual therapy comments median nerve glides; STM L upper trap   Joint Mobilization A-P Gr 2   Passive ROM all motions                PT Education - 10/10/15 1408    Education provided Yes   Education Details freezing to thawing with adhesive capsulitis. maintenance of ROM.    Person(s) Educated Patient   Methods Explanation;Tactile cues;Verbal cues   Comprehension Verbalized understanding;Verbal cues required;Tactile cues required             PT Long Term Goals - 10/05/15 1150    PT LONG TERM GOAL #1   Title Patient improves left shoulder AROM to within 75% of normal ROM for non-dominant shoulder. (Target Date: 11/03/2015)   Time 4   Period  Weeks   Status New   PT LONG TERM GOAL #2   Title Patient reports pain in left shoulder </= 3/10 with AROM.  (Target Date: 11/03/2015)   Time 4   Period Weeks   Status New   PT LONG TERM GOAL #3   Title Patient able to lift 10# tray simulating job tasks with proper mechanics independently.  (Target Date: 11/03/2015)   Time 4   Period Weeks   Status New   PT LONG TERM GOAL #4   Title Patient demonstrates understanding of ongoing HEP for left shoulder ROM & strength and upper body posture.  (Target Date: 11/03/2015)   Time 4   Period Weeks   Status New               Plan - 10/10/15 1408    Clinical Impression Statement Pain with ROM both actively and passively. Pt was educated regarding pain limitations and maintenance of ROM within tolerable limits. Positive for median nerve tension reduced with glides today,    PT Next Visit Plan continue with exercises  from 5/2      Patient will benefit from skilled therapeutic intervention in order to improve the following deficits and impairments:  Decreased activity tolerance, Decreased range of motion, Decreased strength, Increased muscle spasms, Impaired flexibility, Improper body mechanics, Postural dysfunction, Pain  Visit Diagnosis: Stiffness of left shoulder, not elsewhere classified  Pain in left shoulder  Muscle weakness (generalized)  Other symptoms and signs involving the musculoskeletal system     Problem List Patient Active Problem List   Diagnosis Date Noted  . Distal radius fracture, left 06/21/2015  . Hyperlipidemia 06/16/2014  . Essential hypertension 06/16/2014  . Diabetes mellitus type 2, insulin dependent ([place]) 03/24/2014    Alizaya Oshea C. Aarian Griffie PT, DPT 10/10/2015 2:12 PM   [facility] [address] [place], [place], [address] Phone: [phone]   Fax:  [phone]  Name: Brandy Burns MRN: [id number] Date of Birth: 06-14-1960

## 2015-10-18 ENCOUNTER — Encounter: Payer: Self-pay | Admitting: Physical Therapy

## 2015-10-18 ENCOUNTER — Ambulatory Visit: Payer: PRIVATE HEALTH INSURANCE | Admitting: Physical Therapy

## 2015-10-18 DIAGNOSIS — M6281 Muscle weakness (generalized): Secondary | ICD-10-CM

## 2015-10-18 DIAGNOSIS — R29898 Other symptoms and signs involving the musculoskeletal system: Secondary | ICD-10-CM

## 2015-10-18 DIAGNOSIS — M25512 Pain in left shoulder: Secondary | ICD-10-CM

## 2015-10-18 DIAGNOSIS — M25612 Stiffness of left shoulder, not elsewhere classified: Secondary | ICD-10-CM

## 2015-10-18 NOTE — Therapy (Signed)
[place], [place], [address] Phone: [phone]   Fax:  [phone]  Physical Therapy Treatment  Patient Details  Name: Brandy Burns MRN: [id number] Date of Birth: [birth year] Referring Provider: Iran Planas, MD  Encounter Date: 10/18/2015      PT End of Session - 10/18/15 0935    Visit Number 3   Number of Visits 13   Date for PT Re-Evaluation 11/16/15   Authorization Type Worker's Compensation   PT Start Time [phone]   PT Stop Time 1020   PT Time Calculation (min) 49 min   Activity Tolerance Patient limited by pain   Behavior During Therapy [facility] for tasks assessed/performed      Past Medical History  Diagnosis Date  . GERD (gastroesophageal reflux disease)   . Hyperlipemia   . Essential hypertension 06/16/2014  . Asthma   . Diabetes mellitus     type 2  . Kidney stones   . Anemia     as a child    Past Surgical History  Procedure Laterality Date  . Tonsillectomy    . Tubal ligation    . Ectopic pregnancy surgery    . Colonoscopy  09/23/2011    Procedure: COLONOSCOPY;  Burns: Juanita Craver, MD;  [facility];  Service: Endoscopy;  Laterality: N/A;  . Open reduction internal fixation (orif) distal radial fracture Left 06/21/2015    Procedure: OPEN REDUCTION INTERNAL FIXATION (ORIF) DISTAL RADIAL FRACTURE AND REPAIR AS NEEDED;  Burns: Iran Planas, MD;  Location: [place];  Service: Orthopedics;  Laterality: Left;    There were no vitals filed for this visit.      Subjective Assessment - 10/18/15 0933    Subjective Pt reports being sore today, felt pretty good after last session. did not take pain meds today.    Currently in Pain? Yes   Pain Score 4    Pain Location Shoulder   Pain Orientation Left   Pain Descriptors / Indicators --  sore   Pain Score 5  especially into thumb   Pain Location Wrist   Pain Orientation Left   Pain Descriptors / Indicators Aching                          [facility] Adult PT Treatment/Exercise - 10/18/15 0001    Shoulder Exercises: Supine   Flexion Other (comment)   Flexion Limitations 69min biat UE use with bar   Shoulder Exercises: Seated   Other Seated Exercises scapular retraction x30   Shoulder Exercises: Pulleys   Flexion Other (comment)  5 min   Shoulder Exercises: ROM/Strengthening   UBE (Upper Arm Bike) 18min/3min L1   Other ROM/Strengthening Exercises sleeper stretch 10x10 sec   Modalities   Modalities Moist Heat   Moist Heat Therapy   Number Minutes Moist Heat 10 Minutes   Moist Heat Location Shoulder   Manual Therapy   Manual therapy comments median nerve glides   Joint Mobilization A-P Gr 2, end range abduction inferior gr 2, end range flx post gr 2   Passive ROM all motions                PT Education - 10/18/15 1013    Education provided Yes   Education Details mechanism of pain, rationale for manual techniques, pulleys at home   Person(s) Educated Patient   Methods Explanation;Tactile cues;Verbal cues   Comprehension Verbalized understanding;Verbal cues required;Tactile cues required;Need further instruction  PT Long Term Goals - 10/05/15 1150    PT LONG TERM GOAL #1   Title Patient improves left shoulder AROM to within 75% of normal ROM for non-dominant shoulder. (Target Date: 11/03/2015)   Time 4   Period Weeks   Status New   PT LONG TERM GOAL #2   Title Patient reports pain in left shoulder </= 3/10 with AROM.  (Target Date: 11/03/2015)   Time 4   Period Weeks   Status New   PT LONG TERM GOAL #3   Title Patient able to lift 10# tray simulating job tasks with proper mechanics independently.  (Target Date: 11/03/2015)   Time 4   Period Weeks   Status New   PT LONG TERM GOAL #4   Title Patient demonstrates understanding of ongoing HEP for left shoulder ROM & strength and upper body posture.  (Target Date: 11/03/2015)   Time 4   Period Weeks    Status New               Plan - 10/18/15 1011    Clinical Impression Statement Pt reports aching pain at end range stretching and with joint mobilizations. Continues to feel pain all the way down to the tips of her fingers due to neural tension with adhesive capsulitis. Will continue to benefit from stretching and ROM exercises in a skilled manner to avoid excessive tightening of GHJ capsule.    PT Treatment/Interventions ADLs/Self Care Home Management;Electrical Stimulation;Cryotherapy;Moist Heat;Ultrasound;Iontophoresis 4mg /ml Dexamethasone;Therapeutic exercise;Neuromuscular re-education;Patient/family education;Manual techniques;Passive range of motion;Dry needling;Traction   PT Next Visit Plan continue with ROM activities within tolerable range    PT Home Exercise Plan wall slides   Consulted and Agree with Plan of Care Patient      Patient will benefit from skilled therapeutic intervention in order to improve the following deficits and impairments:  Decreased activity tolerance, Decreased range of motion, Decreased strength, Increased muscle spasms, Impaired flexibility, Improper body mechanics, Postural dysfunction, Pain  Visit Diagnosis: Stiffness of left shoulder, not elsewhere classified  Pain in left shoulder  Muscle weakness (generalized)  Other symptoms and signs involving the musculoskeletal system     Problem List Patient Active Problem List   Diagnosis Date Noted  . Distal radius fracture, left 06/21/2015  . Hyperlipidemia 06/16/2014  . Essential hypertension 06/16/2014  . Diabetes mellitus type 2, insulin dependent ([place]) 03/24/2014    Ferman Basilio C. Landon Truax PT, DPT 10/18/2015 10:15 AM  [place] [facility] [address] [place], [place], [address] Phone: [phone]   Fax:  [phone]  Name: Brandy Burns MRN: [id number] Date of Birth: 11-21-1960

## 2015-10-19 DIAGNOSIS — Z794 Long term (current) use of insulin: Secondary | ICD-10-CM | POA: Diagnosis not present

## 2015-10-19 DIAGNOSIS — H524 Presbyopia: Secondary | ICD-10-CM | POA: Diagnosis not present

## 2015-10-19 DIAGNOSIS — E119 Type 2 diabetes mellitus without complications: Secondary | ICD-10-CM | POA: Diagnosis not present

## 2015-10-20 ENCOUNTER — Ambulatory Visit: Payer: PRIVATE HEALTH INSURANCE | Admitting: Physical Therapy

## 2015-10-25 ENCOUNTER — Encounter: Payer: Self-pay | Admitting: Physical Therapy

## 2015-10-25 ENCOUNTER — Ambulatory Visit: Payer: PRIVATE HEALTH INSURANCE | Admitting: Physical Therapy

## 2015-10-25 DIAGNOSIS — M25612 Stiffness of left shoulder, not elsewhere classified: Secondary | ICD-10-CM

## 2015-10-25 DIAGNOSIS — M6281 Muscle weakness (generalized): Secondary | ICD-10-CM

## 2015-10-25 DIAGNOSIS — M25512 Pain in left shoulder: Secondary | ICD-10-CM

## 2015-10-25 NOTE — Therapy (Signed)
Brandy Burns, [place], [address] Phone: [phone]   Fax:  [phone]  Physical Therapy Treatment  Patient Details  Name: Brandy Burns MRN: [id number] Date of Birth: 04-21-1961 Referring Provider: Iran Planas, MD  Encounter Date: 10/25/2015      PT End of Session - 10/25/15 1002    Visit Number 4   Number of Visits 13   Date for PT Re-Evaluation 11/16/15   Authorization Type Worker's Compensation 8 visits approved   PT Start Time 1000   PT Stop Time 1059   PT Time Calculation (min) 59 min   Activity Tolerance Patient limited by pain   Behavior During Therapy [facility] for tasks assessed/performed      Past Medical History  Diagnosis Date  . GERD (gastroesophageal reflux disease)   . Hyperlipemia   . Essential hypertension 06/16/2014  . Asthma   . Diabetes mellitus     type 2  . Kidney stones   . Anemia     as a child    Past Surgical History  Procedure Laterality Date  . Tonsillectomy    . Tubal ligation    . Ectopic pregnancy surgery    . Colonoscopy  09/23/2011    Procedure: COLONOSCOPY;  Surgeon: Juanita Craver, MD;  [facility];  Service: Endoscopy;  Laterality: N/A;  . Open reduction internal fixation (orif) distal radial fracture Left 06/21/2015    Procedure: OPEN REDUCTION INTERNAL FIXATION (ORIF) DISTAL RADIAL FRACTURE AND REPAIR AS NEEDED;  Surgeon: Iran Planas, MD;  Location: [place];  Service: Orthopedics;  Laterality: Left;    There were no vitals filed for this visit.      Subjective Assessment - 10/25/15 1003    Subjective Pt reports soreness today that kept her up until about 2am.    Pertinent History DM2 insulin dependent, essential HTN, distal radius fracture with ORIF,    Currently in Pain? Yes   Pain Score 4    Pain Location Shoulder   Pain Orientation Left   Pain Descriptors / Indicators Sore            [facility] PT Assessment - 10/25/15 0001    Assessment    Next MD Visit 11/28/15   AROM   Left Shoulder Flexion 128 Degrees  significant shoulder elevation   Left Shoulder ABduction 102 Degrees   PROM   Left Shoulder Flexion 132 Degrees   Left Shoulder External Rotation 30 Degrees                     [facility] Adult PT Treatment/Exercise - 10/25/15 0001    Shoulder Exercises: Supine   Flexion Other (comment)   Flexion Limitations 59min bilat UE use with bar   Other Supine Exercises --   Other Supine Exercises supine ER stretch with bar 23min   Shoulder Exercises: Seated   Other Seated Exercises seated weight shift into LUE 3 min   Other Seated Exercises seated physioball roll out flexion & scaption alt 3 min   Shoulder Exercises: Prone   Other Prone Exercises child pose stretch  5x 10 sec   Shoulder Exercises: Standing   Other Standing Exercises AROM flexion and scaption in mirror x20 ea   Shoulder Exercises: Pulleys   Flexion Other (comment)  5 min   Shoulder Exercises: ROM/Strengthening   UBE (Upper Arm Bike) 5 min retro   Other ROM/Strengthening Exercises sleeper stretch 10x10 sec   Other ROM/Strengthening Exercises Neural glides at  wall   Moist Heat Therapy   Number Minutes Moist Heat 10 Minutes   Moist Heat Location Shoulder;Cervical                PT Education - 10/25/15 1005    Education provided Yes   Education Details exercise form/rationale, progression through therapy, importance of HEP   Person(s) Educated Patient   Methods Explanation;Demonstration;Tactile cues;Verbal cues   Comprehension Verbalized understanding;Returned demonstration;Verbal cues required;Tactile cues required;Need further instruction             PT Long Term Goals - 10/05/15 1150    PT LONG TERM GOAL #1   Title Patient improves left shoulder AROM to within 75% of normal ROM for non-dominant shoulder. (Target Date: 11/03/2015)   Time 4   Period Weeks   Status New   PT LONG TERM GOAL #2   Title Patient reports pain in  left shoulder </= 3/10 with AROM.  (Target Date: 11/03/2015)   Time 4   Period Weeks   Status New   PT LONG TERM GOAL #3   Title Patient able to lift 10# tray simulating job tasks with proper mechanics independently.  (Target Date: 11/03/2015)   Time 4   Period Weeks   Status New   PT LONG TERM GOAL #4   Title Patient demonstrates understanding of ongoing HEP for left shoulder ROM & strength and upper body posture.  (Target Date: 11/03/2015)   Time 4   Period Weeks   Status New               Plan - 10/25/15 1049    Clinical Impression Statement Worked with AROM in mirror today at the end of treatment and pt demo decreased motion due to fatigue, esp when cued to avoid use of UT. Will continue to benefit from skilled PT to improve GHJ ROM,strength and endurance to avoid painful compensatory patterns.    PT Treatment/Interventions ADLs/Self Care Home Management;Electrical Stimulation;Cryotherapy;Moist Heat;Ultrasound;Iontophoresis 4mg /ml Dexamethasone;Therapeutic exercise;Neuromuscular re-education;Patient/family education;Manual techniques;Passive range of motion;Dry needling;Traction   PT Next Visit Plan continue with ROM activities within tolerable range    PT Home Exercise Plan wall slides, AROM flx and scaption in mirror   Consulted and Agree with Plan of Care Patient      Patient will benefit from skilled therapeutic intervention in order to improve the following deficits and impairments:  Decreased activity tolerance, Decreased range of motion, Decreased strength, Increased muscle spasms, Impaired flexibility, Improper body mechanics, Postural dysfunction, Pain  Visit Diagnosis: Stiffness of left shoulder, not elsewhere classified  Pain in left shoulder  Muscle weakness (generalized)     Problem List Patient Active Problem List   Diagnosis Date Noted  . Distal radius fracture, left 06/21/2015  . Hyperlipidemia 06/16/2014  . Essential hypertension 06/16/2014  .  Diabetes mellitus type 2, insulin dependent ([place]) 03/24/2014   Izzabelle Bouley C. Felice Hope PT, DPT 10/25/2015 10:52 AM   [place] [facility] [address] [place], [place], [address] Phone: [phone]   Fax:  [phone]  Name: Brandy Burns MRN: [id number] Date of Birth: July 13, 1960

## 2015-10-26 ENCOUNTER — Other Ambulatory Visit: Payer: Self-pay | Admitting: *Deleted

## 2015-10-27 ENCOUNTER — Ambulatory Visit: Payer: PRIVATE HEALTH INSURANCE | Admitting: Physical Therapy

## 2015-10-27 ENCOUNTER — Other Ambulatory Visit: Payer: Self-pay | Admitting: *Deleted

## 2015-10-27 DIAGNOSIS — M25612 Stiffness of left shoulder, not elsewhere classified: Secondary | ICD-10-CM

## 2015-10-27 DIAGNOSIS — R29898 Other symptoms and signs involving the musculoskeletal system: Secondary | ICD-10-CM

## 2015-10-27 DIAGNOSIS — M25512 Pain in left shoulder: Secondary | ICD-10-CM

## 2015-10-27 DIAGNOSIS — M6281 Muscle weakness (generalized): Secondary | ICD-10-CM

## 2015-10-27 MED FILL — UNIFINE PENTIPS 32GX5/32: 32G X 4 MM | 50 days supply | Qty: 100 | Fill #0

## 2015-10-27 NOTE — Patient Outreach (Signed)
Message left on Falen's mobile phone with smoking cessation information related to copay for Wellbutrin and benefits provided at no cost by [facility] including the [place] program and individual counseling for tobacco cessation at all [facility] pharmacies and the [place] line (1-800-QuitNow). Also offered to mail written information or send via interoffice mail based on Kendrea's preference.  Barrington Ellison RN,CCM,CDE [place] Management Coordinator Link To Wellness Office Phone [phone] Office Fax [phone]

## 2015-10-27 NOTE — Therapy (Signed)
[place], [place], [address] Phone: [phone]   Fax:  [phone]  Physical Therapy Treatment  Patient Details  Name: Brandy Burns MRN: [id number] Date of Birth: Sep 19, 1960 Referring Provider: Iran Planas, MD  Encounter Date: 10/27/2015      PT End of Session - 10/27/15 0931    Visit Number 5   Number of Visits 13   Date for PT Re-Evaluation 11/16/15   Authorization Type Worker's Compensation 8 visits approved   PT Start Time [phone]   PT Stop Time 1025   PT Time Calculation (min) 60 min      Past Medical History  Diagnosis Date  . GERD (gastroesophageal reflux disease)   . Hyperlipemia   . Essential hypertension 06/16/2014  . Asthma   . Diabetes mellitus     type 2  . Kidney stones   . Anemia     as a child    Past Surgical History  Procedure Laterality Date  . Tonsillectomy    . Tubal ligation    . Ectopic pregnancy surgery    . Colonoscopy  09/23/2011    Procedure: COLONOSCOPY;  Surgeon: Juanita Craver, MD;  [facility];  Service: Endoscopy;  Laterality: N/A;  . Open reduction internal fixation (orif) distal radial fracture Left 06/21/2015    Procedure: OPEN REDUCTION INTERNAL FIXATION (ORIF) DISTAL RADIAL FRACTURE AND REPAIR AS NEEDED;  Surgeon: Iran Planas, MD;  Location: [place];  Service: Orthopedics;  Laterality: Left;    There were no vitals filed for this visit.      Subjective Assessment - 10/27/15 0930    Currently in Pain? Yes   Pain Score 4    Pain Location Shoulder   Pain Orientation Left   Pain Descriptors / Indicators Sore   Aggravating Factors  moving arm   Pain Relieving Factors leaving it still                         [facility] Adult PT Treatment/Exercise - 10/27/15 0001    Shoulder Exercises: Supine   Other Supine Exercises Supine chest press and pullovers with wooden dowel after manual - pt reports less arm pain.    Shoulder  Exercises: Standing   Other Standing Exercises UE ranger used to decrease shoulder height with flexion to shoulder height with UE ranger on floor then advanced to UE on level 12 for overhead reaching AAROM - cues for decreasing shoulder hike- good carryover   Shoulder Exercises: Pulleys   Flexion 2 minutes   Flexion Limitations with scapular guidance   Shoulder Exercises: ROM/Strengthening   UBE (Upper Arm Bike) 5 min retro   Moist Heat Therapy   Number Minutes Moist Heat 15 Minutes   Moist Heat Location Shoulder   Manual Therapy   Manual Therapy Soft tissue mobilization   Joint Mobilization A-P Gr 2 GH jt and inferior glides with distraction painful and send pain shooting down arm, scapular mobs in sidelying   Soft tissue mobilization IASTM used to decrease fascial restrictions along bicep and deltoids-less pain aith AROM after manual                     PT Long Term Goals - 10/05/15 1150    PT LONG TERM GOAL #1   Title Patient improves left shoulder AROM to within 75% of normal ROM for non-dominant shoulder. (Target Date: 11/03/2015)   Time 4   Period Weeks  Status New   PT LONG TERM GOAL #2   Title Patient reports pain in left shoulder </= 3/10 with AROM.  (Target Date: 11/03/2015)   Time 4   Period Weeks   Status New   PT LONG TERM GOAL #3   Title Patient able to lift 10# tray simulating job tasks with proper mechanics independently.  (Target Date: 11/03/2015)   Time 4   Period Weeks   Status New   PT LONG TERM GOAL #4   Title Patient demonstrates understanding of ongoing HEP for left shoulder ROM & strength and upper body posture.  (Target Date: 11/03/2015)   Time 4   Period Weeks   Status New               Plan - 10/27/15 1020    Clinical Impression Statement Soft tissue mobilization and myofascial realease to upper left arm to tolerance. Pt reports large knot in muscle that radiates pain down arm. Pt reports less pain with AROM after manual. Used UR  ranger to decrease shoulder hike with AAROM. [place] post for muscle soreness and relaxation   PT Next Visit Plan continue with ROM activities within tolerable range , manual to upper arm      Patient will benefit from skilled therapeutic intervention in order to improve the following deficits and impairments:  Decreased activity tolerance, Decreased range of motion, Decreased strength, Increased muscle spasms, Impaired flexibility, Improper body mechanics, Postural dysfunction, Pain  Visit Diagnosis: Stiffness of left shoulder, not elsewhere classified  Pain in left shoulder  Muscle weakness (generalized)  Other symptoms and signs involving the musculoskeletal system     Problem List Patient Active Problem List   Diagnosis Date Noted  . Distal radius fracture, left 06/21/2015  . Hyperlipidemia 06/16/2014  . Essential hypertension 06/16/2014  . Diabetes mellitus type 2, insulin dependent ([place]) 03/24/2014    [name], [place] 10/27/2015, 10:23 AM  [facility] [address] [place], [place], [address] Phone: [phone]   Fax:  [phone]  Name: Brandy Burns MRN: [id number] Date of Birth: August 19, 1960

## 2015-10-27 NOTE — Patient Outreach (Signed)
Brandy Burns) Care Management   10/27/2015  Brandy Burns 1960/07/04 [id number]  Brandy Burns is an 55 y.o. female who presents to the [place] Management office for routine Link To Wellness follow up for self management assistance with Type II DM and hyperlipidemia.  Subjective:  Brandy Burns says she is now struggling with left shoulder pain related to a diagnosis of adhesive capsulitis and is receiving OP physical therapy twice weekly. 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  Brandy Burns says she saw her primary care provider In April and her Hgb A1C had increased a bit to 7.4% but no changes were made to her medications. She reports a fasting variance of 91-136 and states she does not check post prandial CBGs although she does take her Humalog insulin with supper since that is her biggest meal. She says she is not formally exercising but takes the steps at work instead of the elevator and purposefully  takes the long way when walking in the hospital to get extra steps in. Brandy Burns says she wants to stop smoking by trying Wellbutrin and is asking for this RNCM's direction in starting the process.  Objective:   Review of Systems  Constitutional: Negative.     Physical Exam  Constitutional: She is oriented to person, place, and time. She appears well-developed and well-nourished.  Respiratory: Effort normal.  Neurological: She is alert and oriented to person, place, and time.  Skin: Skin is warm and dry.  Psychiatric: She has a normal mood and affect. Her behavior is normal. Judgment and thought content normal.   Filed Weights   10/26/15 1534  Weight: 204 lb 6.4 oz (92.715 kg)   Filed Vitals:   10/26/15 1534  BP:  115/70    Encounter Medications:   Outpatient Encounter Prescriptions as of 10/26/2015  Medication Sig Note  . aspirin 81 MG tablet Take 81 mg by mouth daily.   . insulin aspart (NOVOLOG FLEXPEN) 100 UNIT/ML FlexPen Inject 10 Units into the skin every evening. 10/26/2015: Takes with dinner  . insulin glargine (LANTUS) 100 UNIT/ML injection Inject 34 Units into the skin daily.    Marland Kitchen lisinopril (PRINIVIL,ZESTRIL) 10 MG tablet Take 10 mg by mouth daily.   . metFORMIN (GLUCOPHAGE) 1000 MG tablet Take 1,000 mg by mouth 2 (two) times daily with a meal.    . pantoprazole (PROTONIX) 40 MG tablet Take 40 mg by mouth daily.    . simvastatin (ZOCOR) 40 MG tablet Take 40 mg by mouth every evening.   . sitaGLIPtin (JANUVIA) 100 MG tablet Take 100 mg by mouth daily.   . traMADol (ULTRAM) 50 MG tablet Take 1 tablet (50 mg total) by mouth every 6 (six) hours as needed.    No facility-administered encounter medications on file as of 10/26/2015.    Functional Status:   In your present state of health, do you have any difficulty performing the following activities: 10/26/2015 07/20/2015  Hearing? N N  Vision? N N  Difficulty concentrating or making decisions? N N  Walking or climbing stairs? N N  Dressing or bathing? Y N  Doing errands, shopping? 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 with type II DM, hyperlipidemia and obesity meeting treatment target for lipid control, recent Hgb A1C not meeting target Hgb A1C ,7.0% as evidenced by Hgb A1C= 7.4%   Plan:  [facility] CM Care Plan Problem One        Most Recent Value   Care Plan Problem One  Type II DM, obesity hyperlipidemia with Hgb A1C= 7.4% on 09/27/15, lipid panel normal on 09/27/15, BMI=35.07, patient  wishes to stop smoking and is asking for assistance   Role Documenting the Problem One  Care Management Coordinator   Care Plan for Problem  One  Active   [facility] Long Term Goal (31-90 days)  Brandy Burns will report she has taking steps toward smoking cessation and either reduce the number of cigarettes she is  smoking or stop completely, improved glycemic control as evidenced by Hgb A1C <7.0%, improved lipid profile, and no weight gain at next assessment    [facility] Long Term Goal Start Date  07/20/15   Interventions for Problem One Long Term Goal  discussed changes to health history to include recent diagnosis of left shoulder capsulitis and OP PT twice weekly, assessed pain and pain management strategies,  reviewed medications and assessed medication, discussed mechanism of action, onset, peak and duration, and common side effects of novolog and lantus insulin, reviewed Metformin's mechanism of action and common side effects, reviewed lab results of 09/27/15 and discussed medication options such as SGLT2 inhibitors including mechanism of action , dosing and frequency and common side effects, to allow for possible reduced insulin needs and improved glycemic control, reviewed frequency of blood sugar monitoring and blood sugar log history and reviewed pre and post meal blood sugar targets, encouraged Brandy Burns to check 1-2 hours post supper to determine efficacy of current dose of humalog, reviewed 24 hour dietary intake and estimated CHO intake, reviewed plate method and portion control, reviewed effect of exercise on insulin resistance and congratulated her on taking steps and increasing walking distances while at work,  [employer] on her desire to stop smoking and discussed free benefits offered by [employer] medical plan to cover smoking cessation and encouraged Brandy Burns to call for telephone, on line or pharmacy  counseling in addition to pharmaceutical therapy, will send in basket message to [place] for her assist, discussed health maintenance and encouraged her to ask about bone density test at her next appointment with her primary care provider,  arranged for Link To Wellness follow up in May        RNCM to fax today's office visit note to [employer]. RNCM will meet quarterly and as needed with patient per Link To Wellness program guidelines to assist with Type II DM and hyperlipidemia and obesity self-management and assess patient's progress toward mutually set goals. Barrington Ellison RN,CCM,CDE [place] Management Coordinator Link To Wellness Office Phone [phone] Office Fax [phone]

## 2015-11-01 ENCOUNTER — Encounter: Payer: Self-pay | Admitting: Physical Therapy

## 2015-11-01 ENCOUNTER — Ambulatory Visit: Payer: PRIVATE HEALTH INSURANCE | Admitting: Physical Therapy

## 2015-11-01 DIAGNOSIS — M25512 Pain in left shoulder: Secondary | ICD-10-CM

## 2015-11-01 DIAGNOSIS — M6281 Muscle weakness (generalized): Secondary | ICD-10-CM

## 2015-11-01 DIAGNOSIS — M25612 Stiffness of left shoulder, not elsewhere classified: Secondary | ICD-10-CM

## 2015-11-01 NOTE — Therapy (Signed)
[place], [place], [address] Phone: [phone]   Fax:  [phone]  Physical Therapy Treatment  Patient Details  Name: Brandy Burns MRN: [id number] Date of Birth: September 11, 1960 Referring Provider: Iran Planas, MD  Encounter Date: 11/01/2015      PT End of Session - 11/01/15 0938    Visit Number 6   Number of Visits 13   Date for PT Re-Evaluation 11/16/15   Authorization Type Worker's Compensation 8 visits approved   PT Start Time [phone]   PT Stop Time 1028   PT Time Calculation (min) 50 min   Activity Tolerance Patient limited by pain   Behavior During Therapy [facility] for tasks assessed/performed      Past Medical History  Diagnosis Date  . GERD (gastroesophageal reflux disease)   . Hyperlipemia   . Essential hypertension 06/16/2014  . Asthma   . Diabetes mellitus     type 2  . Kidney stones   . Anemia     as a child    Past Surgical History  Procedure Laterality Date  . Tonsillectomy    . Tubal ligation    . Ectopic pregnancy surgery    . Colonoscopy  09/23/2011    Procedure: COLONOSCOPY;  Surgeon: Juanita Craver, MD;  [facility];  Service: Endoscopy;  Laterality: N/A;  . Open reduction internal fixation (orif) distal radial fracture Left 06/21/2015    Procedure: OPEN REDUCTION INTERNAL FIXATION (ORIF) DISTAL RADIAL FRACTURE AND REPAIR AS NEEDED;  Surgeon: Iran Planas, MD;  Location: [place];  Service: Orthopedics;  Laterality: Left;    There were no vitals filed for this visit.      Subjective Assessment - 11/01/15 0938    Subjective pulled hand from cat quickly this weekend resulted in increased pain. Shoulder wrist and hand are extra sore today most likely due to weather.    Currently in Pain? Yes   Pain Score 5    Pain Location Shoulder   Pain Orientation Left   Pain Descriptors / Indicators Aching;Sore                         [facility] Adult PT  Treatment/Exercise - 11/01/15 0001    Shoulder Exercises: Seated   Other Seated Exercises seated PB roll out flx x30   Shoulder Exercises: Sidelying   Other Sidelying Exercises resisted scapular retraction   Shoulder Exercises: Standing   Flexion 20 reps   Flexion Limitations manually assisted scapular movement   Other Standing Exercises HBB with strap   Other Standing Exercises UE ranger used to decrease shoulder height with flexion to shoulder height with UE ranger on floor then advanced to UE on level 12 for overhead reaching AAROM - cues for decreasing shoulder hike- good carryover  2 min floor flx, 2 min HBB, 2 min OH flx L12   Shoulder Exercises: ROM/Strengthening   UBE (Upper Arm Bike) 5 min retro   Wrist Exercises   Other wrist exercises prayer stretch   Moist Heat Therapy   Number Minutes Moist Heat 10 Minutes  4x2 min contrast heat/cold pack, 20s ea; 10 min GHJ end    Moist Heat Location Shoulder                PT Education - 11/01/15 1025    Education provided Yes   Education Details importance of avoiding shoulder hike, movement in mirror and contrast baths at home   Person(s)  Educated Patient   Methods Explanation;Demonstration;Tactile cues;Verbal cues   Comprehension Verbalized understanding;Returned demonstration;Verbal cues required;Tactile cues required;Need further instruction             PT Long Term Goals - 10/05/15 1150    PT LONG TERM GOAL #1   Title Patient improves left shoulder AROM to within 75% of normal ROM for non-dominant shoulder. (Target Date: 11/03/2015)   Time 4   Period Weeks   Status New   PT LONG TERM GOAL #2   Title Patient reports pain in left shoulder </= 3/10 with AROM.  (Target Date: 11/03/2015)   Time 4   Period Weeks   Status New   PT LONG TERM GOAL #3   Title Patient able to lift 10# tray simulating job tasks with proper mechanics independently.  (Target Date: 11/03/2015)   Time 4   Period Weeks   Status New   PT  LONG TERM GOAL #4   Title Patient demonstrates understanding of ongoing HEP for left shoulder ROM & strength and upper body posture.  (Target Date: 11/03/2015)   Time 4   Period Weeks   Status New               Plan - 11/01/15 1026    Clinical Impression Statement Pt cont to demo shoulder hike in active [place] motion against gravity that is reduced with assistnace to scapula. stretching and contrast bath for wrist/hand today to decrease pain and improve ROM. Pt will cont to benefit from skilled PT in order to assist in training ROM through thawing phase of adhesive capsulitis to return to PLOF.    PT Treatment/Interventions ADLs/Self Care Home Management;Electrical Stimulation;Cryotherapy;Moist Heat;Ultrasound;Iontophoresis 4mg /ml Dexamethasone;Therapeutic exercise;Neuromuscular re-education;Patient/family education;Manual techniques;Passive range of motion;Dry needling;Traction   PT Next Visit Plan continue with ROM activities within tolerable range , manual to upper arm   PT Home Exercise Plan wall slides, AROM flx and scaption in mirror, contrast bath for wrist, HBB stretch with strap   Consulted and Agree with Plan of Care Patient      Patient will benefit from skilled therapeutic intervention in order to improve the following deficits and impairments:  Decreased activity tolerance, Decreased range of motion, Decreased strength, Increased muscle spasms, Impaired flexibility, Improper body mechanics, Postural dysfunction, Pain  Visit Diagnosis: Stiffness of left shoulder, not elsewhere classified  Pain in left shoulder  Muscle weakness (generalized)     Problem List Patient Active Problem List   Diagnosis Date Noted  . Distal radius fracture, left 06/21/2015  . Hyperlipidemia 06/16/2014  . Essential hypertension 06/16/2014  . Diabetes mellitus type 2, insulin dependent ([place]) 03/24/2014    Brice Potteiger C. Morissa Obeirne PT, DPT 11/01/2015 12:56 PM   [facility] [address] [place], [place], [address] Phone: [phone]   Fax:  [phone]  Name: Brandy Burns MRN: [id number] Date of Birth: 07-Feb-1961

## 2015-11-03 ENCOUNTER — Ambulatory Visit: Payer: PRIVATE HEALTH INSURANCE | Admitting: Physical Therapy

## 2015-11-03 ENCOUNTER — Encounter: Payer: Self-pay | Admitting: Physical Therapy

## 2015-11-03 DIAGNOSIS — M25612 Stiffness of left shoulder, not elsewhere classified: Secondary | ICD-10-CM

## 2015-11-03 DIAGNOSIS — M25512 Pain in left shoulder: Secondary | ICD-10-CM

## 2015-11-03 DIAGNOSIS — M6281 Muscle weakness (generalized): Secondary | ICD-10-CM

## 2015-11-03 NOTE — Therapy (Signed)
[place], [place], [address] Phone: [phone]   Fax:  [phone]  Physical Therapy Treatment  Patient Details  Name: Brandy Burns MRN: [id number] Date of Birth: May 06, 1961 Referring Provider: Iran Planas, MD  Encounter Date: 11/03/2015      PT End of Session - 11/03/15 0943    Visit Number 7   Number of Visits 13   Date for PT Re-Evaluation 11/16/15   Authorization Type Worker's Compensation 8 visits approved   PT Start Time 0940   PT Stop Time 1027   PT Time Calculation (min) 47 min      Past Medical History  Diagnosis Date  . GERD (gastroesophageal reflux disease)   . Hyperlipemia   . Essential hypertension 06/16/2014  . Asthma   . Diabetes mellitus     type 2  . Kidney stones   . Anemia     as a child    Past Surgical History  Procedure Laterality Date  . Tonsillectomy    . Tubal ligation    . Ectopic pregnancy surgery    . Colonoscopy  09/23/2011    Procedure: COLONOSCOPY;  Surgeon: Juanita Craver, MD;  [facility];  Service: Endoscopy;  Laterality: N/A;  . Open reduction internal fixation (orif) distal radial fracture Left 06/21/2015    Procedure: OPEN REDUCTION INTERNAL FIXATION (ORIF) DISTAL RADIAL FRACTURE AND REPAIR AS NEEDED;  Surgeon: Iran Planas, MD;  Location: [place];  Service: Orthopedics;  Laterality: Left;    There were no vitals filed for this visit.      Subjective Assessment - 11/03/15 0944    Subjective pt reports feeling very stiff today, no longer taking pain meds   Currently in Pain? Yes   Pain Score 5    Pain Location Shoulder   Pain Orientation Left   Pain Descriptors / Indicators --  stiff   Pain Type Acute pain   Aggravating Factors  reaching, lifting   Pain Relieving Factors rest            [facility] PT Assessment - 11/03/15 0001    PROM   Left Shoulder Flexion 115 Degrees  without pain at pulleys                      [facility] Adult PT Treatment/Exercise - 11/03/15 0001    Shoulder Exercises: Seated   Flexion Limitations foam roller stretch on mat.    ABduction Limitations foam roller stretch   Shoulder Exercises: Pulleys   Flexion 3 minutes   Shoulder Exercises: ROM/Strengthening   UBE (Upper Arm Bike) 3 min retro   Moist Heat Therapy   Number Minutes Moist Heat 10 Minutes   Moist Heat Location Shoulder  along deltoid and biceps   Manual Therapy   Soft tissue mobilization IASTM used to decrease fascial restrictions along bicep and deltoids-less pain aith AROM after manual                PT Education - 11/03/15 0949    Education provided Yes   Education Details exercise form/rationale, avoidance of shoulder hike   Person(s) Educated Patient   Methods Explanation;Demonstration;Tactile cues;Verbal cues   Comprehension Verbalized understanding;Returned demonstration;Verbal cues required;Tactile cues required             PT Long Term Goals - 10/05/15 1150    PT LONG TERM GOAL #1   Title Patient improves left shoulder AROM to within 75% of normal ROM for  non-dominant shoulder. (Target Date: 11/03/2015)   Time 4   Period Weeks   Status New   PT LONG TERM GOAL #2   Title Patient reports pain in left shoulder </= 3/10 with AROM.  (Target Date: 11/03/2015)   Time 4   Period Weeks   Status New   PT LONG TERM GOAL #3   Title Patient able to lift 10# tray simulating job tasks with proper mechanics independently.  (Target Date: 11/03/2015)   Time 4   Period Weeks   Status New   PT LONG TERM GOAL #4   Title Patient demonstrates understanding of ongoing HEP for left shoulder ROM & strength and upper body posture.  (Target Date: 11/03/2015)   Time 4   Period Weeks   Status New               Plan - 11/03/15 1211    Clinical Impression Statement Pt experiencing severe pain along lateral arm with concordant pain at trigger points. After some stretching, treatment focused on decreased  myofascial irritation which pt reported decreased pain.    PT Treatment/Interventions ADLs/Self Care Home Management;Electrical Stimulation;Cryotherapy;Moist Heat;Ultrasound;Iontophoresis 4mg /ml Dexamethasone;Therapeutic exercise;Neuromuscular re-education;Patient/family education;Manual techniques;Passive range of motion;Dry needling;Traction   PT Next Visit Plan continue with ROM activities within tolerable range , manual to upper arm; goal update & recert   PT Home Exercise Plan wall slides, AROM flx and scaption in mirror, contrast bath for wrist, HBB stretch with strap   Consulted and Agree with Plan of Care Patient      Patient will benefit from skilled therapeutic intervention in order to improve the following deficits and impairments:  Decreased activity tolerance, Decreased range of motion, Decreased strength, Increased muscle spasms, Impaired flexibility, Improper body mechanics, Postural dysfunction, Pain  Visit Diagnosis: Stiffness of left shoulder, not elsewhere classified  Pain in left shoulder  Muscle weakness (generalized)     Problem List Patient Active Problem List   Diagnosis Date Noted  . Distal radius fracture, left 06/21/2015  . Hyperlipidemia 06/16/2014  . Essential hypertension 06/16/2014  . Diabetes mellitus type 2, insulin dependent ([place]) 03/24/2014    Ajdin Macke C. Pariss Hommes PT, DPT 11/03/2015 12:17 PM   [place], [place], [address] Phone: [phone]   Fax:  [phone]  Name: Brandy Burns MRN: [id number] Date of Birth: [birth year]

## 2015-11-07 DIAGNOSIS — Z6834 Body mass index (BMI) 34.0-34.9, adult: Secondary | ICD-10-CM | POA: Diagnosis not present

## 2015-11-07 DIAGNOSIS — Z01419 Encounter for gynecological examination (general) (routine) without abnormal findings: Secondary | ICD-10-CM | POA: Diagnosis not present

## 2015-11-08 ENCOUNTER — Encounter: Payer: Self-pay | Admitting: Physical Therapy

## 2015-11-08 ENCOUNTER — Ambulatory Visit: Payer: PRIVATE HEALTH INSURANCE | Admitting: Physical Therapy

## 2015-11-08 DIAGNOSIS — M25512 Pain in left shoulder: Secondary | ICD-10-CM

## 2015-11-08 DIAGNOSIS — M79622 Pain in left upper arm: Secondary | ICD-10-CM

## 2015-11-08 DIAGNOSIS — M6281 Muscle weakness (generalized): Secondary | ICD-10-CM

## 2015-11-08 DIAGNOSIS — M25612 Stiffness of left shoulder, not elsewhere classified: Secondary | ICD-10-CM | POA: Diagnosis not present

## 2015-11-08 NOTE — Therapy (Signed)
[place], [place], [address] Phone: [phone]   Fax:  [phone]  Physical Therapy Treatment  Patient Details  Name: CECI RAVENS MRN: [id number] Date of Birth: July 03, 1960 Referring Provider: Iran Planas, MD  Encounter Date: 11/08/2015      PT End of Session - 11/08/15 1014    Visit Number 8   Number of Visits 13   Date for PT Re-Evaluation 11/16/15   Authorization Type Worker's Compensation 8 visits approved   PT Start Time 0932   PT Stop Time 1012   PT Time Calculation (min) 40 min   Activity Tolerance Patient limited by pain   Behavior During Therapy [facility] for tasks assessed/performed      Past Medical History  Diagnosis Date  . GERD (gastroesophageal reflux disease)   . Hyperlipemia   . Essential hypertension 06/16/2014  . Asthma   . Diabetes mellitus     type 2  . Kidney stones   . Anemia     as a child    Past Surgical History  Procedure Laterality Date  . Tonsillectomy    . Tubal ligation    . Ectopic pregnancy surgery    . Colonoscopy  09/23/2011    Procedure: COLONOSCOPY;  Surgeon: Juanita Craver, MD;  [facility];  Service: Endoscopy;  Laterality: N/A;  . Open reduction internal fixation (orif) distal radial fracture Left 06/21/2015    Procedure: OPEN REDUCTION INTERNAL FIXATION (ORIF) DISTAL RADIAL FRACTURE AND REPAIR AS NEEDED;  Surgeon: Iran Planas, MD;  Location: [place];  Service: Orthopedics;  Laterality: Left;    There were no vitals filed for this visit.      Subjective Assessment - 11/08/15 0935    Subjective reports pain kept her awake most of the night. is doing contrast baths for wrist/hand which helps but it still feels very stiff in the mornings. arm was sore following soft tissue work last visit.    Pertinent History DM2 insulin dependent, essential HTN, distal radius fracture with ORIF,    Patient Stated Goals She would like her left arm to get as  close to normal as possible.    Currently in Pain? Yes   Pain Score 5    Pain Location Shoulder   Pain Orientation Left   Pain Descriptors / Indicators Aching  stiff   Pain Type Acute pain   Aggravating Factors  reaching, using arm   Pain Relieving Factors rest, modalities            [facility] PT Assessment - 11/08/15 0001    PROM   Left Shoulder Flexion 120 Degrees   Left Shoulder ABduction 85 Degrees   Left Shoulder External Rotation 38 Degrees  at 45 deg abd                     [facility] Adult PT Treatment/Exercise - 11/08/15 0001    Shoulder Exercises: Supine   External Rotation Other (comment)  3 min with bar   Flexion Limitations 26min bilat UE use with bar   Shoulder Exercises: Standing   Extension Limitations HBB with towel 3 min   Retraction 15 reps   Other Standing Exercises pendulums  abd, flx   Shoulder Exercises: Pulleys   Flexion 3 minutes   Shoulder Exercises: ROM/Strengthening   Other ROM/Strengthening Exercises cross body stretch 2x30s   Shoulder Exercises: Isometric Strengthening   Extension Limitations 10x5s   Manual Therapy   Joint Mobilization flexion stretching,  end range inf glides; abduction stretching, end range inf glides                PT Education - 11/08/15 1013    Education provided Yes   Education Details exercise form/rationale, use of TENS, HEP   Person(s) Educated Patient   Methods Explanation;Demonstration;Tactile cues;Verbal cues   Comprehension Verbalized understanding;Returned demonstration;Verbal cues required;Tactile cues required;Need further instruction             PT Long Term Goals - 11/08/15 1125    PT LONG TERM GOAL #1   Title Patient improves left shoulder AROM to within 75% of normal ROM for non-dominant shoulder. (Target Date: 12/13/2015)   Baseline see objective measures   Time 5   Period Weeks   Status On-going   PT LONG TERM GOAL #2   Title Patient reports pain in left shoulder </= 3/10  with AROM.     Baseline 5/10 on average with increases during AROM   Time 5   Period Weeks   Status On-going   PT LONG TERM GOAL #3   Title Patient able to lift 10# tray simulating job tasks with proper mechanics independently.     Baseline unable due to pain   Time 5   Period Weeks   Status On-going   PT LONG TERM GOAL #4   Title Patient demonstrates understanding of ongoing HEP for left shoulder ROM & strength and upper body posture.     Baseline ongoing as HEP is progressed   Time 5   Period Weeks   Status On-going               Plan - 11/08/15 1014    Clinical Impression Statement continues to have pain but is demonstrating improvements in ROM and was able to use LUE below shoulder height for assist to place hair in clip. Will continue to benefit from skilled PT to return strength and ROM to functional levels.    PT Treatment/Interventions ADLs/Self Care Home Management;Electrical Stimulation;Cryotherapy;Moist Heat;Ultrasound;Iontophoresis 4mg /ml Dexamethasone;Therapeutic exercise;Neuromuscular re-education;Patient/family education;Manual techniques;Passive range of motion;Dry needling;Traction      Patient will benefit from skilled therapeutic intervention in order to improve the following deficits and impairments:  Decreased activity tolerance, Decreased range of motion, Decreased strength, Increased muscle spasms, Impaired flexibility, Improper body mechanics, Postural dysfunction, Pain  Visit Diagnosis: Pain in left shoulder - Plan: PT plan of care cert/re-cert  Muscle weakness (generalized) - Plan: PT plan of care cert/re-cert  Pain in left upper arm - Plan: PT plan of care cert/re-cert     Problem List Patient Active Problem List   Diagnosis Date Noted  . Distal radius fracture, left 06/21/2015  . Hyperlipidemia 06/16/2014  . Essential hypertension 06/16/2014  . Diabetes mellitus type 2, insulin dependent ([place]) 03/24/2014   Lamisha Roussell C. Evelise Reine PT,  DPT 11/08/2015 11:30 AM   [place] [facility] [address] [place], [place], [address] Phone: [phone]   Fax:  [phone]  Name: Brandy Burns MRN: [id number] Date of Birth: 1961-02-08

## 2015-11-10 ENCOUNTER — Ambulatory Visit: Payer: PRIVATE HEALTH INSURANCE | Attending: Orthopedic Surgery

## 2015-11-10 DIAGNOSIS — M25632 Stiffness of left wrist, not elsewhere classified: Secondary | ICD-10-CM | POA: Insufficient documentation

## 2015-11-10 DIAGNOSIS — M79622 Pain in left upper arm: Secondary | ICD-10-CM | POA: Insufficient documentation

## 2015-11-10 DIAGNOSIS — M6281 Muscle weakness (generalized): Secondary | ICD-10-CM | POA: Insufficient documentation

## 2015-11-10 DIAGNOSIS — M25532 Pain in left wrist: Secondary | ICD-10-CM | POA: Insufficient documentation

## 2015-11-10 DIAGNOSIS — M25512 Pain in left shoulder: Secondary | ICD-10-CM | POA: Insufficient documentation

## 2015-11-10 DIAGNOSIS — M25612 Stiffness of left shoulder, not elsewhere classified: Secondary | ICD-10-CM | POA: Diagnosis present

## 2015-11-10 NOTE — Therapy (Signed)
[place], [place], [address] Phone: [phone]   Fax:  [phone]  Physical Therapy Treatment  Patient Details  Name: Brandy Burns MRN: [id number] Date of Birth: March 24, 1961 Referring Provider: Iran Planas, MD  Encounter Date: 11/10/2015      PT End of Session - 11/10/15 0939    Visit Number 9   Number of Visits 13   Date for PT Re-Evaluation 11/16/15   PT Start Time 0930   PT Stop Time 1025   PT Time Calculation (min) 55 min   Activity Tolerance Patient limited by pain   Behavior During Therapy [facility] for tasks assessed/performed      Past Medical History  Diagnosis Date  . GERD (gastroesophageal reflux disease)   . Hyperlipemia   . Essential hypertension 06/16/2014  . Asthma   . Diabetes mellitus     type 2  . Kidney stones   . Anemia     as a child    Past Surgical History  Procedure Laterality Date  . Tonsillectomy    . Tubal ligation    . Ectopic pregnancy surgery    . Colonoscopy  09/23/2011    Procedure: COLONOSCOPY;  Surgeon: Juanita Craver, MD;  [facility];  Service: Endoscopy;  Laterality: N/A;  . Open reduction internal fixation (orif) distal radial fracture Left 06/21/2015    Procedure: OPEN REDUCTION INTERNAL FIXATION (ORIF) DISTAL RADIAL FRACTURE AND REPAIR AS NEEDED;  Surgeon: Iran Planas, MD;  Location: [place];  Service: Orthopedics;  Laterality: Left;    There were no vitals filed for this visit.      Subjective Assessment - 11/10/15 0936    Subjective Shoulder pain disturbed sleep last night   Currently in Pain? Yes   Pain Score 7    Pain Location Shoulder   Pain Orientation Left   Pain Descriptors / Indicators Aching   Pain Type Chronic pain   Pain Onset More than a month ago   Pain Frequency Constant   Aggravating Factors  using arm   Pain Relieving Factors rest,              [facility] PT Assessment - 11/10/15 0001    AROM   Left Shoulder  Extension 31 Degrees   Left Shoulder Flexion 128 Degrees   Left Shoulder ABduction 102 Degrees   Left Shoulder Internal Rotation 3 Degrees   Left Shoulder External Rotation 12 Degrees   Left Shoulder Horizontal ADduction 2 Degrees   PROM   Left Shoulder Flexion 120 Degrees   Left Shoulder ABduction 85 Degrees   Left Shoulder Internal Rotation 4 Degrees   Left Shoulder External Rotation 38 Degrees                     [facility] Adult PT Treatment/Exercise - 11/10/15 0001    Shoulder Exercises: Pulleys   Flexion --  25 reps   Shoulder Exercises: ROM/Strengthening   UBE (Upper Arm Bike) 2 min forw  and 2 min back L1   Moist Heat Therapy   Number Minutes Moist Heat 10 Minutes   Moist Heat Location Shoulder   Manual Therapy   Joint Mobilization flexion stretching, end range inf glides; abduction stretching, end range inf glides   Soft tissue mobilization manual STW and trigger point release most to posterior shoulder and subscapularis   Passive ROM all motoions but spent most time on long duration stretching with ER and abduction 2 sets of 4-5  min and discussed the benefits of prolonged stretching using materila in clothing as example of stretching of fibers  with distoretion of fibers over time                     PT Long Term Goals - 11/10/15 0942    PT LONG TERM GOAL #1   Title Patient improves left shoulder AROM to within 75% of normal ROM for non-dominant shoulder. (Target Date: 12/13/2015)   Status On-going   PT LONG TERM GOAL #2   Title Patient reports pain in left shoulder </= 3/10 with AROM.     Status On-going   PT LONG TERM GOAL #3   Title     Baseline did  60 feet x 5   Status Achieved   PT LONG TERM GOAL #4   Title Patient demonstrates understanding of ongoing HEP for left shoulder ROM & strength and upper body posture.     Status On-going               Plan - 11/10/15 0940    Clinical Impression Statement Pain continuing to limit use  of LT arm for functional activity. No sleep is disturbed. She is not taking medication in past 2 weeks due to running out of medication. She see MD in 2 weeks   PT Next Visit Plan continue with ROM activities within tolerable range , manual to upper arm; goal update & recert   Consulted and Agree with Plan of Care Patient      Patient will benefit from skilled therapeutic intervention in order to improve the following deficits and impairments:  Decreased activity tolerance, Decreased range of motion, Decreased strength, Increased muscle spasms, Impaired flexibility, Improper body mechanics, Postural dysfunction, Pain  Visit Diagnosis: Pain in left shoulder  Muscle weakness (generalized)  Pain in left upper arm  Stiffness of left shoulder, not elsewhere classified     Problem List Patient Active Problem List   Diagnosis Date Noted  . Distal radius fracture, left 06/21/2015  . Hyperlipidemia 06/16/2014  . Essential hypertension 06/16/2014  . Diabetes mellitus type 2, insulin dependent ([place]) 03/24/2014    Darrel Hoover  PT 11/10/2015, 10:26 AM  [facility] [address] [place], [place], [address] Phone: [phone]   Fax:  [phone]  Name: Brandy Burns MRN: [id number] Date of Birth: [birth year]

## 2015-11-13 ENCOUNTER — Ambulatory Visit: Payer: PRIVATE HEALTH INSURANCE | Admitting: Physical Therapy

## 2015-11-13 ENCOUNTER — Encounter: Payer: Self-pay | Admitting: Physical Therapy

## 2015-11-13 DIAGNOSIS — M25512 Pain in left shoulder: Secondary | ICD-10-CM

## 2015-11-13 DIAGNOSIS — M6281 Muscle weakness (generalized): Secondary | ICD-10-CM

## 2015-11-13 DIAGNOSIS — M79622 Pain in left upper arm: Secondary | ICD-10-CM

## 2015-11-13 NOTE — Therapy (Signed)
[place], [place], [address] Phone: [phone]   Fax:  [phone]  Physical Therapy Treatment  Patient Details  Name: Brandy [place] MRN: [id number] Date of Birth: 12/21/1960 Referring Provider: Iran Planas, MD  Encounter Date: 11/13/2015      PT End of Session - 11/13/15 0804    Visit Number 10   Number of Visits 18   Date for PT Re-Evaluation 11/16/15   Authorization Type Worker's Compensation 18 total visits approved   PT Start Time 0800   PT Stop Time 0846   PT Time Calculation (min) 46 min   Activity Tolerance Patient limited by pain   Behavior During Therapy [facility] for tasks assessed/performed      Past Medical History  Diagnosis Date  . GERD (gastroesophageal reflux disease)   . Hyperlipemia   . Essential hypertension 06/16/2014  . Asthma   . Diabetes mellitus     type 2  . Kidney stones   . Anemia     as a child    Past Surgical History  Procedure Laterality Date  . Tonsillectomy    . Tubal ligation    . Ectopic pregnancy surgery    . Colonoscopy  09/23/2011    Procedure: COLONOSCOPY;  Surgeon: Juanita Craver, MD;  Location: [facility];  Service: Endoscopy;  Laterality: N/A;  . Open reduction internal fixation (orif) distal radial fracture Left 06/21/2015    Procedure: OPEN REDUCTION INTERNAL FIXATION (ORIF) DISTAL RADIAL FRACTURE AND REPAIR AS NEEDED;  Surgeon: Iran Planas, MD;  Location: [place];  Service: Orthopedics;  Laterality: Left;    There were no vitals filed for this visit.      Subjective Assessment - 11/13/15 0802    Subjective Pt reports her shoulder is very sore today. Used TENS unit at home which was helpful.    Patient Stated Goals She would like her left arm to get as close to normal as possible.    Currently in Pain? Yes   Pain Score 6    Pain Location Shoulder   Pain Orientation Left   Pain Descriptors / Indicators Sore   Aggravating Factors  stretching,  raising arm, lifting   Pain Relieving Factors TENS, rest   Pain Score 4   Pain Location Wrist   Pain Orientation Left   Pain Descriptors / Indicators Aching   Aggravating Factors  lifting, grabbing   Pain Relieving Factors rest                         [facility] Adult PT Treatment/Exercise - 11/13/15 0001    Shoulder Exercises: Standing   Other Standing Exercises pendulums 5min   Shoulder Exercises: Pulleys   Flexion 3 minutes   Shoulder Exercises: ROM/Strengthening   UBE (Upper Arm Bike) 3/3 min L0   Modalities   Modalities Cryotherapy   Moist Heat Therapy   Number Minutes Moist Heat 5 Minutes  10 min alt ice/heat 1 min intervals- PT lead   Cryotherapy   Number Minutes Cryotherapy 5 Minutes   Cryotherapy Location Shoulder   Type of Cryotherapy Ice pack  10 min alt ice/heat 1 min intervals-PT lead   Manual Therapy   Manual therapy comments ROM flx & abd; inf gr 4 at end range flx & abd, SMT deltoid; guided scapular motion with active flx & abd in supine/sidelying                PT Education -  11/13/15 0935    Education provided Yes   Education Details importance of stretching and muscle soreness with improving ranges.   Person(s) Educated Patient   Methods Explanation;Demonstration;Tactile cues;Verbal cues   Comprehension Verbalized understanding;Returned demonstration;Verbal cues required;Tactile cues required;Need further instruction             PT Long Term Goals - 11/10/15 0942    PT LONG TERM GOAL #1   Title Patient improves left shoulder AROM to within 75% of normal ROM for non-dominant shoulder. (Target Date: 12/13/2015)   Status On-going   PT LONG TERM GOAL #2   Title Patient reports pain in left shoulder </= 3/10 with AROM.     Status On-going   PT LONG TERM GOAL #3   Title     Baseline did  60 feet x 5   Status Achieved   PT LONG TERM GOAL #4   Title Patient demonstrates understanding of ongoing HEP for left shoulder ROM & strength  and upper body posture.     Status On-going               Plan - 11/13/15 0936    Clinical Impression Statement Discussed asking MD for possible use of lighter pain medication due to soreness and pain in shoulder with increasing stretches. continues to lack functional strength and AROM to return to work but is progressing toward goals.    PT Treatment/Interventions ADLs/Self Care Home Management;Electrical Stimulation;Cryotherapy;Moist Heat;Ultrasound;Iontophoresis 4mg /ml Dexamethasone;Therapeutic exercise;Neuromuscular re-education;Patient/family education;Manual techniques;Passive range of motion;Dry needling;Traction   PT Next Visit Plan continue with ROM activities within tolerable range , manual to upper arm   PT Home Exercise Plan wall slides, AROM flx and scaption in mirror, contrast bath for wrist, HBB stretch with strap   Consulted and Agree with Plan of Care Patient      Patient will benefit from skilled therapeutic intervention in order to improve the following deficits and impairments:  Decreased activity tolerance, Decreased range of motion, Decreased strength, Increased muscle spasms, Impaired flexibility, Improper body mechanics, Postural dysfunction, Pain  Visit Diagnosis: Pain in left shoulder  Muscle weakness (generalized)  Pain in left upper arm     Problem List Patient Active Problem List   Diagnosis Date Noted  . Distal radius fracture, left 06/21/2015  . Hyperlipidemia 06/16/2014  . Essential hypertension 06/16/2014  . Diabetes mellitus type 2, insulin dependent ([place]) 03/24/2014    Caelan Atchley C. Lillis Nuttle PT, DPT 11/13/2015 9:39 AM    [facility] [address] [place], [place], [address] Phone: [phone]   Fax:  [phone]  Name: Brandy [place] MRN: [id number] Date of Birth: 05-Dec-1960

## 2015-11-17 ENCOUNTER — Ambulatory Visit: Payer: PRIVATE HEALTH INSURANCE | Admitting: Physical Therapy

## 2015-11-17 DIAGNOSIS — M6281 Muscle weakness (generalized): Secondary | ICD-10-CM

## 2015-11-17 DIAGNOSIS — M25512 Pain in left shoulder: Secondary | ICD-10-CM

## 2015-11-17 DIAGNOSIS — M25612 Stiffness of left shoulder, not elsewhere classified: Secondary | ICD-10-CM

## 2015-11-17 DIAGNOSIS — M79622 Pain in left upper arm: Secondary | ICD-10-CM

## 2015-11-17 MED FILL — BUPROPION SR 150 MG TABLET: 150 | 30 days supply | Qty: 30 | Fill #0

## 2015-11-17 NOTE — Therapy (Signed)
[place], [place], [address] Phone: [phone]   Fax:  [phone]  Physical Therapy Treatment  Patient Details  Name: Brandy [place] MRN: [id number] Date of Birth: 13-Feb-1961 Referring Provider: Iran Planas, MD  Encounter Date: 11/17/2015      PT End of Session - 11/17/15 0837    Visit Number 11   Number of Visits 18   Date for PT Re-Evaluation 11/16/15   Authorization Type Worker's Compensation 18 total visits approved   PT Start Time [phone]   PT Stop Time 0918   PT Time Calculation (min) 45 min      Past Medical History  Diagnosis Date  . GERD (gastroesophageal reflux disease)   . Hyperlipemia   . Essential hypertension 06/16/2014  . Asthma   . Diabetes mellitus     type 2  . Kidney stones   . Anemia     as a child    Past Surgical History  Procedure Laterality Date  . Tonsillectomy    . Tubal ligation    . Ectopic pregnancy surgery    . Colonoscopy  09/23/2011    Procedure: COLONOSCOPY;  Surgeon: Juanita Craver, MD;  [facility]: [facility];  Service: Endoscopy;  Laterality: N/A;  . Open reduction internal fixation (orif) distal radial fracture Left 06/21/2015    Procedure: OPEN REDUCTION INTERNAL FIXATION (ORIF) DISTAL RADIAL FRACTURE AND REPAIR AS NEEDED;  Surgeon: Iran Planas, MD;  Location: [place];  Service: Orthopedics;  Laterality: Left;    There were no vitals filed for this visit.      Subjective Assessment - 11/17/15 0837    Subjective I am suppose to try ultrasound today   Currently in Pain? Yes   Pain Score 5    Pain Location Shoulder   Pain Orientation Left   Pain Descriptors / Indicators Sore   Pain Score 3   Pain Location Wrist                         [facility] Adult PT Treatment/Exercise - 11/17/15 0001    Shoulder Exercises: Pulleys   Flexion 3 minutes   Shoulder Exercises: ROM/Strengthening   UBE (Upper Arm Bike) 3/3 min L 1   Modalities    Modalities Iontophoresis;Ultrasound   Ultrasound   Ultrasound Location anterior lateral shoulder and upper arm   Ultrasound Parameters 1MHZ, 100% 1.6 w/cm2 x 8 min   Ultrasound Goals Pain   Iontophoresis   Type of Iontophoresis Dexamethasone   Location anterior shoulder   Dose 1 ml   Time 6 hours   Manual Therapy   Soft tissue mobilization anterior lateral shoulder and upper arm musculature                     PT Long Term Goals - 11/10/15 [id number]    PT LONG TERM GOAL #1   Title Patient improves left shoulder AROM to within 75% of normal ROM for non-dominant shoulder. (Target Date: 12/13/2015)   Status On-going   PT LONG TERM GOAL #2   Title Patient reports pain in left shoulder </= 3/10 with AROM.     Status On-going   PT LONG TERM GOAL #3   Title     Baseline did  60 feet x 5   Status Achieved   PT LONG TERM GOAL #4   Title Patient demonstrates understanding of ongoing HEP for left shoulder ROM & strength and upper body  posture.     Status On-going               Plan - 11/17/15 [id number]    Clinical Impression Statement Used ucontinuous ultrasound and IASTM to anterior lateral shoulder and arm musculature. Pt reports still painful upon AROM after treatment. Trial of iontophoresis to anterior shoulder. Pt instructed in precautions and she verbalized understanding.    PT Next Visit Plan continue with ROM activities within tolerable range , manual to upper arm- assess benefit of [place], ionto, continue if helpful      Patient will benefit from skilled therapeutic intervention in order to improve the following deficits and impairments:  Decreased activity tolerance, Decreased range of motion, Decreased strength, Increased muscle spasms, Impaired flexibility, Improper body mechanics, Postural dysfunction, Pain  Visit Diagnosis: Pain in left shoulder  Muscle weakness (generalized)  Pain in left upper arm  Stiffness of left shoulder, not elsewhere  classified     Problem List Patient Active Problem List   Diagnosis Date Noted  . Distal radius fracture, left 06/21/2015  . Hyperlipidemia 06/16/2014  . Essential hypertension 06/16/2014  . Diabetes mellitus type 2, insulin dependent ([place]) 03/24/2014    [name], PTA 11/17/2015, 9:28 AM  [place], [place], [address] Phone: [phone]   Fax:  [phone]  Name: Brandy [place] MRN: [id number] Date of Birth: 03-Apr-1961

## 2015-11-20 ENCOUNTER — Ambulatory Visit: Payer: PRIVATE HEALTH INSURANCE

## 2015-11-20 DIAGNOSIS — M25532 Pain in left wrist: Secondary | ICD-10-CM

## 2015-11-20 DIAGNOSIS — M25512 Pain in left shoulder: Secondary | ICD-10-CM | POA: Diagnosis not present

## 2015-11-20 DIAGNOSIS — M6281 Muscle weakness (generalized): Secondary | ICD-10-CM

## 2015-11-20 DIAGNOSIS — M25632 Stiffness of left wrist, not elsewhere classified: Secondary | ICD-10-CM

## 2015-11-20 DIAGNOSIS — M25612 Stiffness of left shoulder, not elsewhere classified: Secondary | ICD-10-CM

## 2015-11-20 DIAGNOSIS — M79622 Pain in left upper arm: Secondary | ICD-10-CM

## 2015-11-20 NOTE — Patient Instructions (Signed)
Reveiwed ionto precautions

## 2015-11-20 NOTE — Therapy (Signed)
[place], [place], [address] Phone: [phone]   Fax:  [phone]  Physical Therapy Treatment  Patient Details  Name: Brandy [place] MRN: [id number] Date of Birth: 1960-08-30 Referring Provider: Iran Planas, MD  Encounter Date: 11/20/2015      PT End of Session - 11/20/15 0806    Visit Number 12   Number of Visits 18   Date for PT Re-Evaluation 11/16/15   PT Start Time 0800   PT Stop Time 0845   PT Time Calculation (min) 45 min   Activity Tolerance Patient tolerated treatment well;Patient limited by pain   Behavior During Therapy [facility] for tasks assessed/performed      Past Medical History  Diagnosis Date  . GERD (gastroesophageal reflux disease)   . Hyperlipemia   . Essential hypertension 06/16/2014  . Asthma   . Diabetes mellitus     type 2  . Kidney stones   . Anemia     as a child    Past Surgical History  Procedure Laterality Date  . Tonsillectomy    . Tubal ligation    . Ectopic pregnancy surgery    . Colonoscopy  09/23/2011    Procedure: COLONOSCOPY;  Surgeon: Juanita Craver, MD;  [facility];  Service: Endoscopy;  Laterality: N/A;  . Open reduction internal fixation (orif) distal radial fracture Left 06/21/2015    Procedure: OPEN REDUCTION INTERNAL FIXATION (ORIF) DISTAL RADIAL FRACTURE AND REPAIR AS NEEDED;  Surgeon: Iran Planas, MD;  Location: [place];  Service: Orthopedics;  Laterality: Left;    There were no vitals filed for this visit.      Subjective Assessment - 11/20/15 0807    Subjective Doing OK . PAin shoulder 5  wrist 4   Currently in Pain? Yes   Pain Score 5    Pain Location Shoulder   Pain Orientation Left   Pain Descriptors / Indicators Sore   Pain Type Chronic pain   Pain Onset More than a month ago   Pain Frequency Constant   Multiple Pain Sites Yes   Pain Score 4   Pain Location Wrist   Pain Orientation Left   Pain Descriptors / Indicators  Aching   Pain Type Chronic pain   Pain Onset More than a month ago            [facility] PT Assessment - 11/20/15 0001    AROM   Left Shoulder Extension 35 Degrees   Left Shoulder Flexion 117 Degrees   Left Shoulder ABduction 92 Degrees   Left Shoulder Internal Rotation 3 Degrees   Left Shoulder External Rotation 15 Degrees   Left Shoulder Horizontal ADduction 79 Degrees  hor abd -5                     [facility] Adult PT Treatment/Exercise - 11/20/15 0001    Iontophoresis   Type of Iontophoresis Dexamethasone   Location anterior shoulder  LT   Dose 1 ml   Time 6 hours   Manual Therapy   Joint Mobilization combo ER with distraction and flexion and abduction and IR with distraction and extension  30 reps x 5 each   Soft tissue mobilization anterior lateral shoulder and upper arm musculature   Passive ROM all motoions but spent most time on long duration stretching ER and IR      UBE 3eys x 4 min ov min for and 3 back L0 then pulley 5 min over  head stretch. Then active ROM all plans x 5            PT Education - 11/20/15 0844    Education provided Yes   Education Details ionto precautions . Continued limited ROM and possible options after she sees MD   Person(s) Educated Patient   Methods Explanation   Comprehension Verbalized understanding             PT Long Term Goals - 11/10/15 [id number]    PT LONG TERM GOAL #1   Title Patient improves left shoulder AROM to within 75% of normal ROM for non-dominant shoulder. (Target Date: 12/13/2015)   Status On-going   PT LONG TERM GOAL #2   Title Patient reports pain in left shoulder </= 3/10 with AROM.     Status On-going   PT LONG TERM GOAL #3   Title     Baseline did  60 feet x 5   Status Achieved   PT LONG TERM GOAL #4   Title Patient demonstrates understanding of ongoing HEP for left shoulder ROM & strength and upper body posture.     Status On-going               Plan - 11/20/15 0807    Clinical  Impression Statement She felt ionto was helpful so reapplied. Her range is still limited and most motions not improved since eval 10/05/15. She will see MD in a week so will continue until then   PT Next Visit Plan continue with ROM activities within tolerable range , manual to upper arm-   Consulted and Agree with Plan of Care Patient      Patient will benefit from skilled therapeutic intervention in order to improve the following deficits and impairments:  Decreased activity tolerance, Decreased range of motion, Decreased strength, Increased muscle spasms, Impaired flexibility, Improper body mechanics, Postural dysfunction, Pain  Visit Diagnosis: Pain in left shoulder  Muscle weakness (generalized)  Stiffness of left shoulder, not elsewhere classified  Pain in left upper arm  Stiffness of left wrist, not elsewhere classified  Pain, joint, forearm, left     Problem List Patient Active Problem List   Diagnosis Date Noted  . Distal radius fracture, left 06/21/2015  . Hyperlipidemia 06/16/2014  . Essential hypertension 06/16/2014  . Diabetes mellitus type 2, insulin dependent ([place]) 03/24/2014    Darrel Hoover  PT 11/20/2015, 8:47 AM  [facility] [address] [place], [place], [address] Phone: [phone]   Fax:  [phone]  Name: Brandy [place] MRN: [id number] Date of Birth: 09-16-1960

## 2015-11-22 ENCOUNTER — Ambulatory Visit: Payer: PRIVATE HEALTH INSURANCE | Admitting: Physical Therapy

## 2015-11-22 DIAGNOSIS — M79622 Pain in left upper arm: Secondary | ICD-10-CM

## 2015-11-22 DIAGNOSIS — M6281 Muscle weakness (generalized): Secondary | ICD-10-CM

## 2015-11-22 DIAGNOSIS — M25512 Pain in left shoulder: Secondary | ICD-10-CM

## 2015-11-22 DIAGNOSIS — M25612 Stiffness of left shoulder, not elsewhere classified: Secondary | ICD-10-CM

## 2015-11-22 NOTE — Therapy (Signed)
[place], [place], [address] Phone: [phone]   Fax:  [phone]  Physical Therapy Treatment  Patient Details  Name: Brandy [place] MRN: [id number] Date of Birth: Apr 07, 1961 Referring Provider: Iran Planas, MD  Encounter Date: 11/22/2015      PT End of Session - 11/22/15 0848    Visit Number 13   Number of Visits 18   Date for PT Re-Evaluation 11/16/15   Authorization Type Worker's Compensation 18 total visits approved   PT Start Time [id number]   PT Stop Time 0938   PT Time Calculation (min) 54 min      Past Medical History  Diagnosis Date  . GERD (gastroesophageal reflux disease)   . Hyperlipemia   . Essential hypertension 06/16/2014  . Asthma   . Diabetes mellitus     type 2  . Kidney stones   . Anemia     as a child    Past Surgical History  Procedure Laterality Date  . Tonsillectomy    . Tubal ligation    . Ectopic pregnancy surgery    . Colonoscopy  09/23/2011    Procedure: COLONOSCOPY;  Surgeon: Juanita Craver, MD;  [facility];  Service: Endoscopy;  Laterality: N/A;  . Open reduction internal fixation (orif) distal radial fracture Left 06/21/2015    Procedure: OPEN REDUCTION INTERNAL FIXATION (ORIF) DISTAL RADIAL FRACTURE AND REPAIR AS NEEDED;  Surgeon: Iran Planas, MD;  Location: [place];  Service: Orthopedics;  Laterality: Left;    There were no vitals filed for this visit.      Subjective Assessment - 11/22/15 0848    Currently in Pain? Yes   Pain Score 4    Pain Location Shoulder   Pain Orientation Left   Pain Score 4   Pain Location Wrist   Pain Orientation Right            [facility] PT Assessment - 11/22/15 0001    PROM   Left Shoulder Flexion 130 Degrees   Left Shoulder ABduction 90 Degrees   Left Shoulder Internal Rotation 35 Degrees  @ 50 degrees abduction   Left Shoulder External Rotation 45 Degrees  @ 45 degrees abduction                      [facility] Adult PT Treatment/Exercise - 11/22/15 0001    Shoulder Exercises: Pulleys   Flexion 3 minutes   Shoulder Exercises: ROM/Strengthening   UBE (Upper Arm Bike) 3/3 min L 2   Shoulder Exercises: Stretch   Internal Rotation Stretch 3 reps, 30 assisted reach behind using pole   External Rotation Stretch 3 reps;30 seconds   Table Stretch - Flexion 3 reps;30 seconds   Table Stretch - Abduction 3 reps;30 seconds   Table Stretch - External Rotation 3 reps;30 seconds, also doorway unilateral 3x30   Moist Heat Therapy   Number Minutes Moist Heat 10 Minutes   Moist Heat Location Shoulder  during manual    Iontophoresis   Type of Iontophoresis Dexamethasone   Location anterior shoulder  LT   Dose 1 ml   Time 6 hours   Manual Therapy   Joint Mobilization flexion stretching, end range inf glides; abduction stretching, end range inf glides   Passive ROM all motoions but spent most time on long duration stretching ER and IR                PT Education - 11/22/15 [id number]  Education provided Yes   Education Details table stretches for flexion, abduction and ER   Person(s) Educated Patient   Methods Explanation;Handout   Comprehension Verbalized understanding             PT Long Term Goals - 11/10/15 0942    PT LONG TERM GOAL #1   Title Patient improves left shoulder AROM to within 75% of normal ROM for non-dominant shoulder. (Target Date: 12/13/2015)   Status On-going   PT LONG TERM GOAL #2   Title Patient reports pain in left shoulder </= 3/10 with AROM.     Status On-going   PT LONG TERM GOAL #3   Title     Baseline did  60 feet x 5   Status Achieved   PT LONG TERM GOAL #4   Title Patient demonstrates understanding of ongoing HEP for left shoulder ROM & strength and upper body posture.     Status On-going               Plan - 11/22/15 0955    Clinical Impression Statement Pt is now able to use L UE to place hair up using  clip. She is unable to reach behind her back, only to left glute. Her PROM has improved however remains limited and painful. HMP used during manual stretchng to decrease pain and increase tissue flexibility. After manual pt instructed in PROM Table exercises as well as IR, ER stretches. All added to HEP. Pt sees MD for follow up prior to her next PT appointment.    PT Next Visit Plan continue with ROM activities within tolerable range REVIEW STRETCHES FROM THIS VISIT , manual to upper arm-ionto. SEE WHAT MD SAID   PT Home Exercise Plan wall slides, AROM flx and scaption in mirror, contrast bath for wrist, HBB stretch with strap, table slides, ER and IR stretches at doorway and behind back      Patient will benefit from skilled therapeutic intervention in order to improve the following deficits and impairments:  Decreased activity tolerance, Decreased range of motion, Decreased strength, Increased muscle spasms, Impaired flexibility, Improper body mechanics, Postural dysfunction, Pain  Visit Diagnosis: Pain in left shoulder  Muscle weakness (generalized)  Stiffness of left shoulder, not elsewhere classified  Pain in left upper arm     Problem List Patient Active Problem List   Diagnosis Date Noted  . Distal radius fracture, left 06/21/2015  . Hyperlipidemia 06/16/2014  . Essential hypertension 06/16/2014  . Diabetes mellitus type 2, insulin dependent ([place]) 03/24/2014    [name], [place] 11/22/2015, 10:00 AM  [facility] [address] [place], [place], [address] Phone: [phone]   Fax:  [phone]  Name: WAYNE STEFANICK MRN: [id number] Date of Birth: April 12, 1961

## 2015-11-22 NOTE — Patient Instructions (Addendum)
Flexion (Passive)    Sitting upright, slide forearm forward along table, bending from the waist until a stretch is felt. Hold _30___ seconds. Repeat _3___ times. Do __2__ sessions per day.   Abduction (Passive)    With arm out to side, resting on table, lower head toward arm, keeping trunk away from table. Hold __30__ seconds. Repeat __3__ times. Do _2___ sessions per day.       Table Stretch: External Rotation    Sit with left arm on table. Lean forward until a stretch is felt in shoulder. Hold __30__ seconds. Repeat _3___ times. Do __2__ sessions per day.  ALSO GIVEN SINGLE ARM EXTERNAL ROTATION AT DOORWAY 3 x 30 sec 2 x per day  ALSO GIVEN INTERNAL ROTATION USING POLE TO HELP GRAB THE OTHER ARM 3x 30 sec, 2 x per day

## 2015-11-28 ENCOUNTER — Ambulatory Visit: Payer: PRIVATE HEALTH INSURANCE | Admitting: Physical Therapy

## 2015-11-28 DIAGNOSIS — M25512 Pain in left shoulder: Secondary | ICD-10-CM | POA: Diagnosis not present

## 2015-11-28 DIAGNOSIS — M6281 Muscle weakness (generalized): Secondary | ICD-10-CM

## 2015-11-28 DIAGNOSIS — M79622 Pain in left upper arm: Secondary | ICD-10-CM

## 2015-11-28 DIAGNOSIS — M25612 Stiffness of left shoulder, not elsewhere classified: Secondary | ICD-10-CM

## 2015-11-28 NOTE — Therapy (Signed)
[place], [place], [address] Phone: [phone]   Fax:  [phone]  Physical Therapy Treatment  Patient Details  Name: Brandy Burns MRN: [id number] Date of Birth: 1961-05-15 Referring Provider: Iran Planas, MD  Encounter Date: 11/28/2015      PT End of Session - 11/28/15 0802    Visit Number 14   Number of Visits 18   Date for PT Re-Evaluation 12/10/15   Authorization Type Worker's Compensation 18 total visits approved   PT Start Time 0800   PT Stop Time 0900   PT Time Calculation (min) 60 min      Past Medical History  Diagnosis Date  . GERD (gastroesophageal reflux disease)   . Hyperlipemia   . Essential hypertension 06/16/2014  . Asthma   . Diabetes mellitus     type 2  . Kidney stones   . Anemia     as a child    Past Surgical History  Procedure Laterality Date  . Tonsillectomy    . Tubal ligation    . Ectopic pregnancy surgery    . Colonoscopy  09/23/2011    Procedure: COLONOSCOPY;  Surgeon: Juanita Craver, MD;  Location: [facility];  Service: Endoscopy;  Laterality: N/A;  . Open reduction internal fixation (orif) distal radial fracture Left 06/21/2015    Procedure: OPEN REDUCTION INTERNAL FIXATION (ORIF) DISTAL RADIAL FRACTURE AND REPAIR AS NEEDED;  Surgeon: Iran Planas, MD;  Location: [facility];  Service: Orthopedics;  Laterality: Left;    There were no vitals filed for this visit.      Subjective Assessment - 11/28/15 0800    Subjective See Dr. Delilah Shan June 28th. They are going to do an ultrasound and an injection. Go back to Dr. Apolonio Schneiders [id number].    Pertinent History DM2 insulin dependent, essential HTN, distal radius fracture with ORIF,    Currently in Pain? Yes   Pain Score 4    Pain Location Shoulder   Pain Orientation Left   Aggravating Factors  stretches, raising arm, reach across body   Pain Relieving Factors TENS, rest                         [facility]  Adult PT Treatment/Exercise - 11/28/15 0001    Shoulder Exercises: Pulleys   Flexion 3 minutes   Shoulder Exercises: ROM/Strengthening   UBE (Upper Arm Bike) 3/3 min L 2   Other ROM/Strengthening Exercises UE Ranger for AAROM overhead flexion at level 13 on wall x 20 then IR AAROM from floor x 15 followed by towl stretch for internal rotation    Shoulder Exercises: Stretch   Internal Rotation Stretch 3 reps   Internal Rotation Stretch Limitations towel stretch   Table Stretch - Flexion 3 reps;30 seconds   Table Stretch - Abduction 3 reps;30 seconds   Table Stretch - External Rotation 3 reps;30 seconds   Moist Heat Therapy   Number Minutes Moist Heat 10 Minutes   Moist Heat Location Shoulder  seated   Manual Therapy   Joint Mobilization flexion stretching, end range A/P glides; abduction stretching, end range inf glides   Passive ROM Flexion, abduction, ER, IR                     PT Long Term Goals - 11/28/15 [id number]    PT LONG TERM GOAL #1   Title Patient improves left shoulder AROM to within 75% of normal ROM for  non-dominant shoulder. (Target Date: 12/13/2015)   Time 5   Period Weeks   Status On-going   PT LONG TERM GOAL #2   Title Patient reports pain in left shoulder </= 3/10 with AROM.     Time 5   Period Weeks   Status On-going   PT LONG TERM GOAL #3   Title Pt able to lift 10# tray simulating job tasks with proper mechanics independently.    Baseline did  60 feet x 5   Time 5   Period Weeks   Status Achieved   PT LONG TERM GOAL #4   Title Patient demonstrates understanding of ongoing HEP for left shoulder ROM & strength and upper body posture.     Time 5   Period Weeks   Status On-going               Plan - 11/28/15 [id number]    Clinical Impression Statement Pt saw MD yesterday who recommended [place] and injection. She is scheduled for this next week. Reviewed pt's stretches issued last visit with cues to stretch in a comfortable range. All motions  painful. Used UE ranger fro AAROm into flexion and IR. Pt also demonstrates decreased IR AROM  on right shoulder.    PT Next Visit Plan continue with ROM activities within tolerable range , manual to upper arm-ionto, CHECK GOALS AND ROM   PT Home Exercise Plan wall slides, AROM flx and scaption in mirror, contrast bath for wrist, HBB stretch with strap, table slides, ER and IR stretches at doorway and behind back      Patient will benefit from skilled therapeutic intervention in order to improve the following deficits and impairments:  Decreased activity tolerance, Decreased range of motion, Decreased strength, Increased muscle spasms, Impaired flexibility, Improper body mechanics, Postural dysfunction, Pain  Visit Diagnosis: Pain in left shoulder  Muscle weakness (generalized)  Stiffness of left shoulder, not elsewhere classified  Pain in left upper arm     Problem List Patient Active Problem List   Diagnosis Date Noted  . Distal radius fracture, left 06/21/2015  . Hyperlipidemia 06/16/2014  . Essential hypertension 06/16/2014  . Diabetes mellitus type 2, insulin dependent ([place]) 03/24/2014    [name], PTA 11/28/2015, 9:14 AM  [place], [place], [address] Phone: [phone]   Fax:  [phone]  Name: Brandy Burns MRN: [id number] Date of Birth: March 02, 1961

## 2015-11-30 ENCOUNTER — Ambulatory Visit: Payer: PRIVATE HEALTH INSURANCE | Admitting: Physical Therapy

## 2015-11-30 DIAGNOSIS — M79622 Pain in left upper arm: Secondary | ICD-10-CM

## 2015-11-30 DIAGNOSIS — M25612 Stiffness of left shoulder, not elsewhere classified: Secondary | ICD-10-CM

## 2015-11-30 DIAGNOSIS — M6281 Muscle weakness (generalized): Secondary | ICD-10-CM

## 2015-11-30 DIAGNOSIS — M25512 Pain in left shoulder: Secondary | ICD-10-CM

## 2015-11-30 NOTE — Patient Instructions (Addendum)
Over Head Pull: Narrow Grip       On back, knees bent, feet flat, band across thighs, elbows straight but relaxed. Pull hands apart (start). Keeping elbows straight, bring arms up and over head, hands toward floor. Keep pull steady on band. Hold momentarily. Return slowly, keeping pull steady, back to start. Repeat 10___ times. Band color ___y___   Side Pull: Double Arm   On back, knees bent, feet flat. Arms perpendicular to body, shoulder level, elbows straight but relaxed. Pull arms out to sides, elbows straight. Resistance band comes across collarbones, hands toward floor. Hold momentarily. Slowly return to starting position. Repeat _10__ times. Band color __y___   Sash   On back, knees bent, feet flat, left hand on left hip, right hand above left. Pull right arm DIAGONALLY (hip to shoulder) across chest. Bring right arm along head toward floor. Hold momentarily. Slowly return to starting position. Repeat _10__ times. Do with left arm. Band color ____y__   Shoulder Rotation: Double Arm   On back, knees bent, feet flat, elbows tucked at sides, bent 90, hands palms up. Pull hands apart and down toward floor, keeping elbows near sides. Hold momentarily. Slowly return to starting position. Repeat 10___ times. Band color ___y___

## 2015-11-30 NOTE — Therapy (Signed)
[place], [place], [address] Phone: [phone]   Fax:  [phone]  Physical Therapy Treatment  Patient Details  Name: Brandy Burns MRN: [id number] Date of Birth: [birth year] Referring Provider: Iran Planas, MD  Encounter Date: 11/30/2015      PT End of Session - 11/30/15 0807    Visit Number 15   Number of Visits 18   Date for PT Re-Evaluation 12/10/15   Authorization Type Worker's Compensation 18 total visits approved   PT Start Time 0800   PT Stop Time 0900   PT Time Calculation (min) 60 min      Past Medical History  Diagnosis Date  . GERD (gastroesophageal reflux disease)   . Hyperlipemia   . Essential hypertension 06/16/2014  . Asthma   . Diabetes mellitus     type 2  . Kidney stones   . Anemia     as a child    Past Surgical History  Procedure Laterality Date  . Tonsillectomy    . Tubal ligation    . Ectopic pregnancy surgery    . Colonoscopy  09/23/2011    Procedure: COLONOSCOPY;  Surgeon: Juanita Craver, MD;  [facility];  Service: Endoscopy;  Laterality: N/A;  . Open reduction internal fixation (orif) distal radial fracture Left 06/21/2015    Procedure: OPEN REDUCTION INTERNAL FIXATION (ORIF) DISTAL RADIAL FRACTURE AND REPAIR AS NEEDED;  Surgeon: Iran Planas, MD;  Location: [facility];  Service: Orthopedics;  Laterality: Left;    There were no vitals filed for this visit.      Subjective Assessment - 11/30/15 0807    Subjective I am anxious about the injection. I hate needles.    Currently in Pain? Yes   Pain Score 6    Pain Location Shoulder   Pain Orientation Left            [facility] PT Assessment - 11/30/15 0001    AROM   Left Shoulder Flexion 118 Degrees   Left Shoulder ABduction 100 Degrees   Left Shoulder Internal Rotation 38 Degrees  reach to left glute   Left Shoulder External Rotation 22 Degrees  reach to back of upper trap   PROM   Left Shoulder  Flexion 134 Degrees   Left Shoulder ABduction 100 Degrees   Left Shoulder Internal Rotation 35 Degrees  @ 50 degrees abduction   Left Shoulder External Rotation 45 Degrees  @ 45 degrees abduction                     [facility] Adult PT Treatment/Exercise - 11/30/15 0001    Shoulder Exercises: Supine   Horizontal ABduction 15 reps;Theraband   Theraband Level (Shoulder Horizontal ABduction) Level 2 (Red)   External Rotation Strengthening;Both;15 reps;Theraband   Theraband Level (Shoulder External Rotation) Level 2 (Red)   Other Supine Exercises sash, and pullovers with yellow band x10 each    Shoulder Exercises: Standing   Extension Strengthening;15 reps   Theraband Level (Shoulder Extension) Level 2 (Red)   Row Strengthening;Both;15 reps;Theraband   Theraband Level (Shoulder Row) Level 2 (Red)   Shoulder Exercises: Pulleys   Flexion --   Shoulder Exercises: ROM/Strengthening   UBE (Upper Arm Bike) 3/3 min L 2   Shoulder Exercises: Stretch   Internal Rotation Stretch Limitations sleeper stretch 3 x 30 sec   External Rotation Stretch 3 reps;30 seconds  at doorway unilateral   Moist Heat Therapy   Number Minutes Moist Heat  15 Minutes   Moist Heat Location Shoulder   Manual Therapy   Joint Mobilization flexion stretching, end range A/P glides; abduction stretching, end range inf glides   Passive ROM Flexion, abduction, ER, IR                PT Education - 11/30/15 0838    Education provided Yes   Education Details Supine scap stab yellow band; sleeper stretch   Person(s) Educated Patient   Methods Explanation;Handout   Comprehension Verbalized understanding             PT Long Term Goals - 11/28/15 0909    PT LONG TERM GOAL #1   Title Patient improves left shoulder AROM to within 75% of normal ROM for non-dominant shoulder. (Target Date: 12/13/2015)   Time 5   Period Weeks   Status On-going   PT LONG TERM GOAL #2   Title Patient reports pain in left  shoulder </= 3/10 with AROM.     Time 5   Period Weeks   Status On-going   PT LONG TERM GOAL #3   Title Pt able to lift 10# tray simulating job tasks with proper mechanics independently.    Baseline did  60 feet x 5   Time 5   Period Weeks   Status Achieved   PT LONG TERM GOAL #4   Title Patient demonstrates understanding of ongoing HEP for left shoulder ROM & strength and upper body posture.     Time 5   Period Weeks   Status On-going               Plan - 11/30/15 1025    Clinical Impression Statement Instructed pt in Supine scap stab with good tolerance. Some achiness in hand and wrist on left. Pt also instructed in sidelying sleeper stretch, both added to HEP. ROM slightly improved. No additional goals met.    PT Next Visit Plan review supine scap stab and sleeper stretch, manual to upper arm, ionto prn, email sent to request more WC visits   PT Home Exercise Plan wall slides, AROM flx and scaption in mirror, contrast bath for wrist, HBB stretch with strap, table slides, ER and IR stretches at doorway and behind back, sleeper stretch, supine scap stab series, yellow band      Patient will benefit from skilled therapeutic intervention in order to improve the following deficits and impairments:  Decreased activity tolerance, Decreased range of motion, Decreased strength, Increased muscle spasms, Impaired flexibility, Improper body mechanics, Postural dysfunction, Pain  Visit Diagnosis: Pain in left shoulder  Muscle weakness (generalized)  Stiffness of left shoulder, not elsewhere classified  Pain in left upper arm     Problem List Patient Active Problem List   Diagnosis Date Noted  . Distal radius fracture, left 06/21/2015  . Hyperlipidemia 06/16/2014  . Essential hypertension 06/16/2014  . Diabetes mellitus type 2, insulin dependent ([place]) 03/24/2014    [name], [place] 11/30/2015, 10:18 AM  [place], [place], [address] Phone: [phone]   Fax:  [phone]  Name: Brandy Burns MRN: [id number] Date of Birth: 02/07/1961

## 2015-12-04 ENCOUNTER — Ambulatory Visit: Payer: PRIVATE HEALTH INSURANCE

## 2015-12-04 DIAGNOSIS — M25512 Pain in left shoulder: Secondary | ICD-10-CM

## 2015-12-04 DIAGNOSIS — M25632 Stiffness of left wrist, not elsewhere classified: Secondary | ICD-10-CM

## 2015-12-04 DIAGNOSIS — M25612 Stiffness of left shoulder, not elsewhere classified: Secondary | ICD-10-CM

## 2015-12-04 DIAGNOSIS — M6281 Muscle weakness (generalized): Secondary | ICD-10-CM

## 2015-12-04 NOTE — Therapy (Signed)
[place], [place], [address] Phone: [phone]   Fax:  [phone]  Physical Therapy Treatment  Patient Details  Name: Brandy Burns MRN: [id number] Date of Birth: December 17, 1960 Referring Provider: Iran Planas, MD  Encounter Date: 12/04/2015      PT End of Session - 12/04/15 0834    Visit Number 16   Number of Visits 28   Date for PT Re-Evaluation 01/12/16   Authorization Type Worker's Compensation 28 total visits approved   PT Start Time 0745   PT Stop Time 0845   PT Time Calculation (min) 60 min   Activity Tolerance Patient tolerated treatment well;Patient limited by pain   Behavior During Therapy [facility] for tasks assessed/performed      Past Medical History  Diagnosis Date  . GERD (gastroesophageal reflux disease)   . Hyperlipemia   . Essential hypertension 06/16/2014  . Asthma   . Diabetes mellitus     type 2  . Kidney stones   . Anemia     as a child    Past Surgical History  Procedure Laterality Date  . Tonsillectomy    . Tubal ligation    . Ectopic pregnancy surgery    . Colonoscopy  09/23/2011    Procedure: COLONOSCOPY;  Surgeon: Juanita Craver, MD;  [facility];  Service: Endoscopy;  Laterality: N/A;  . Open reduction internal fixation (orif) distal radial fracture Left 06/21/2015    Procedure: OPEN REDUCTION INTERNAL FIXATION (ORIF) DISTAL RADIAL FRACTURE AND REPAIR AS NEEDED;  Surgeon: Iran Planas, MD;  Location: [facility];  Service: Orthopedics;  Laterality: Left;    There were no vitals filed for this visit.      Subjective Assessment - 12/04/15 0749    Subjective My arm real sore this AM. Doing exercises Injection 6/28 in shoulder. Can get hand further behind back. She has been washing dishes and doing laudry and basic home tasks.    Limitations --  Self care , lifting heavy objects lik carry groceries , reaching to get items out of cabinet   Currently in Pain? Yes    Pain Score 6    Pain Location Shoulder   Pain Orientation Left   Pain Descriptors / Indicators Sore   Pain Type Chronic pain   Pain Onset More than a month ago   Pain Frequency Constant   Aggravating Factors  stretch ,reaching,    Pain Relieving Factors Tends rest meds   Multiple Pain Sites Yes   Pain Score 4   Pain Location Wrist   Pain Orientation Left   Pain Descriptors / Indicators Aching   Pain Type Chronic pain   Pain Onset More than a month ago   Pain Frequency Constant   Aggravating Factors  using hand   Pain Relieving Factors rest                         [facility] Adult PT Treatment/Exercise - 12/04/15 0001    Therapeutic Activites    Therapeutic Activities Lifting;Other Therapeutic Activities   Lifting picking box off floor and carry  and lifting to over head ( second shelf  in upper cabinet)   2-3-5 pounds with pain level 4/10 not increaseing  5 reps each   Other Therapeutic Activities carry with LT arm    10 pounds  x 100 feet , 12 pounds x100 feet both with pain 4/10, no change   Shoulder Exercises: Sidelying  Other Sidelying Exercises sleeper stretch 30 sec x 3   Shoulder Exercises: Standing   Extension Strengthening;Both;20 reps   Theraband Level (Shoulder Extension) Level 3 (Green)   Row Strengthening;Both;20 reps   Theraband Level (Shoulder Row) Level 3 (Green)   Other Standing Exercises Abduction with red band with arms straight and hands behind back with tactile cues for scapula depression then bent rows 3 pounds x 12 and extension 3 pounds x 12 and hor abduction 3 pounds x 12   Shoulder Exercises: Pulleys   Flexion 3 minutes   Shoulder Exercises: ROM/Strengthening   UBE (Upper Arm Bike) 3/3 min L 2   Moist Heat Therapy   Number Minutes Moist Heat 15 Minutes   Moist Heat Location Shoulder                     PT Long Term Goals - 12/04/15 0836    PT LONG TERM GOAL #1   Title Patient improves left shoulder AROM to within 75% of  normal ROM for non-dominant shoulder. (Target Date: 12/13/2015)   Status On-going   PT LONG TERM GOAL #2   Title Patient reports pain in left shoulder </= 3/10 with AROM.     Status On-going   PT LONG TERM GOAL #3   Title Pt able to lift 10# tray simulating job tasks with proper mechanics independently.    Status Achieved   PT LONG TERM GOAL #4   Title Patient demonstrates understanding of ongoing HEP for left shoulder ROM & strength and upper body posture.     Status On-going               Plan - 12/04/15 [id number]    Clinical Impression Statement Pain unchanged. She tolerated activity today without increased pian .Marland Kitchen May need to progress with lifting /carry for functional improveements.anlong with ROM strength. She has been approved for 10 more visits after the first 18   PT Next Visit Plan Continue strength ROM , functional activity, review stretches   Consulted and Agree with Plan of Care Patient      Patient will benefit from skilled therapeutic intervention in order to improve the following deficits and impairments:  Decreased activity tolerance, Decreased range of motion, Decreased strength, Increased muscle spasms, Impaired flexibility, Improper body mechanics, Postural dysfunction, Pain  Visit Diagnosis: Pain in left shoulder  Muscle weakness (generalized)  Stiffness of left shoulder, not elsewhere classified  Stiffness of left wrist, not elsewhere classified     Problem List Patient Active Problem List   Diagnosis Date Noted  . Distal radius fracture, left 06/21/2015  . Hyperlipidemia 06/16/2014  . Essential hypertension 06/16/2014  . Diabetes mellitus type 2, insulin dependent ([place]) 03/24/2014    Darrel Hoover  PT 12/04/2015, 8:39 AM  [facility] [address] [place], [place], [address] Phone: [phone]   Fax:  [phone]  Name: Brandy Burns MRN: [id number] Date of Birth: [birth year]

## 2015-12-07 ENCOUNTER — Encounter: Payer: Self-pay | Admitting: Physical Therapy

## 2015-12-07 ENCOUNTER — Ambulatory Visit: Payer: PRIVATE HEALTH INSURANCE | Admitting: Physical Therapy

## 2015-12-07 DIAGNOSIS — M25612 Stiffness of left shoulder, not elsewhere classified: Secondary | ICD-10-CM

## 2015-12-07 DIAGNOSIS — M6281 Muscle weakness (generalized): Secondary | ICD-10-CM

## 2015-12-07 DIAGNOSIS — M25512 Pain in left shoulder: Secondary | ICD-10-CM | POA: Diagnosis not present

## 2015-12-07 NOTE — Therapy (Signed)
[place], [place], [address] Phone: [phone]   Fax:  [phone]  Physical Therapy Treatment  Patient Details  Name: ZANYIA NEMECHEK MRN: [id number] Date of Birth: 05/31/1961 Referring Provider: Iran Planas, MD  Encounter Date: 12/07/2015      PT End of Session - 12/07/15 0758    Visit Number 17   Number of Visits 28   Date for PT Re-Evaluation 01/12/16   Authorization Type Worker's Compensation 28 total visits approved   PT Start Time 0800   PT Stop Time 0853   PT Time Calculation (min) 53 min   Activity Tolerance Patient tolerated treatment well;Patient limited by pain   Behavior During Therapy [facility] for tasks assessed/performed      Past Medical History  Diagnosis Date  . GERD (gastroesophageal reflux disease)   . Hyperlipemia   . Essential hypertension 06/16/2014  . Asthma   . Diabetes mellitus     type 2  . Kidney stones   . Anemia     as a child    Past Surgical History  Procedure Laterality Date  . Tonsillectomy    . Tubal ligation    . Ectopic pregnancy surgery    . Colonoscopy  09/23/2011    Procedure: COLONOSCOPY;  Surgeon: Juanita Craver, MD;  [facility];  Service: Endoscopy;  Laterality: N/A;  . Open reduction internal fixation (orif) distal radial fracture Left 06/21/2015    Procedure: OPEN REDUCTION INTERNAL FIXATION (ORIF) DISTAL RADIAL FRACTURE AND REPAIR AS NEEDED;  Surgeon: Iran Planas, MD;  Location: [facility];  Service: Orthopedics;  Laterality: Left;    There were no vitals filed for this visit.      Subjective Assessment - 12/07/15 0801    Subjective Pt denies any pain this morning following injection yesterday. still feels weak in liftig and grabbing objects from overhead. Pt reports fear of injury if returning to work before her shoulder is better.    Currently in Pain? No/denies   Aggravating Factors  reaching, lifting.                           [facility] Adult PT Treatment/Exercise - 12/07/15 0001    Shoulder Exercises: Supine   Other Supine Exercises serratus punches x30   Shoulder Exercises: Prone   External Rotation 20 reps   Horizontal ABduction 1 20 reps   Other Prone Exercises triceps kicks 1lb   Shoulder Exercises: Standing   Flexion Limitations shelf reach 3x10 1lb   Row Weight (lbs) 1lb   Row Limitations bent over rows   Other Standing Exercises deltoid pulses flexion, scaption with and without elbows flexed   Other Standing Exercises Cw/CCW circles tennis ball on wall   Shoulder Exercises: Stretch   Internal Rotation Stretch Limitations green strap hand behind back   Other Shoulder Stretches self inferior mob with towel, elbow on wall   Moist Heat Therapy   Number Minutes Moist Heat 10 Minutes   Moist Heat Location Shoulder   Manual Therapy   Joint Mobilization end range flexion & abd inf mobs                PT Education - 12/07/15 0848    Education provided Yes   Education Details exercise form/rationale; progression of adhesive capsulitis   Person(s) Educated Patient   Methods Explanation;Demonstration;Tactile cues;Verbal cues   Comprehension Verbalized understanding;Returned demonstration;Verbal cues required;Tactile cues required;Need further instruction  PT Long Term Goals - 12/04/15 0836    PT LONG TERM GOAL #1   Title Patient improves left shoulder AROM to within 75% of normal ROM for non-dominant shoulder. (Target Date: 12/13/2015)   Status On-going   PT LONG TERM GOAL #2   Title Patient reports pain in left shoulder </= 3/10 with AROM.     Status On-going   PT LONG TERM GOAL #3   Title Pt able to lift 10# tray simulating job tasks with proper mechanics independently.    Status Achieved   PT LONG TERM GOAL #4   Title Patient demonstrates understanding of ongoing HEP for left shoulder ROM & strength and upper body posture.     Status  On-going               Plan - 12/07/15 0849    Clinical Impression Statement Significant decrease in pain today following injection yesterday. Pt demo improved control of shoulder joint but still requiring some cuing for scapular retraction and to decrease use of upper trap. Added 1lb weight to [facility] reaches in functional movements which resulted in fatigue but no significant increase in pain.    Consulted and Agree with Plan of Care Patient      Patient will benefit from skilled therapeutic intervention in order to improve the following deficits and impairments:     Visit Diagnosis: Pain in left shoulder  Muscle weakness (generalized)  Stiffness of left shoulder, not elsewhere classified     Problem List Patient Active Problem List   Diagnosis Date Noted  . Distal radius fracture, left 06/21/2015  . Hyperlipidemia 06/16/2014  . Essential hypertension 06/16/2014  . Diabetes mellitus type 2, insulin dependent ([place]) 03/24/2014   Venice Marcucci C. Shacola Schussler PT, DPT 12/07/2015 8:55 AM   [facility] [address] [place], [place], [address] Phone: [phone]   Fax:  [phone]  Name: CLARKIE CRUSON MRN: [id number] Date of Birth: [birth year]

## 2015-12-14 ENCOUNTER — Ambulatory Visit: Payer: PRIVATE HEALTH INSURANCE | Attending: Orthopedic Surgery | Admitting: Physical Therapy

## 2015-12-14 DIAGNOSIS — M25512 Pain in left shoulder: Secondary | ICD-10-CM | POA: Diagnosis not present

## 2015-12-14 DIAGNOSIS — M25632 Stiffness of left wrist, not elsewhere classified: Secondary | ICD-10-CM | POA: Insufficient documentation

## 2015-12-14 DIAGNOSIS — M25612 Stiffness of left shoulder, not elsewhere classified: Secondary | ICD-10-CM | POA: Diagnosis present

## 2015-12-14 DIAGNOSIS — M6281 Muscle weakness (generalized): Secondary | ICD-10-CM | POA: Diagnosis present

## 2015-12-14 NOTE — Patient Instructions (Signed)
Shoulder Press: Supine with Protraction   USE 2# WEIGHT  Tubing around back, anchored in extended arms, push fists further toward ceiling from shoulder blades. Repeat _10_ times per set. Do 2__ sets per session. Do 2__ sessions per week.  Abduction (Eccentric) - Side-Lying    Lie on side, affected arm on top. Lift arm overhead. Slowly lower for 3-5 seconds. _10__ reps per set, _3__ sets per session, _7__ days per week. Add _2-3__ lbs when you achieve __20_ repetitions.

## 2015-12-14 NOTE — Therapy (Signed)
[place], [place], [address] Phone: [phone]   Fax:  [phone]  Physical Therapy Treatment  Patient Details  Name: Brandy Burns MRN: [id number] Date of Birth: [birth year] Referring Provider: Iran Planas, MD  Encounter Date: 12/14/2015      PT End of Session - 12/14/15 0808    Visit Number 18   Number of Visits 28   Date for PT Re-Evaluation 01/12/16   Authorization Type Worker's Compensation 28 total visits approved   PT Start Time 0800   PT Stop Time 0845   PT Time Calculation (min) 45 min      Past Medical History  Diagnosis Date  . GERD (gastroesophageal reflux disease)   . Hyperlipemia   . Essential hypertension 06/16/2014  . Asthma   . Diabetes mellitus     type 2  . Kidney stones   . Anemia     as a child    Past Surgical History  Procedure Laterality Date  . Tonsillectomy    . Tubal ligation    . Ectopic pregnancy surgery    . Colonoscopy  09/23/2011    Procedure: COLONOSCOPY;  Surgeon: Juanita Craver, MD;  Location: [facility];  Service: Endoscopy;  Laterality: N/A;  . Open reduction internal fixation (orif) distal radial fracture Left 06/21/2015    Procedure: OPEN REDUCTION INTERNAL FIXATION (ORIF) DISTAL RADIAL FRACTURE AND REPAIR AS NEEDED;  Surgeon: Iran Planas, MD;  Location: [facility];  Service: Orthopedics;  Laterality: Left;    There were no vitals filed for this visit.      Subjective Assessment - 12/14/15 0806    Subjective Better with activity. 5/10 with IR    Currently in Pain? Yes   Pain Score 3    Pain Location Shoulder   Pain Orientation Left   Aggravating Factors  heavy lifting, reaching behind    Pain Relieving Factors rest, meds            [facility] PT Assessment - 12/14/15 0001    AROM   Left Shoulder Flexion 120 Degrees   Left Shoulder Internal Rotation --  reach to left glute, reach to sacrum after tx   PROM   Left Shoulder Flexion 145 Degrees                      [facility] Adult PT Treatment/Exercise - 12/14/15 0001    Shoulder Exercises: Supine   Horizontal ABduction 20 reps   Theraband Level (Shoulder Horizontal ABduction) Level 3 (Green)   Other Supine Exercises sash, and pullovers with green band x10 each, supine bicep curls 3# x    Other Supine Exercises supine clocks 3# 12 to 6 and 9 to 3 in small rom with 3#, supine punch with protraction 3# x 15   Shoulder Exercises: Sidelying   ABduction 20 reps;Weights   ABduction Weight (lbs) 2   Shoulder Exercises: Standing   Horizontal ABduction 15 reps;Theraband;Both  cues to depress scapula   Theraband Level (Shoulder Horizontal ABduction) Level 3 (Green)   External Rotation 20 reps;Theraband   Theraband Level (Shoulder External Rotation) Level 3 (Green)   Internal Rotation 20 reps;Theraband   Theraband Level (Shoulder Internal Rotation) Level 3 (Green)   Flexion Limitations shelf reach 3x10 2lb  also in scaption 1# 10, 2# 10x2   Extension Strengthening;Both;20 reps   Theraband Level (Shoulder Extension) Level 3 (Green)   Row Strengthening;Both;20 reps   Theraband Level (Shoulder Row) Level 3 (Green)  Shoulder Exercises: ROM/Strengthening   UBE (Upper Arm Bike) 3/3 min L 2.5   Other ROM/Strengthening Exercises Wall slides bilateral with 10 sec hold x 10  AAROM 130 flexion   Other ROM/Strengthening Exercises UE ranger on floor for IR reaching, then cane AAROM extension, narrow IR, wide IR-then towel stretch for IR 3 x 30 sec -  reach improved from left glute to L5                PT Education - 12/14/15 0910    Education provided Yes   Education Details Review of HEP and addition of green band   Person(s) Educated Patient   Methods Explanation;Handout   Comprehension Verbalized understanding             PT Long Term Goals - 12/04/15 0836    PT LONG TERM GOAL #1   Title Patient improves left shoulder AROM to within 75% of normal ROM for  non-dominant shoulder. (Target Date: 12/13/2015)   Status On-going   PT LONG TERM GOAL #2   Title Patient reports pain in left shoulder </= 3/10 with AROM.     Status On-going   PT LONG TERM GOAL #3   Title Pt able to lift 10# tray simulating job tasks with proper mechanics independently.    Status Achieved   PT LONG TERM GOAL #4   Title Patient demonstrates understanding of ongoing HEP for left shoulder ROM & strength and upper body posture.     Status On-going               Plan - 12/14/15 0911    Clinical Impression Statement Pt reports 3/10 with most AROM except IR reaching which is 5/10. Progressing toward LTG#2. Able to increase IR reaching from left glute to L-5 after AAROM and stretching however IR returned to initial measurement by end of treatment. Reviewed pt's current HEP and gave pt green band.    PT Next Visit Plan Continue strength ROM , functional activity, review stretches   PT Home Exercise Plan wall slides, AROM flx and scaption in mirror, contrast bath for wrist, HBB stretch with strap, table slides, ER and IR stretches at doorway and behind back, sleeper stretch, supine scap stab series, green band      Patient will benefit from skilled therapeutic intervention in order to improve the following deficits and impairments:  Decreased activity tolerance, Decreased range of motion, Decreased strength, Increased muscle spasms, Impaired flexibility, Improper body mechanics, Postural dysfunction, Pain  Visit Diagnosis: Pain in left shoulder  Muscle weakness (generalized)  Stiffness of left shoulder, not elsewhere classified     Problem List Patient Active Problem List   Diagnosis Date Noted  . Distal radius fracture, left 06/21/2015  . Hyperlipidemia 06/16/2014  . Essential hypertension 06/16/2014  . Diabetes mellitus type 2, insulin dependent ([place]) 03/24/2014    [name], PTA 12/14/2015, 9:16 AM  [facility] [address] [place], [place], [address] Phone: [phone]   Fax:  [phone]  Name: Brandy Burns MRN: [id number] Date of Birth: 12-18-1960

## 2015-12-18 ENCOUNTER — Encounter: Payer: Self-pay | Admitting: Physical Therapy

## 2015-12-18 ENCOUNTER — Ambulatory Visit: Payer: PRIVATE HEALTH INSURANCE | Admitting: Physical Therapy

## 2015-12-18 DIAGNOSIS — M25512 Pain in left shoulder: Secondary | ICD-10-CM | POA: Diagnosis not present

## 2015-12-18 DIAGNOSIS — M6281 Muscle weakness (generalized): Secondary | ICD-10-CM

## 2015-12-18 NOTE — Therapy (Signed)
[place], [place], [address] Phone: [phone]   Fax:  [phone]  Physical Therapy Treatment  Patient Details  Name: Brandy Burns MRN: [id number] Date of Birth: May 06, 1961 Referring Provider: Iran Planas, MD  Encounter Date: 12/18/2015      PT End of Session - 12/18/15 0758    Visit Number 19   Number of Visits 28   Date for PT Re-Evaluation 01/12/16   Authorization Type Worker's Compensation 28 total visits approved   PT Start Time 0800   PT Stop Time 0842   PT Time Calculation (min) 42 min   Activity Tolerance Patient tolerated treatment well;Patient limited by fatigue   Behavior During Therapy [facility] for tasks assessed/performed      Past Medical History  Diagnosis Date  . GERD (gastroesophageal reflux disease)   . Hyperlipemia   . Essential hypertension 06/16/2014  . Asthma   . Diabetes mellitus     type 2  . Kidney stones   . Anemia     as a child    Past Surgical History  Procedure Laterality Date  . Tonsillectomy    . Tubal ligation    . Ectopic pregnancy surgery    . Colonoscopy  09/23/2011    Procedure: COLONOSCOPY;  Surgeon: Juanita Craver, MD;  Location: [facility];  Service: Endoscopy;  Laterality: N/A;  . Open reduction internal fixation (orif) distal radial fracture Left 06/21/2015    Procedure: OPEN REDUCTION INTERNAL FIXATION (ORIF) DISTAL RADIAL FRACTURE AND REPAIR AS NEEDED;  Surgeon: Iran Planas, MD;  Location: [facility];  Service: Orthopedics;  Laterality: Left;    There were no vitals filed for this visit.      Subjective Assessment - 12/18/15 0759    Subjective Pt reports shoulder is doing okay. continued difficulty with reaching behind back, grabbing objects from overhead and lowering them down and lifting activities.    Currently in Pain? Yes   Pain Score 2    Pain Location Shoulder   Pain Orientation Left   Pain Descriptors / Indicators Sore   Aggravating  Factors  reaching behind back, unable to reach all the way up            [facility] PT Assessment - 12/18/15 0001    AROM   Right/Left Shoulder Right   Right Shoulder Flexion 156 Degrees   Right Shoulder ABduction 160 Degrees   Right Shoulder Internal Rotation --  L1   Right Shoulder External Rotation --  t3   Left Shoulder Flexion 139 Degrees   Left Shoulder ABduction 140 Degrees   Left Shoulder Internal Rotation --  to S1, unable to reach midline   Left Shoulder External Rotation --  to C6                     [facility] Adult PT Treatment/Exercise - 12/18/15 0001    Shoulder Exercises: Standing   Theraband Level (Shoulder Horizontal ABduction) Level 1 (Yellow)   Horizontal ABduction Limitations 3 min 3 way   External Rotation 20 reps;10 reps   Theraband Level (Shoulder External Rotation) Level 1 (Yellow)   Theraband Level (Shoulder Flexion) Level 1 (Yellow)   Flexion Limitations 3x10 reach resisted by Ytband   Other Standing Exercises deltoid series x15 ea   Shoulder Exercises: ROM/Strengthening   UBE (Upper Arm Bike) 3/3 min L 1   Other ROM/Strengthening Exercises wall slide ABCs x2   Shoulder Exercises: Stretch   Internal Rotation Stretch  Limitations green strap hand behind back 3x30s   Other Shoulder Stretches sleeper stretch 3x10s                PT Education - 12/18/15 0843    Education provided Yes   Education Details exercise form/rationale, progression of goals   Person(s) Educated Patient   Methods Explanation;Demonstration;Tactile cues;Verbal cues   Comprehension Verbalized understanding;Returned demonstration;Verbal cues required;Tactile cues required;Need further instruction             PT Long Term Goals - 12/18/15 7944    PT LONG TERM GOAL #1   Title Patient improves left shoulder AROM to within 75% of normal ROM for non-dominant shoulder (target date 8/10)   Baseline met for flexion and abduction, lacking in rotational motions    Status Partially Met   PT LONG TERM GOAL #2   Title Patient reports pain in left shoulder </= 3/10 with AROM.     Baseline 3-4/10   Status On-going   PT LONG TERM GOAL #3   Title Pt able to lift 10# tray simulating job tasks with proper mechanics independently.    Status Achieved   PT LONG TERM GOAL #4   Title Patient demonstrates understanding of ongoing HEP for left shoulder ROM & strength and upper body posture.     Baseline is doing HEP but continues to protect arm, normalized use requries VC   Status On-going   PT LONG TERM GOAL #5   Title Pt will be able to reach up to grab plates and other kitchen materials from overhead shelves with minimal difficulty   Baseline mod difficulty on 7/10 with increased pain   Time 4   Period Weeks   Status New               Plan - 12/18/15 0844    Clinical Impression Statement Challenged shoulder endurance with lighter band and higher repetitions today. Pt is making good progress toward goals but continues to lack endurance that is required for safe use of arm through a full day of work and ADLs. Will continue to benefit from skilled PT to continue progressing toward LTG and return to full ability at work.    PT Next Visit Plan Continue strength ROM , functional activity, review stretches, shoulder endurance   PT Home Exercise Plan wall slides, AROM flx and scaption in mirror, contrast bath for wrist, HBB stretch with strap, table slides, ER and IR stretches at doorway and behind back, sleeper stretch, supine scap stab series, green band   Consulted and Agree with Plan of Care Patient      Patient will benefit from skilled therapeutic intervention in order to improve the following deficits and impairments:     Visit Diagnosis: Pain in left shoulder  Muscle weakness (generalized)     Problem List Patient Active Problem List   Diagnosis Date Noted  . Distal radius fracture, left 06/21/2015  . Hyperlipidemia 06/16/2014  .  Essential hypertension 06/16/2014  . Diabetes mellitus type 2, insulin dependent ([place]) 03/24/2014   Zailen Albarran C. Cordera Stineman PT, DPT 12/18/2015 9:51 AM   [facility] [address] [place], [place], [address] Phone: [phone]   Fax:  [phone]  Name: NAVIL KOLE MRN: [id number] Date of Birth: 08-06-1960

## 2015-12-20 ENCOUNTER — Ambulatory Visit: Payer: PRIVATE HEALTH INSURANCE | Admitting: Physical Therapy

## 2015-12-20 DIAGNOSIS — M25612 Stiffness of left shoulder, not elsewhere classified: Secondary | ICD-10-CM

## 2015-12-20 DIAGNOSIS — M6281 Muscle weakness (generalized): Secondary | ICD-10-CM

## 2015-12-20 DIAGNOSIS — M25632 Stiffness of left wrist, not elsewhere classified: Secondary | ICD-10-CM

## 2015-12-20 DIAGNOSIS — M25512 Pain in left shoulder: Secondary | ICD-10-CM

## 2015-12-20 NOTE — Therapy (Signed)
[place], [place], [address] Phone: [phone]   Fax:  [phone]  Physical Therapy Treatment  Patient Details  Name: Brandy Burns MRN: [id number] Date of Birth: 04-07-1961 Referring Provider: Iran Planas, MD  Encounter Date: 12/20/2015      PT End of Session - 12/20/15 1430    Visit Number 20   Number of Visits 28   Date for PT Re-Evaluation 01/12/16   Authorization Type Worker's Compensation 28 total visits approved   PT Start Time 0845   PT Stop Time 0930   PT Time Calculation (min) 45 min      Past Medical History  Diagnosis Date  . GERD (gastroesophageal reflux disease)   . Hyperlipemia   . Essential hypertension 06/16/2014  . Asthma   . Diabetes mellitus     type 2  . Kidney stones   . Anemia     as a child    Past Surgical History  Procedure Laterality Date  . Tonsillectomy    . Tubal ligation    . Ectopic pregnancy surgery    . Colonoscopy  09/23/2011    Procedure: COLONOSCOPY;  Surgeon: Juanita Craver, MD;  Location: [facility];  Service: Endoscopy;  Laterality: N/A;  . Open reduction internal fixation (orif) distal radial fracture Left 06/21/2015    Procedure: OPEN REDUCTION INTERNAL FIXATION (ORIF) DISTAL RADIAL FRACTURE AND REPAIR AS NEEDED;  Surgeon: Iran Planas, MD;  Location: [facility];  Service: Orthopedics;  Laterality: Left;    There were no vitals filed for this visit.      Subjective Assessment - 12/20/15 0849    Currently in Pain? Yes   Pain Score 3    Pain Orientation Left            [facility] PT Assessment - 12/20/15 0001    AROM   Left Shoulder Internal Rotation --  sacrum, then L5 after AAROM and PROM                     [facility] Adult PT Treatment/Exercise - 12/20/15 0001    Shoulder Exercises: Supine   Horizontal ABduction 20 reps   Theraband Level (Shoulder Horizontal ABduction) Level 3 (Green)   Other Supine Exercises supine clocks 3#  12 to 6 and 9 to 3 in small rom with 3#, supine punch with protraction 3# x 15   Shoulder Exercises: Standing   External Rotation 20 reps;10 reps   Theraband Level (Shoulder External Rotation) Level 3 (Green)   Internal Rotation 20 reps;Theraband   Theraband Level (Shoulder Internal Rotation) Level 3 (Green)   Flexion 10 reps  2 sets   Theraband Level (Shoulder Flexion) Level 3 (Green)  x 20 punch   Shoulder Flexion Weight (lbs) 2   ABduction 10 reps  2 sets   Shoulder ABduction Weight (lbs) 2   Extension Strengthening;Both;20 reps   Theraband Level (Shoulder Extension) Level 3 (Green)   Row Strengthening;Both;20 reps   Theraband Level (Shoulder Row) Level 3 (Green)   Shoulder Exercises: ROM/Strengthening   UBE (Upper Arm Bike) 3/3 min L 2.5   Other ROM/Strengthening Exercises wall slide ABCs x2   Other ROM/Strengthening Exercises UE ranger IR 15 x3, followed by IR stretching improved AROM from reach to sacrum to reach to L-5  cabinet reaching middle shelf 1# x20,2# x 20  flex and abdct   Shoulder Exercises: Stretch   Corner Stretch Limitations 1 UE 3 x 30 sec in doorway  Internal Rotation Stretch 3 reps  30 sec   Internal Rotation Stretch Limitations self stretch                      PT Long Term Goals - 12/18/15 0807    PT LONG TERM GOAL #1   Title Patient improves left shoulder AROM to within 75% of normal ROM for non-dominant shoulder (target date 8/10)   Baseline met for flexion and abduction, lacking in rotational motions   Status Partially Met   PT LONG TERM GOAL #2   Title Patient reports pain in left shoulder </= 3/10 with AROM.     Baseline 3-4/10   Status On-going   PT LONG TERM GOAL #3   Title Pt able to lift 10# tray simulating job tasks with proper mechanics independently.    Status Achieved   PT LONG TERM GOAL #4   Title Patient demonstrates understanding of ongoing HEP for left shoulder ROM & strength and upper body posture.     Baseline is  doing HEP but continues to protect arm, normalized use requries VC   Status On-going   PT LONG TERM GOAL #5   Title Pt will be able to reach up to grab plates and other kitchen materials from overhead shelves with minimal difficulty   Baseline mod difficulty on 7/10 with increased pain   Time 4   Period Weeks   Status New               Plan - 12/20/15 1430    Clinical Impression Statement Continued shoulder and scap endurance with good tolerance. Progressing toward cabinet reaching goal. IR slightly improved.    PT Next Visit Plan Continue strength ROM , functional activity, review stretches, shoulder endurance      Patient will benefit from skilled therapeutic intervention in order to improve the following deficits and impairments:  Decreased activity tolerance, Decreased range of motion, Decreased strength, Increased muscle spasms, Impaired flexibility, Improper body mechanics, Postural dysfunction, Pain  Visit Diagnosis: Pain in left shoulder  Muscle weakness (generalized)  Stiffness of left shoulder, not elsewhere classified  Stiffness of left wrist, not elsewhere classified     Problem List Patient Active Problem List   Diagnosis Date Noted  . Distal radius fracture, left 06/21/2015  . Hyperlipidemia 06/16/2014  . Essential hypertension 06/16/2014  . Diabetes mellitus type 2, insulin dependent ([place]) 03/24/2014    [name], PTA 12/20/2015, 2:32 PM  [facility] [address] [place], [place], [address] Phone: [phone]   Fax:  [phone]  Name: Brandy Burns MRN: [id number] Date of Birth: Sep 15, 1960

## 2015-12-25 ENCOUNTER — Ambulatory Visit: Payer: PRIVATE HEALTH INSURANCE | Admitting: Physical Therapy

## 2015-12-25 ENCOUNTER — Encounter: Payer: Self-pay | Admitting: Physical Therapy

## 2015-12-25 DIAGNOSIS — M25612 Stiffness of left shoulder, not elsewhere classified: Secondary | ICD-10-CM

## 2015-12-25 DIAGNOSIS — M25512 Pain in left shoulder: Secondary | ICD-10-CM | POA: Diagnosis not present

## 2015-12-25 DIAGNOSIS — M25632 Stiffness of left wrist, not elsewhere classified: Secondary | ICD-10-CM

## 2015-12-25 DIAGNOSIS — M6281 Muscle weakness (generalized): Secondary | ICD-10-CM

## 2015-12-25 NOTE — Therapy (Signed)
[place], [place], [address] Phone: [phone]   Fax:  [phone]  Physical Therapy Treatment  Patient Details  Name: Brandy Burns MRN: [id number] Date of Birth: [birth year] Referring Provider: Iran Planas, MD  Encounter Date: 12/25/2015      PT End of Session - 12/25/15 1011    Visit Number 21   Number of Visits 28   Date for PT Re-Evaluation 01/12/16   Authorization Type Worker's Compensation 28 total visits approved   PT Start Time 0930   PT Stop Time 1011   PT Time Calculation (min) 41 min   Activity Tolerance Patient tolerated treatment well   Behavior During Therapy [facility] for tasks assessed/performed      Past Medical History  Diagnosis Date  . GERD (gastroesophageal reflux disease)   . Hyperlipemia   . Essential hypertension 06/16/2014  . Asthma   . Diabetes mellitus     type 2  . Kidney stones   . Anemia     as a child    Past Surgical History  Procedure Laterality Date  . Tonsillectomy    . Tubal ligation    . Ectopic pregnancy surgery    . Colonoscopy  09/23/2011    Procedure: COLONOSCOPY;  Surgeon: Juanita Craver, MD;  Location: [facility];  Service: Endoscopy;  Laterality: N/A;  . Open reduction internal fixation (orif) distal radial fracture Left 06/21/2015    Procedure: OPEN REDUCTION INTERNAL FIXATION (ORIF) DISTAL RADIAL FRACTURE AND REPAIR AS NEEDED;  Surgeon: Iran Planas, MD;  Location: [facility];  Service: Orthopedics;  Laterality: Left;    There were no vitals filed for this visit.      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    Currently in Pain? Yes   Pain Score 4    Pain Location Shoulder   Pain Orientation Left   Pain Descriptors / Indicators Sore   Pain Type  Chronic pain   Aggravating Factors  reaching behind back, lifting    Pain Relieving Factors rest                         [facility] Adult PT Treatment/Exercise - 12/25/15 0001    Shoulder Exercises: Supine   Horizontal ABduction 20 reps   Theraband Level (Shoulder Horizontal ABduction) Level 3 (Green)   Horizontal ABduction Limitations paired with flexion   Other Supine Exercises serratus press 3# x25   Shoulder Exercises: Standing   Flexion 15 reps   Shoulder Flexion Weight (lbs) 3   Flexion Limitations biceps curl to flex 90 deg   ABduction 20 reps   Shoulder ABduction Weight (lbs) 3   ABduction Limitations elbows bent and palms into body   Row 20 reps;10 reps   Theraband Level (Shoulder Row) Level 3 (Green)   Other Standing Exercises arm clock on physioball at table   Other Standing Exercises biceps curls 3#- cues for scap retraction and GHJ ADD   Shoulder Exercises: Pulleys   Flexion 3 minutes   Shoulder Exercises: ROM/Strengthening   UBE (Upper Arm Bike) 3/3 min L 2.5   Shoulder Exercises: Stretch   Internal Rotation Stretch 30 seconds   Internal Rotation Stretch Limitations self stretch    Manual  Therapy   Joint Mobilization gr 3 and 4 inf mob at end range flx and abd   Passive ROM all shoulder motions                PT Education - 12/25/15 0933    Education provided Yes   Education Details exercise form/rationale   Person(s) Educated Patient   Methods Explanation;Demonstration;Tactile cues;Verbal cues   Comprehension Verbalized understanding;Returned demonstration;Verbal cues required;Tactile cues required;Need further instruction             PT Long Term Goals - 12/18/15 6834    PT LONG TERM GOAL #1   Title Patient improves left shoulder AROM to within 75% of normal ROM for non-dominant shoulder (target date 8/10)   Baseline met for flexion and abduction, lacking in rotational motions   Status Partially Met   PT LONG TERM GOAL #2    Title Patient reports pain in left shoulder </= 3/10 with AROM.     Baseline 3-4/10   Status On-going   PT LONG TERM GOAL #3   Title Pt able to lift 10# tray simulating job tasks with proper mechanics independently.    Status Achieved   PT LONG TERM GOAL #4   Title Patient demonstrates understanding of ongoing HEP for left shoulder ROM & strength and upper body posture.     Baseline is doing HEP but continues to protect arm, normalized use requries VC   Status On-going   PT LONG TERM GOAL #5   Title Pt will be able to reach up to grab plates and other kitchen materials from overhead shelves with minimal difficulty   Baseline mod difficulty on 7/10 with increased pain   Time 4   Period Weeks   Status New               Plan - 12/25/15 1012    Clinical Impression Statement Pt demo good improvement in functional ROM but continues to lack appropriate strength and neuromuscular control to avoid detrimental compensatory patterns such as shoulder hike that creates impingement. Pt will cont to benefit from skilled PT to train strength and contorol in higher ranges and during functional activities.    PT Next Visit Plan Continue strength ROM , functional activity, review stretches, shoulder endurance   PT Home Exercise Plan wall slides, AROM flx and scaption in mirror, contrast bath for wrist, HBB stretch with strap, table slides, ER and IR stretches at doorway and behind back, sleeper stretch, supine scap stab series, green band   Consulted and Agree with Plan of Care Patient      Patient will benefit from skilled therapeutic intervention in order to improve the following deficits and impairments:     Visit Diagnosis: Pain in left shoulder  Muscle weakness (generalized)  Stiffness of left shoulder, not elsewhere classified  Stiffness of left wrist, not elsewhere classified     Problem List Patient Active Problem List   Diagnosis Date Noted  . Distal radius fracture, left  06/21/2015  . Hyperlipidemia 06/16/2014  . Essential hypertension 06/16/2014  . Diabetes mellitus type 2, insulin dependent ([place]) 03/24/2014    [name] PT, DPT 12/25/2015 11:46 AM   [facility] [address] [place], [place], [address] Phone: [phone]   Fax:  [phone]  Name: Brandy Burns MRN: [id number] Date of Birth: 1960/06/26

## 2015-12-27 ENCOUNTER — Encounter: Payer: Self-pay | Admitting: Physical Therapy

## 2015-12-27 ENCOUNTER — Ambulatory Visit: Payer: PRIVATE HEALTH INSURANCE | Admitting: Physical Therapy

## 2015-12-27 DIAGNOSIS — M25512 Pain in left shoulder: Secondary | ICD-10-CM

## 2015-12-27 DIAGNOSIS — M25612 Stiffness of left shoulder, not elsewhere classified: Secondary | ICD-10-CM

## 2015-12-27 DIAGNOSIS — M6281 Muscle weakness (generalized): Secondary | ICD-10-CM

## 2015-12-27 NOTE — Patient Instructions (Signed)
Access Code: [email or web address]  URL: [email or web address]  Date: 12/27/2015  Prepared by: Selinda Eon   Exercises  Prone Shoulder Extension - 1x daily - 7x weekly  Prone Shoulder Horizontal Abduction with Internal Rotation - 1x daily - 7x weekly  Prone Scapular Protraction Retraction AROM on Forearms - 1x daily - 7x weekly   (Perform all of the above for 3 mintues) Supine ABCs (written by PT on handout)

## 2015-12-27 NOTE — Therapy (Signed)
[place], [place], [address] Phone: [phone]   Fax:  [phone]  Physical Therapy Treatment  Patient Details  Name: Brandy Burns MRN: [id number] Date of Birth: 06/04/1961 Referring Provider: Iran Planas, MD  Encounter Date: 12/27/2015      PT End of Session - 12/27/15 0841    Visit Number 22   Number of Visits 28   Date for PT Re-Evaluation 01/12/16   Authorization Type Worker's Compensation 28 total visits approved   PT Start Time 0845   PT Stop Time 0927   PT Time Calculation (min) 42 min   Activity Tolerance Patient tolerated treatment well   Behavior During Therapy [facility] for tasks assessed/performed      Past Medical History  Diagnosis Date  . GERD (gastroesophageal reflux disease)   . Hyperlipemia   . Essential hypertension 06/16/2014  . Asthma   . Diabetes mellitus     type 2  . Kidney stones   . Anemia     as a child    Past Surgical History  Procedure Laterality Date  . Tonsillectomy    . Tubal ligation    . Ectopic pregnancy surgery    . Colonoscopy  09/23/2011    Procedure: COLONOSCOPY;  Surgeon: Juanita Craver, MD;  [facility];  Service: Endoscopy;  Laterality: N/A;  . Open reduction internal fixation (orif) distal radial fracture Left 06/21/2015    Procedure: OPEN REDUCTION INTERNAL FIXATION (ORIF) DISTAL RADIAL FRACTURE AND REPAIR AS NEEDED;  Surgeon: Iran Planas, MD;  Location: [place];  Service: Orthopedics;  Laterality: Left;    There were no vitals filed for this visit.      Subjective Assessment - 12/27/15 0842    Subjective pt reports her shoulder was very sore after returning to work. Felt exhausted after the last two days. 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    Currently in Pain? Yes   Pain Score 4    Pain Location Shoulder   Pain Orientation Left   Pain Descriptors / Indicators Sore  a litttle stiff   Pain Type Chronic pain   Aggravating Factors  reaching, lifting            [facility] PT Assessment - 12/27/15 0001    Observation/Other Assessments   Other Surveys  Other Surveys   Quick DASH  40% disability                     [facility] Adult PT Treatment/Exercise - 12/27/15 0001    Shoulder Exercises: Supine   Other Supine Exercises Large ABCs with green plyoball x2   Shoulder Exercises: Prone   Extension 20 reps;10 reps  retraction with bilateral UE extension   Horizontal ABduction 1 20 reps;10 reps   Horizontal ABduction 1 Limitations GHJ IR   Other Prone Exercises prone on elbows serratus press x20   Shoulder Exercises: Standing   Horizontal ABduction 15 reps   Theraband Level (Shoulder Horizontal ABduction) Level 1 (Yellow)   Horizontal ABduction Limitations x-pulls   Flexion 20 reps;10 reps   Theraband Level (Shoulder Flexion) Level 1 (Yellow)   Flexion Limitations resisted reach to high cabinet   ABduction 20 reps;12 reps   Shoulder ABduction Weight (lbs) 5  ABduction Limitations elbows wide, bilat hands grab kettle bell   Other Standing Exercises hands behind back abduction with pulses, YTB   Shoulder Exercises: Pulleys   Flexion 3 minutes   Shoulder Exercises: ROM/Strengthening   UBE (Upper Arm Bike) 3/3 min L 1.5                PT Education - 12/27/15 0850    Education provided Yes   Education Details exercise form/rationale, building endurance for activities   Person(s) Educated Patient   Methods Explanation;Demonstration;Tactile cues;Verbal cues   Comprehension Verbalized understanding;Returned demonstration;Verbal cues required;Tactile cues required;Need further instruction             PT Long Term Goals - 12/18/15 5597    PT LONG TERM GOAL #1   Title Patient improves left shoulder  AROM to within 75% of normal ROM for non-dominant shoulder (target date 8/10)   Baseline met for flexion and abduction, lacking in rotational motions   Status Partially Met   PT LONG TERM GOAL #2   Title Patient reports pain in left shoulder </= 3/10 with AROM.     Baseline 3-4/10   Status On-going   PT LONG TERM GOAL #3   Title Pt able to lift 10# tray simulating job tasks with proper mechanics independently.    Status Achieved   PT LONG TERM GOAL #4   Title Patient demonstrates understanding of ongoing HEP for left shoulder ROM & strength and upper body posture.     Baseline is doing HEP but continues to protect arm, normalized use requries VC   Status On-going   PT LONG TERM GOAL #5   Title Pt will be able to reach up to grab plates and other kitchen materials from overhead shelves with minimal difficulty   Baseline mod difficulty on 7/10 with increased pain   Time 4   Period Weeks   Status New               Plan - 12/27/15 0901    Clinical Impression Statement Moving toward focus on strengthening and endurance challenges so pt can avoid utilizing painful compensation patterns at work. Significant difficulty and frequent cuing to avoid shoulder hike required.    PT Next Visit Plan Continue strength ROM , functional activity, review stretches, shoulder endurance   Consulted and Agree with Plan of Care Patient      Patient will benefit from skilled therapeutic intervention in order to improve the following deficits and impairments:     Visit Diagnosis: Pain in left shoulder  Muscle weakness (generalized)  Stiffness of left shoulder, not elsewhere classified     Problem List Patient Active Problem List   Diagnosis Date Noted  . Distal radius fracture, left 06/21/2015  . Hyperlipidemia 06/16/2014  . Essential hypertension 06/16/2014  . Diabetes mellitus type 2, insulin dependent ([place]) 03/24/2014    Emberleigh Reily C. Baylen Dea PT, DPT 12/27/2015 10:12 AM   [facility] [address] [place], [place], [address] Phone: [phone]   Fax:  [phone]  Name: Brandy Burns MRN: [id number] Date of Birth: 05-20-1961

## 2016-01-01 ENCOUNTER — Ambulatory Visit: Payer: PRIVATE HEALTH INSURANCE | Admitting: Physical Therapy

## 2016-01-01 ENCOUNTER — Encounter: Payer: Self-pay | Admitting: Physical Therapy

## 2016-01-01 DIAGNOSIS — M25612 Stiffness of left shoulder, not elsewhere classified: Secondary | ICD-10-CM

## 2016-01-01 DIAGNOSIS — M6281 Muscle weakness (generalized): Secondary | ICD-10-CM

## 2016-01-01 DIAGNOSIS — M25512 Pain in left shoulder: Secondary | ICD-10-CM | POA: Diagnosis not present

## 2016-01-01 DIAGNOSIS — M25632 Stiffness of left wrist, not elsewhere classified: Secondary | ICD-10-CM

## 2016-01-01 NOTE — Therapy (Signed)
[place], [place], [address] Phone: [phone]   Fax:  [phone]  Physical Therapy Treatment  Patient Details  Name: Brandy Burns MRN: [id number] Date of Birth: Oct 06, 1960 Referring Provider: Iran Planas, MD  Encounter Date: 01/01/2016      PT End of Session - 01/01/16 0857    Visit Number 23   Number of Visits 28   Date for PT Re-Evaluation 01/12/16   Authorization Type Worker's Compensation 28 total visits approved   PT Start Time 0847   PT Stop Time 0926   PT Time Calculation (min) 39 min   Activity Tolerance Patient tolerated treatment well   Behavior During Therapy [facility] for tasks assessed/performed      Past Medical History:  Diagnosis Date  . Anemia    as a child  . Asthma   . Diabetes mellitus    type 2  . Essential hypertension 06/16/2014  . GERD (gastroesophageal reflux disease)   . Hyperlipemia   . Kidney stones     Past Surgical History:  Procedure Laterality Date  . COLONOSCOPY  09/23/2011   Procedure: COLONOSCOPY;  Surgeon: Juanita Craver, MD;  [facility];  Service: Endoscopy;  Laterality: N/A;  . ECTOPIC PREGNANCY SURGERY    . OPEN REDUCTION INTERNAL FIXATION (ORIF) DISTAL RADIAL FRACTURE Left 06/21/2015   Procedure: OPEN REDUCTION INTERNAL FIXATION (ORIF) DISTAL RADIAL FRACTURE AND REPAIR AS NEEDED;  Surgeon: Iran Planas, MD;  Location: [place];  Service: Orthopedics;  Laterality: Left;  . TONSILLECTOMY    . TUBAL LIGATION      There were no vitals filed for this visit.      Subjective Assessment - 01/01/16 0852    Subjective Pt reports shoulder is sore but is doing okay, hand is bothering her this morning. rough weekend with husband acquiring multiple injuries.  Still having difficulty lifting trays at work.    Currently in Pain? Yes   Pain Score 4    Pain Location Shoulder   Pain Orientation Left   Pain Descriptors / Indicators Sore   Pain Type Chronic  pain   Pain Score 5   Pain Location Wrist   Pain Orientation Left   Pain Descriptors / Indicators Aching                         [facility] Adult PT Treatment/Exercise - 01/01/16 0001      Shoulder Exercises: Supine   Protraction Limitations PT attempts to pull into protraction, pt resists   Horizontal ABduction 20 reps   Theraband Level (Shoulder Horizontal ABduction) Level 2 (Red)   Horizontal ABduction Limitations paired with flexion   Other Supine Exercises supine L overhead press 4lb x30   Other Supine Exercises large ABCs with green plyoball     Shoulder Exercises: Standing   Flexion Limitations with wand 5s holds, ecc lower 2 min   Extension Limitations triceps kicks 3lb x20   Other Standing Exercises x pulls yellow tband 2 min     Shoulder Exercises: ROM/Strengthening   UBE (Upper Arm Bike) 3/3 min L2.0     Shoulder Exercises: Stretch   Internal Rotation Stretch Limitations 2 min 10 s holds green strap   Table Stretch -Flexion Limitations 2 min with wand                PT Education - 01/01/16 0856    Education provided Yes   Education Details exercise form/rationale  Person(s) Educated Patient   Methods Explanation;Demonstration;Tactile cues;Verbal cues   Comprehension Verbalized understanding;Returned demonstration;Verbal cues required;Tactile cues required;Need further instruction             PT Long Term Goals - 12/18/15 8366      PT LONG TERM GOAL #1   Title Patient improves left shoulder AROM to within 75% of normal ROM for non-dominant shoulder (target date 8/10)   Baseline met for flexion and abduction, lacking in rotational motions   Status Partially Met     PT LONG TERM GOAL #2   Title Patient reports pain in left shoulder </= 3/10 with AROM.     Baseline 3-4/10   Status On-going     PT LONG TERM GOAL #3   Title Pt able to lift 10# tray simulating job tasks with proper mechanics independently.    Status Achieved     PT  LONG TERM GOAL #4   Title Patient demonstrates understanding of ongoing HEP for left shoulder ROM & strength and upper body posture.     Baseline is doing HEP but continues to protect arm, normalized use requries VC   Status On-going     PT LONG TERM GOAL #5   Title Pt will be able to reach up to grab plates and other kitchen materials from overhead shelves with minimal difficulty   Baseline mod difficulty on 7/10 with increased pain   Time 4   Period Weeks   Status New               Plan - 01/01/16 2947    Clinical Impression Statement Continues to demonstrate fatigue and difficulty with exercises as expected as pt increases challenges in work and functional activities. Will continue to beenfit from skilled PT in order to challenge strength and endurance of GHJ to reach LTG and keep shoulder safe during daily activities.    Rehab Potential Good   PT Frequency 3x / week   PT Duration 4 weeks   PT Treatment/Interventions ADLs/Self Care Home Management;Electrical Stimulation;Cryotherapy;Moist Heat;Ultrasound;Iontophoresis 69m/ml Dexamethasone;Therapeutic exercise;Neuromuscular re-education;Patient/family education;Manual techniques;Passive range of motion;Dry needling;Traction   PT Next Visit Plan Continue strength ROM , functional activity, review stretches, shoulder endurance, D1 & D2 patterns   PT Home Exercise Plan wall slides, AROM flx and scaption in mirror, contrast bath for wrist, HBB stretch with strap, table slides, ER and IR stretches at doorway and behind back, sleeper stretch, supine scap stab series, green band   Consulted and Agree with Plan of Care Patient      Patient will benefit from skilled therapeutic intervention in order to improve the following deficits and impairments:  Decreased activity tolerance, Decreased range of motion, Decreased strength, Increased muscle spasms, Impaired flexibility, Improper body mechanics, Postural dysfunction, Pain  Visit  Diagnosis: Pain in left shoulder  Muscle weakness (generalized)  Stiffness of left shoulder, not elsewhere classified  Stiffness of left wrist, not elsewhere classified     Problem List Patient Active Problem List   Diagnosis Date Noted  . Distal radius fracture, left 06/21/2015  . Hyperlipidemia 06/16/2014  . Essential hypertension 06/16/2014  . Diabetes mellitus type 2, insulin dependent [place] [employer] 03/24/2014    Liboria Putnam C. Rhylynn Perdomo PT, DPT 01/01/16 9:30 AM   [place] [address]Phone: [phone]  Fax:  [phone] Name: Brandy BOUTINMRN: [id number] of Birth: [birth year]

## 2016-01-03 ENCOUNTER — Ambulatory Visit: Payer: PRIVATE HEALTH INSURANCE | Admitting: Physical Therapy

## 2016-01-04 MED FILL — LISINOPRIL 10 MG TABLET: 10 | 90 days supply | Qty: 90 | Fill #0

## 2016-01-04 MED FILL — LANTUS SOLOSTAR 100 UNITS/M: 100 | 84 days supply | Qty: 27 | Fill #0

## 2016-01-04 MED FILL — SIMVASTATIN 40 MG TABLET: 40 | 90 days supply | Qty: 90 | Fill #0

## 2016-01-04 MED FILL — PANTOPRAZOLE SOD DR 40 MG T: 40 | 90 days supply | Qty: 90 | Fill #1

## 2016-01-08 ENCOUNTER — Ambulatory Visit: Payer: PRIVATE HEALTH INSURANCE

## 2016-01-08 DIAGNOSIS — M6281 Muscle weakness (generalized): Secondary | ICD-10-CM

## 2016-01-08 DIAGNOSIS — M25512 Pain in left shoulder: Secondary | ICD-10-CM

## 2016-01-08 DIAGNOSIS — M25612 Stiffness of left shoulder, not elsewhere classified: Secondary | ICD-10-CM

## 2016-01-08 NOTE — Therapy (Signed)
[place], [place], [address] Phone: [phone]   Fax:  [phone]  Physical Therapy Treatment  Patient Details  Name: Brandy Burns MRN: [id number] Date of Birth: 1960/08/27 Referring Provider: Iran Planas, MD  Encounter Date: 01/08/2016      PT End of Session - 01/08/16 1052    Visit Number 24   Number of Visits 28   Date for PT Re-Evaluation 01/12/16   Authorization Type Worker's Compensation 28 total visits approved   PT Start Time 0830   PT Stop Time 0915   PT Time Calculation (min) 45 min   Activity Tolerance Patient tolerated treatment well;No increased pain   Behavior During Therapy [facility] for tasks assessed/performed      Past Medical History:  Diagnosis Date  . Anemia    as a child  . Asthma   . Diabetes mellitus    type 2  . Essential hypertension 06/16/2014  . GERD (gastroesophageal reflux disease)   . Hyperlipemia   . Kidney stones     Past Surgical History:  Procedure Laterality Date  . COLONOSCOPY  09/23/2011   Procedure: COLONOSCOPY;  Surgeon: Juanita Craver, MD;  [facility];  Service: Endoscopy;  Laterality: N/A;  . ECTOPIC PREGNANCY SURGERY    . OPEN REDUCTION INTERNAL FIXATION (ORIF) DISTAL RADIAL FRACTURE Left 06/21/2015   Procedure: OPEN REDUCTION INTERNAL FIXATION (ORIF) DISTAL RADIAL FRACTURE AND REPAIR AS NEEDED;  Surgeon: Iran Planas, MD;  Location: [place];  Service: Orthopedics;  Laterality: Left;  . TONSILLECTOMY    . TUBAL LIGATION      There were no vitals filed for this visit.          [facility] PT Assessment - 01/08/16 0001      AROM   Left Shoulder Flexion 150 Degrees   Left Shoulder ABduction 152 Degrees   Left Shoulder External Rotation 64 Degrees                     [facility] Adult PT Treatment/Exercise - 01/08/16 1102      Shoulder Exercises: Supine   Flexion --  with dowel x 25 then x 20 lateral pull red band   Other  Supine Exercises supine L overhead press 5lb x30   Other Supine Exercises large ABCs with red plyoball,  then tricep x 5 5 pounds , 3 pounds x 20, then protraction with 5 pounds with bench press to ceiling     Shoulder Exercises: ROM/Strengthening   UBE (Upper Arm Bike) 3/3 min L2.0     Manual Therapy   Joint Mobilization gr 3 and 4 inf mob at end range flx and abd   Passive ROM all shoulder motions with emphasis on hor abduciton and ER gentle 30 sec stretching                     PT Long Term Goals - 01/08/16 1113      PT LONG TERM GOAL #1   Title Patient improves left shoulder AROM to within 75% of normal ROM for non-dominant shoulder (target date 8/10)   Baseline met for flexion and abduction, lacking in rotational motions   Status Partially Met     PT LONG TERM GOAL #2   Title Patient reports pain in left shoulder </= 3/10 with AROM.     Baseline 3-4/10   Status On-going     PT LONG TERM GOAL #3   Title Pt able  to lift 10# tray simulating job tasks with proper mechanics independently.    Status Achieved     PT LONG TERM GOAL #4   Title Patient demonstrates understanding of ongoing HEP for left shoulder ROM & strength and upper body posture.     Status On-going     PT LONG TERM GOAL #5   Title Pt will be able to reach up to grab plates and other kitchen materials from overhead shelves with minimal difficulty   Status On-going               Plan - 01/08/16 1053    Clinical Impression Statement She appears improved for last I saw [name]. She is lifting at work now and she relates she feels she is having some more pain and swelling due to this.    PT Treatment/Interventions ADLs/Self Care Home Management;Electrical Stimulation;Cryotherapy;Moist Heat;Ultrasound;Iontophoresis 38m/ml Dexamethasone;Therapeutic exercise;Neuromuscular re-education;Patient/family education;Manual techniques;Passive range of motion;Dry needling;Traction   PT Next Visit Plan  Continue strength ROM , functional activity, review stretches, shoulder endurance, D1 & D2 patterns   Consulted and Agree with Plan of Care Patient      Patient will benefit from skilled therapeutic intervention in order to improve the following deficits and impairments:  Decreased activity tolerance, Decreased range of motion, Decreased strength, Increased muscle spasms, Impaired flexibility, Improper body mechanics, Postural dysfunction, Pain  Visit Diagnosis: Pain in left shoulder  Muscle weakness (generalized)  Stiffness of left shoulder, not elsewhere classified     Problem List Patient Active Problem List   Diagnosis Date Noted  . Distal radius fracture, left 06/21/2015  . Hyperlipidemia 06/16/2014  . Essential hypertension 06/16/2014  . Diabetes mellitus type 2, insulin dependent ([place] 03/24/2014    CDarrel Hoover PT 01/08/2016, 11:15 AM  [facility] [address]Phone: [phone]  Fax:  [phone] Name: Brandy TATLOCKMRN: [id number] of Birth: [birth year]

## 2016-01-10 ENCOUNTER — Encounter: Payer: Self-pay | Admitting: Physical Therapy

## 2016-01-15 ENCOUNTER — Ambulatory Visit: Payer: 59 | Attending: Orthopedic Surgery | Admitting: Physical Therapy

## 2016-01-15 DIAGNOSIS — M25512 Pain in left shoulder: Secondary | ICD-10-CM | POA: Diagnosis not present

## 2016-01-15 DIAGNOSIS — M25632 Stiffness of left wrist, not elsewhere classified: Secondary | ICD-10-CM | POA: Insufficient documentation

## 2016-01-15 DIAGNOSIS — M25612 Stiffness of left shoulder, not elsewhere classified: Secondary | ICD-10-CM | POA: Insufficient documentation

## 2016-01-15 DIAGNOSIS — M6281 Muscle weakness (generalized): Secondary | ICD-10-CM | POA: Diagnosis not present

## 2016-01-15 NOTE — Therapy (Signed)
[facility] [address] [place], [place], [address] Phone: [phone]   Fax:  [phone]  Physical Therapy Treatment  Patient Details  Name: Brandy Burns MRN: [id number] Date of Birth: 05/18/1961 Referring Provider: Fred Ortmann, MD  Encounter Date: 01/15/2016      PT End of Session - 01/15/16 0945    Visit Number 25   Number of Visits 28   Date for PT Re-Evaluation 01/12/16   Authorization Type Worker's Compensation 28 total visits approved   PT Start Time 0845   PT Stop Time 0925   PT Time Calculation (min) 40 min      Past Medical History:  Diagnosis Date  . Anemia    as a child  . Asthma   . Diabetes mellitus    type 2  . Essential hypertension 06/16/2014  . GERD (gastroesophageal reflux disease)   . Hyperlipemia   . Kidney stones     Past Surgical History:  Procedure Laterality Date  . COLONOSCOPY  09/23/2011   Procedure: COLONOSCOPY;  Surgeon: Jyothi Mann, MD;  [facility];  Service: Endoscopy;  Laterality: N/A;  . ECTOPIC PREGNANCY SURGERY    . OPEN REDUCTION INTERNAL FIXATION (ORIF) DISTAL RADIAL FRACTURE Left 06/21/2015   Procedure: OPEN REDUCTION INTERNAL FIXATION (ORIF) DISTAL RADIAL FRACTURE AND REPAIR AS NEEDED;  Surgeon: Fred Ortmann, MD;  [facility];  Service: Orthopedics;  Laterality: Left;  . TONSILLECTOMY    . TUBAL LIGATION      There were no vitals filed for this visit.      Subjective Assessment - 01/15/16 0910    Currently in Pain? Yes   Pain Score 4    Pain Location Shoulder   Pain Orientation Left   Pain Descriptors / Indicators Aching            [facility] PT Assessment - 01/15/16 0001      AROM   Left Shoulder Flexion 150 Degrees   Left Shoulder ABduction 152 Degrees   Left Shoulder Internal Rotation --  L4 improved to L4 by end of tx   Left Shoulder External Rotation --  reach T-2     Strength   Left Shoulder Flexion 4/5   Left Shoulder ABduction  4/5   Left Shoulder Internal Rotation 4-/5   Left Shoulder External Rotation 4-/5                     [facility] Adult PT Treatment/Exercise - 01/15/16 0001      Shoulder Exercises: Standing   Horizontal ABduction 15 reps   Theraband Level (Shoulder Horizontal ABduction) Level 1 (Yellow)   External Rotation 20 reps;10 reps   Theraband Level (Shoulder External Rotation) Level 3 (Green)   Internal Rotation 20 reps;Theraband   Theraband Level (Shoulder Internal Rotation) Level 3 (Green)   Flexion 20 reps;10 reps   Theraband Level (Shoulder Flexion) Level 1 (Yellow)   ABduction 20 reps;Theraband   Theraband Level (Shoulder ABduction) Level 1 (Yellow)   Extension Limitations triceps kicks 3lb x20   Other Standing Exercises cabinet reaching 3# x 20    Other Standing Exercises bicep curls 5# x 20     Shoulder Exercises: Pulleys   Flexion 3 minutes     Shoulder Exercises: ROM/Strengthening   UBE (Upper Arm Bike) 3/3 min L2.5                     PT Long Term Goals - 01/15/16 0912        PT LONG TERM GOAL #1   Title Patient improves left shoulder AROM to within 75% of normal ROM for non-dominant shoulder (target date 8/10)   Baseline met for flexion and abduction, lacking in rotational motions   Time 5   Period Weeks   Status Partially Met     PT LONG TERM GOAL #2   Title Patient reports pain in left shoulder </= 3/10 with AROM.     Baseline 3-5/10   Time 5   Period Weeks   Status On-going     PT LONG TERM GOAL #3   Title Pt able to lift 10# tray simulating job tasks with proper mechanics independently.    Baseline did  60 feet x 5   Time 5   Period Weeks   Status Achieved     PT LONG TERM GOAL #4   Title Patient demonstrates understanding of ongoing HEP for left shoulder ROM & strength and upper body posture.     Baseline is doing HEP but continues to protect arm, normalized use requries VC   Time 5   Period Weeks   Status On-going     PT LONG  TERM GOAL #5   Title Pt will be able to reach up to grab plates and other kitchen materials from overhead shelves with minimal difficulty   Baseline min difficulty   Time 4   Period Weeks   Status Achieved               Plan - 01/15/16 0914    Clinical Impression Statement Minimal difficulty reaching into cabinets. LTG#5 Met. Strength and AROM improved. Pt sees MD this Thursday. She thinks he will release her. She is concerned about the lack of stretngth in her wrist that limites her lifting and carrying pots and pans.    PT Next Visit Plan Continue strength ROM , functional activity, review stretches, shoulder endurance, D1 & D2 patterns      Patient will benefit from skilled therapeutic intervention in order to improve the following deficits and impairments:  Decreased activity tolerance, Decreased range of motion, Decreased strength, Increased muscle spasms, Impaired flexibility, Improper body mechanics, Postural dysfunction, Pain  Visit Diagnosis: Pain in left shoulder  Muscle weakness (generalized)  Stiffness of left shoulder, not elsewhere classified     Problem List Patient Active Problem List   Diagnosis Date Noted  . Distal radius fracture, left 06/21/2015  . Hyperlipidemia 06/16/2014  . Essential hypertension 06/16/2014  . Diabetes mellitus type 2, insulin dependent ([place]) 03/24/2014    [name], PTA 01/15/2016, 9:45 AM  [facility] [address] [place], [place], [address] Phone: [phone]   Fax:  [phone]  Name: Brandy Burns MRN: [id number] Date of Birth: 1960-11-09

## 2016-01-17 MED FILL — BUPROPION HCL SR 150 MG TAB: 150 | 30 days supply | Qty: 30 | Fill #1

## 2016-01-22 ENCOUNTER — Ambulatory Visit: Payer: 59 | Admitting: Physical Therapy

## 2016-01-22 DIAGNOSIS — M25512 Pain in left shoulder: Secondary | ICD-10-CM | POA: Diagnosis not present

## 2016-01-22 DIAGNOSIS — M6281 Muscle weakness (generalized): Secondary | ICD-10-CM

## 2016-01-22 DIAGNOSIS — M25632 Stiffness of left wrist, not elsewhere classified: Secondary | ICD-10-CM | POA: Diagnosis not present

## 2016-01-22 DIAGNOSIS — M25612 Stiffness of left shoulder, not elsewhere classified: Secondary | ICD-10-CM | POA: Diagnosis not present

## 2016-01-22 NOTE — Therapy (Signed)
[place], [place], [address] Phone: [phone]   Fax:  [phone]  Physical Therapy Treatment  Patient Details  Name: Brandy Burns MRN: [id number] Date of Birth: Sep 27, 1960 Referring Provider: Iran Planas, MD  Encounter Date: 01/22/2016      PT End of Session - 01/22/16 0850    Visit Number 26   Number of Visits 28   Date for PT Re-Evaluation 01/29/16   Authorization Type Worker's Compensation 28 total visits approved   PT Start Time 0846   PT Stop Time 0928   PT Time Calculation (min) 42 min   Activity Tolerance Patient tolerated treatment well;No increased pain   Behavior During Therapy WFL for tasks assessed/performed      Past Medical History:  Diagnosis Date  . Anemia    as a child  . Asthma   . Diabetes mellitus    type 2  . Essential hypertension 06/16/2014  . GERD (gastroesophageal reflux disease)   . Hyperlipemia   . Kidney stones     Past Surgical History:  Procedure Laterality Date  . COLONOSCOPY  09/23/2011   Procedure: COLONOSCOPY;  Surgeon: Juanita Craver, MD;  Location: [facility];  Service: Endoscopy;  Laterality: N/A;  . ECTOPIC PREGNANCY SURGERY    . OPEN REDUCTION INTERNAL FIXATION (ORIF) DISTAL RADIAL FRACTURE Left 06/21/2015   Procedure: OPEN REDUCTION INTERNAL FIXATION (ORIF) DISTAL RADIAL FRACTURE AND REPAIR AS NEEDED;  Surgeon: Iran Planas, MD;  Location: [facility];  Service: Orthopedics;  Laterality: Left;  . TONSILLECTOMY    . TUBAL LIGATION      There were no vitals filed for this visit.      Subjective Assessment - 01/22/16 0852    Subjective Pt reports she is still having some difficulty with heavy lifting at work and arm tires easy but she is doing okay.    Patient Stated Goals She would like her left arm to get as close to normal as possible.    Currently in Pain? Yes   Pain Score 4    Pain Location Shoulder   Pain Orientation Left   Pain Descriptors  / Indicators Sore   Aggravating Factors  lifting, reaching   Pain Relieving Factors rest                         [facility] Adult PT Treatment/Exercise - 01/22/16 0001      Shoulder Exercises: Supine   Other Supine Exercises red plyo: large ABCs, serratus punches x30, rythmic stabs 3x1'(green)     Shoulder Exercises: Standing   External Rotation 20 reps;10 reps   Theraband Level (Shoulder External Rotation) Level 3 (Green)   Row Limitations upright row 10lb kettle bell x30   Other Standing Exercises external rotation paired with abd YTB   Other Standing Exercises bicep curls 5# x 30     Shoulder Exercises: Pulleys   Flexion 3 minutes     Shoulder Exercises: ROM/Strengthening   UBE (Upper Arm Bike) 3/3 min L2.5   Other ROM/Strengthening Exercises rebounder green plyoball 3 min     Shoulder Exercises: Stretch   Other Shoulder Stretches hand behind back green strap 3x20s     Hand Exercises   Other Hand Exercises finger flex/ext red theraweb                PT Education - 01/22/16 0853    Education Details exercise form/rationale   Person(s) Educated Patient  Methods Explanation;Demonstration;Tactile cues;Verbal cues   Comprehension Verbalized understanding;Returned demonstration;Verbal cues required;Tactile cues required;Need further instruction             PT Long Term Goals - 01/15/16 0912      PT LONG TERM GOAL #1   Title Patient improves left shoulder AROM to within 75% of normal ROM for non-dominant shoulder (target date 8/10)   Baseline met for flexion and abduction, lacking in rotational motions   Time 5   Period Weeks   Status Partially Met     PT LONG TERM GOAL #2   Title Patient reports pain in left shoulder </= 3/10 with AROM.     Baseline 3-5/10   Time 5   Period Weeks   Status On-going     PT LONG TERM GOAL #3   Title Pt able to lift 10# tray simulating job tasks with proper mechanics independently.    Baseline did  60 feet  x 5   Time 5   Period Weeks   Status Achieved     PT LONG TERM GOAL #4   Title Patient demonstrates understanding of ongoing HEP for left shoulder ROM & strength and upper body posture.     Baseline is doing HEP but continues to protect arm, normalized use requries VC   Time 5   Period Weeks   Status On-going     PT LONG TERM GOAL #5   Title Pt will be able to reach up to grab plates and other kitchen materials from overhead shelves with minimal difficulty   Baseline min difficulty   Time 4   Period Weeks   Status Achieved               Plan - 01/22/16 0923    Clinical Impression Statement Pt demo improved functional use of arm but demo poor endurance for long term activities such as repetitive reaching and lifting with appropriate form. Still limited in end range motions such as IR and ER and was educated on importance of continued stretching.    PT Next Visit Plan Continue strength ROM , functional activity, review stretches, shoulder endurance, D1 & D2 patterns; d/c 8/21   Consulted and Agree with Plan of Care Patient      Patient will benefit from skilled therapeutic intervention in order to improve the following deficits and impairments:     Visit Diagnosis: Pain in left shoulder  Muscle weakness (generalized)  Stiffness of left shoulder, not elsewhere classified  Stiffness of left wrist, not elsewhere classified     Problem List Patient Active Problem List   Diagnosis Date Noted  . Distal radius fracture, left 06/21/2015  . Hyperlipidemia 06/16/2014  . Essential hypertension 06/16/2014  . Diabetes mellitus type 2, insulin dependent ([place]) 03/24/2014    Freya Zobrist C. Lebaron Bautch PT, DPT 01/22/16 9:31 AM   [facility] [address] [place], [place], [address] Phone: [phone]   Fax:  [phone]  Name: Brandy Burns MRN: [id number] Date of Birth: 06-09-1961

## 2016-01-23 ENCOUNTER — Other Ambulatory Visit: Payer: Self-pay | Admitting: *Deleted

## 2016-01-24 ENCOUNTER — Ambulatory Visit: Payer: 59 | Admitting: Physical Therapy

## 2016-01-24 DIAGNOSIS — M25612 Stiffness of left shoulder, not elsewhere classified: Secondary | ICD-10-CM

## 2016-01-24 DIAGNOSIS — M6281 Muscle weakness (generalized): Secondary | ICD-10-CM | POA: Diagnosis not present

## 2016-01-24 DIAGNOSIS — M25632 Stiffness of left wrist, not elsewhere classified: Secondary | ICD-10-CM | POA: Diagnosis not present

## 2016-01-24 DIAGNOSIS — M25512 Pain in left shoulder: Secondary | ICD-10-CM | POA: Diagnosis not present

## 2016-01-24 NOTE — Patient Instructions (Signed)
Access Code: [email or web address]  URL: [email or web address]  Date: 01/24/2016  Prepared by: Selinda Eon   Exercises  Doorway Pec Stretch at 90 Degrees Abduction - 3 reps - 1 sets - 30 hold - 1x daily - 7x weekly  Standing Shoulder Internal Rotation Stretch with Towel - 3 reps - 1 sets - 30 hold - 1x daily - 7x weekly  Standing Shoulder Flexion Stretch on Wall - 10 reps - 1 sets - 10 hold - 1x daily - 7x weekly  Standing Shoulder External Rotation with Resistance - 30 reps - 1 sets - 5 hold - 1x daily - 7x weekly  Seated Shoulder Diagonal with Resistance - 30 reps - 1 sets - 3 hold - 1x daily - 7x weekly  Single Arm Punch with Resistance - 30 reps - 1 sets - 2 hold - 1x daily - 7x weekly  Standing Shoulder Flexion Full Range - 30 reps - 1 sets - 3 hold - 1x daily - 7x weekly  Standing Bent Over Triceps Extension - 30 reps - 1 sets - 3 hold - 1x daily - 7x weekly  Standing Row with Resistance - 30 reps - 1 sets - 3 hold - 1x daily - 7x weekly

## 2016-01-24 NOTE — Therapy (Signed)
[place], [place], [address] Phone: [phone]   Fax:  [phone]  Physical Therapy Treatment  Patient Details  Name: Brandy Burns MRN: [id number] Date of Birth: [birth year] Referring Provider: Iran Planas, MD  Encounter Date: 01/24/2016      PT End of Session - 01/24/16 0847    Visit Number 27   Number of Visits 28   Date for PT Re-Evaluation 01/29/16   Authorization Type Worker's Compensation 28 total visits approved   PT Start Time 0845   PT Stop Time 0924   PT Time Calculation (min) 39 min   Activity Tolerance Patient tolerated treatment well   Behavior During Therapy [facility] for tasks assessed/performed      Past Medical History:  Diagnosis Date  . Anemia    as a child  . Asthma   . Diabetes mellitus    type 2  . Essential hypertension 06/16/2014  . GERD (gastroesophageal reflux disease)   . Hyperlipemia   . Kidney stones     Past Surgical History:  Procedure Laterality Date  . COLONOSCOPY  09/23/2011   Procedure: COLONOSCOPY;  Surgeon: Juanita Craver, MD;  Location: [facility];  Service: Endoscopy;  Laterality: N/A;  . ECTOPIC PREGNANCY SURGERY    . OPEN REDUCTION INTERNAL FIXATION (ORIF) DISTAL RADIAL FRACTURE Left 06/21/2015   Procedure: OPEN REDUCTION INTERNAL FIXATION (ORIF) DISTAL RADIAL FRACTURE AND REPAIR AS NEEDED;  Surgeon: Iran Planas, MD;  Location: [facility];  Service: Orthopedics;  Laterality: Left;  . TONSILLECTOMY    . TUBAL LIGATION      There were no vitals filed for this visit.                       [facility] Adult PT Treatment/Exercise - 01/24/16 0001      Shoulder Exercises: Supine   Theraband Level (Shoulder Horizontal ABduction) Level 3 (Green)   Horizontal ABduction Limitations horizontal with alt diagonals 2 min     Shoulder Exercises: Prone   Other Prone Exercises plank elbows on low mat with alt reaches 2 min     Shoulder Exercises:  Standing   Theraband Level (Shoulder Protraction) Level 1 (Yellow)   Protraction Limitations punches x30   Shoulder Flexion Weight (lbs) 1   Flexion Limitations 57mn alt fwd and diagonal   Extension Limitations triceps kicks 1lb 2 min   Row Limitations upright row 5lb kettle bell each hand x20     Shoulder Exercises: ROM/Strengthening   UBE (Upper Arm Bike) 3/3 min L2.5     Shoulder Exercises: Stretch   Corner Stretch Limitations 2x20s door stretch   External Rotation Stretch 10 seconds  10 reps each                PT Education - 01/24/16 0847    Education provided Yes   Education Details exercise form/rationale   Person(s) Educated Patient   Methods Explanation;Demonstration;Tactile cues;Verbal cues   Comprehension Verbalized understanding;Returned demonstration;Verbal cues required;Tactile cues required;Need further instruction             PT Long Term Goals - 01/15/16 0912      PT LONG TERM GOAL #1   Title Patient improves left shoulder AROM to within 75% of normal ROM for non-dominant shoulder (target date 8/10)   Baseline met for flexion and abduction, lacking in rotational motions   Time 5   Period Weeks   Status Partially Met     PT  LONG TERM GOAL #2   Title Patient reports pain in left shoulder </= 3/10 with AROM.     Baseline 3-5/10   Time 5   Period Weeks   Status On-going     PT LONG TERM GOAL #3   Title Pt able to lift 10# tray simulating job tasks with proper mechanics independently.    Baseline did  60 feet x 5   Time 5   Period Weeks   Status Achieved     PT LONG TERM GOAL #4   Title Patient demonstrates understanding of ongoing HEP for left shoulder ROM & strength and upper body posture.     Baseline is doing HEP but continues to protect arm, normalized use requries VC   Time 5   Period Weeks   Status On-going     PT LONG TERM GOAL #5   Title Pt will be able to reach up to grab plates and other kitchen materials from overhead  shelves with minimal difficulty   Baseline min difficulty   Time 4   Period Weeks   Status Achieved               Plan - 01/24/16 0925    Clinical Impression Statement Pt had difficulty with increased challenges today, reporting fatigue but no increase in pain. Pt was provided with printout of final HEP today in order to prepare questions about it before her last visit.    PT Next Visit Plan D/C   PT Home Exercise Plan wall slides, AROM flx and scaption in mirror, contrast bath for wrist, HBB stretch with strap, table slides, ER and IR stretches at doorway and behind back, sleeper stretch, supine scap stab series, green band   Consulted and Agree with Plan of Care Patient      Patient will benefit from skilled therapeutic intervention in order to improve the following deficits and impairments:     Visit Diagnosis: Pain in left shoulder  Muscle weakness (generalized)  Stiffness of left shoulder, not elsewhere classified  Stiffness of left wrist, not elsewhere classified     Problem List Patient Active Problem List   Diagnosis Date Noted  . Distal radius fracture, left 06/21/2015  . Hyperlipidemia 06/16/2014  . Essential hypertension 06/16/2014  . Diabetes mellitus type 2, insulin dependent ([place]) 03/24/2014   Jenevie Casstevens C. Daleisa Halperin PT, DPT 01/24/16 9:27 AM   [facility] [address] [place], [place], [address] Phone: [phone]   Fax:  [phone]  Name: Brandy Burns MRN: [id number] Date of Birth: January 14, 1961

## 2016-01-25 NOTE — Patient Outreach (Signed)
[facility]) Care Management   01/23/2016  Brandy Burns [birth year] [id number]  Brandy Burns is an 55 y.o. female who presents to the [place] Management office for routine Link To Wellness follow up for self management assistance with Type II DM, HTN, hyperlipidemia and obesity.  Subjective: Brandy Burns says she is under increased stress due to her husband's recent health issues- fractured right (dominant) wrist, he will have surgery on Oct 2nd for a kidney mass. She says she will take a family medical leave to care for her husband. She also reports that her father- in -law died suddenly on 25-Dec-2022 although he did have dementia and was in a nursing home. 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 She says she will see her primary care provider on 04/09/16 and will have her Hgb A1C checked at that time She says since her last Link To Wellness visit , with the help of Wellbutrin , she has cut her smoking from 1/2 pack to 3/4 pack to 1 pack in 3 days. She reports no adverse side effects with the Wellbutrin. She says her fasting blood sugar was 136 on 8/15 and 146 on 01/24/16.   Objective:   Review of Systems  Constitutional: Negative.     Physical Exam  Constitutional: She is oriented to person, place, and time. She appears well-developed and well-nourished.  Respiratory: Effort normal.  Neurological: She is alert and oriented to person, place, and time.  Skin: Skin is warm and dry.  Psychiatric: She has a normal mood and affect. Her behavior is normal. Thought content normal.   Vitals:   01/23/16 1558  BP: 120/70   Filed Weights   01/23/16 1558  Weight: 202 lb 9.6 oz (91.9 kg)   Encounter Medications:   Outpatient Encounter Prescriptions as of 01/23/2016  Medication Sig Note  . aspirin 81 MG tablet Take 81 mg by mouth daily.   Marland Kitchen buPROPion (WELLBUTRIN XL) 150 MG 24 hr tablet  Take 150 mg by mouth daily. 01/23/2016: Started one month ago for smoking cessation  . insulin aspart (NOVOLOG FLEXPEN) 100 UNIT/ML FlexPen Inject 10 Units into the skin every evening. 10/26/2015: Takes with dinner  . insulin glargine (LANTUS) 100 UNIT/ML injection Inject 34 Units into the skin daily.  01/23/2016: Takes 34 units in the morning  . lisinopril (PRINIVIL,ZESTRIL) 10 MG tablet Take 10 mg by mouth daily.   . metFORMIN (GLUCOPHAGE) 1000 MG tablet Take 1,000 mg by mouth 2 (two) times daily with a meal.    . pantoprazole (PROTONIX) 40 MG tablet Take 40 mg by mouth daily.    . simvastatin (ZOCOR) 40 MG tablet Take 40 mg by mouth every evening.   . sitaGLIPtin (JANUVIA) 100 MG tablet Take 100 mg by mouth daily.   . traMADol (ULTRAM) 50 MG tablet Take 1 tablet (50 mg total) by mouth every 6 (six) hours as needed.   . docusate sodium (COLACE) 100 MG capsule Take 1 capsule (100 mg total) by mouth 2 (two) times daily. (Patient not taking: Reported on 10/05/2015) 01/23/2016: Stopped taking -ordered post op after surgery 06/21/15   . HYDROcodone-acetaminophen (NORCO/VICODIN) 5-325 MG tablet Take 1-2 tablets by mouth every 4 (four) hours as needed. (Patient not taking: Reported on 10/05/2015) 01/23/2016: Stopped by patient   . ipratropium (ATROVENT) 0.06 % nasal spray Place 2 sprays into both nostrils 4 (four) times daily. (Patient not taking: Reported on  10/05/2015) 01/23/2016: Discontinued by patient  . methocarbamol (ROBAXIN) 500 MG tablet Take 1 tablet (500 mg total) by mouth 4 (four) times daily. (Patient not taking: Reported on 10/26/2015) 01/23/2016: Discontinued by patient  . oxyCODONE-acetaminophen (ROXICET) 5-325 MG tablet Take 1 tablet by mouth every 4 (four) hours as needed for severe pain. (Patient not taking: Reported on 10/26/2015)   . predniSONE (DELTASONE) 20 MG tablet Take 2 tabs day 1 thru 3, 1 tab fourth, fifth and sixth day. Take with food. (Patient not taking: Reported on 10/26/2015)   .  vitamin C (ASCORBIC ACID) 500 MG tablet Take 1 tablet (500 mg total) by mouth daily. (Patient not taking: Reported on 10/26/2015) 07/20/2015: Has two more tablets left and then will stop   No facility-administered encounter medications on file as of 01/23/2016.     Functional Status:   In your present state of health, do you have any difficulty performing the following activities: 10/26/2015 07/20/2015  Hearing? N N  Vision? N N  Difficulty concentrating or making decisions? N N  Walking or climbing stairs? N N  Dressing or bathing? Y N  Doing errands, shopping? N N  Some recent data might be hidden    Fall/Depression Screening:    PHQ 2/9 Scores 07/20/2015 02/16/2015  PHQ - 2 Score 0 0    Assessment:  [employer] employee and Link To Wellness member recently returned to full time work after medical leave of absence due to left wrist injury, with Type II DM, HTN and hyperlipidemia with most recent Hgb A1C= 7.4% (improved from 7.9%) and meeting treatment targets for HTN and hyperlipidemia  Plan:  [facility] CM Care Plan Problem One   Flowsheet Row Most Recent Value  Care Plan Problem One Type II DM, obesity hyperlipidemia with Hgb A1C= 7.4% on 09/27/15, lipid panel normal on 09/27/15, obese, has cut tobacco use significantly with the help of Wellbutrin    Role Documenting the Problem One  Care Management Coordinator  Care Plan for Problem One  Active  [facility] Long Term Goal (31-90 days)  Brandy Burns will continue her efforts at smoking cessation with eventual  complete smoking cessation, improved glycemic control as evidenced by Hgb A1C <7.0%, ongoing good control of hyperlipidemia as evidenced by normal lipid profile, and no weight gain at next assessment   [facility] Long Term Goal Start Date  01/23/16  Interventions for Problem One Long Term Goal  reviewed medications and assessed medication, discussed mechanism of action, onset, peak and duration, and common side effects of novolog and lantus insulin, reviewed how to  titrate Lantus to achieve target fasting blood sugar of <130, reviewed frequency of blood sugar monitoring and blood sugar log history and reviewed pre and post meal blood sugar targets,  congratulated Brandy Burns on her efforts in reducing her tobacco use,   discussed health maintenance and encouraged her to ask about bone density test at her next appointment with her primary care provider, arranged for Link To Wellness follow up after she sees her primary care provider on 04/09/16      RNCM to fax today's office visit note to Noelle Redmon PA-C. RNCM will meet quarterly and as needed with patient per Link To Wellness program guidelines to assist with Type II DM, HTN, hyperlipidemia , obesity and smoking cessation self-management and assess patient's progress toward mutually set goals. Barrington Ellison RN,CCM,CDE [place] Management Coordinator Link To Wellness Office Phone [phone] Office Fax [phone]

## 2016-01-29 ENCOUNTER — Ambulatory Visit: Payer: 59 | Admitting: Physical Therapy

## 2016-01-29 ENCOUNTER — Encounter: Payer: Self-pay | Admitting: Physical Therapy

## 2016-01-29 DIAGNOSIS — M25512 Pain in left shoulder: Secondary | ICD-10-CM | POA: Diagnosis not present

## 2016-01-29 DIAGNOSIS — M25632 Stiffness of left wrist, not elsewhere classified: Secondary | ICD-10-CM | POA: Diagnosis not present

## 2016-01-29 DIAGNOSIS — M25612 Stiffness of left shoulder, not elsewhere classified: Secondary | ICD-10-CM | POA: Diagnosis not present

## 2016-01-29 DIAGNOSIS — M6281 Muscle weakness (generalized): Secondary | ICD-10-CM

## 2016-01-29 NOTE — Therapy (Signed)
[address], [place], [address] Phone: [phone]   Fax:  [phone]  Physical Therapy Treatment/Discharge Summary  Patient Details  Name: Brandy Burns MRN: [id number] Date of Birth: 1960/08/11 Referring Provider: Iran Planas, MD  Encounter Date: 01/29/2016      PT End of Session - 01/29/16 0821    Visit Number 28   Number of Visits 28   Date for PT Re-Evaluation 01/29/16   Authorization Type Worker's Compensation 28 total visits approved   PT Start Time 0817   PT Stop Time 0858   PT Time Calculation (min) 41 min   Activity Tolerance Patient tolerated treatment well   Behavior During Therapy [facility] for tasks assessed/performed      Past Medical History:  Diagnosis Date  . Anemia    as a child  . Asthma   . Diabetes mellitus    type 2  . Essential hypertension 06/16/2014  . GERD (gastroesophageal reflux disease)   . Hyperlipemia   . Kidney stones     Past Surgical History:  Procedure Laterality Date  . COLONOSCOPY  09/23/2011   Procedure: COLONOSCOPY;  Surgeon: Juanita Craver, MD;  Location: [facility];  Service: Endoscopy;  Laterality: N/A;  . ECTOPIC PREGNANCY SURGERY    . OPEN REDUCTION INTERNAL FIXATION (ORIF) DISTAL RADIAL FRACTURE Left 06/21/2015   Procedure: OPEN REDUCTION INTERNAL FIXATION (ORIF) DISTAL RADIAL FRACTURE AND REPAIR AS NEEDED;  Surgeon: Iran Planas, MD;  Location: [facility];  Service: Orthopedics;  Laterality: Left;  . TONSILLECTOMY    . TUBAL LIGATION      There were no vitals filed for this visit.      Subjective Assessment - 01/29/16 0819    Subjective Pt reports shoulder "feels okay"   Sometimes feels pain along lateral upper arm to elbow, work is "still a task, but I am maintaining"    Currently in Pain? Yes   Pain Score 3    Pain Location Shoulder   Pain Orientation Left   Pain Descriptors / Indicators Aching            [facility] PT Assessment - 01/29/16  0001      Observation/Other Assessments   Quick DASH  40% disability     AROM   Left Shoulder Flexion 130 Degrees   Left Shoulder ABduction 127 Degrees     Strength   Left Shoulder Flexion 4+/5   Left Shoulder ABduction 4+/5   Left Shoulder Internal Rotation 4+/5   Left Shoulder External Rotation 5/5                     [facility] Adult PT Treatment/Exercise - 01/29/16 0001      Shoulder Exercises: Supine   Theraband Level (Shoulder Horizontal ABduction) Level 3 (Green)   Horizontal ABduction Limitations x30   Other Supine Exercises rhythmic stabs 3x1'     Shoulder Exercises: Standing   Theraband Level (Shoulder Protraction) Level 2 (Red)   Protraction Limitations punches x30   External Rotation 20 reps;10 reps   Theraband Level (Shoulder External Rotation) Level 3 (Green)     Shoulder Exercises: ROM/Strengthening   UBE (Upper Arm Bike) 3/3 min L2.5     Shoulder Exercises: Stretch   Corner Stretch Limitations 2x20s door stretch   Wall Stretch - Flexion Limitations 10*10s   Other Shoulder Stretches hand behind back green strap 3x20s     Hand Exercises   Other Hand Exercises finger flex/ext red theraweb 2'  Wrist Exercises   Other wrist exercises phalens & reverse stretch                PT Education - 01/29/16 0820    Education provided Yes   Education Details exercise form/rationale, HEP   Person(s) Educated Patient   Methods Explanation;Demonstration;Tactile cues;Verbal cues   Comprehension Verbalized understanding;Returned demonstration;Verbal cues required;Tactile cues required             PT Long Term Goals - 01/29/16 9030      PT LONG TERM GOAL #1   Title Patient improves left shoulder AROM to within 75% of normal ROM for non-dominant shoulder (target date 8/10)   Baseline achieved for flexion and abduction   Status Partially Met     PT LONG TERM GOAL #2   Title Patient reports pain in left shoulder </= 3/10 with AROM.      Status Achieved     PT LONG TERM GOAL #3   Title Pt able to lift 10# tray simulating job tasks with proper mechanics independently.    Baseline able with some fatigue and mild difficulty   Status Achieved     PT LONG TERM GOAL #4   Title Patient demonstrates understanding of ongoing HEP for left shoulder ROM & strength and upper body posture.     Status Achieved     PT LONG TERM GOAL #5   Title Pt will be able to reach up to grab plates and other kitchen materials from overhead shelves with minimal difficulty   Status Achieved               Plan - 01/29/16 0829    Clinical Impression Statement Pt has met goals and will be d/c at this time to independent HEP. Pt demo decreased ROM measures today as expected with return to work and fatigue with increased lifting but denies any decrease in functional use. Pt verbalized comfort and understanding with HEP and was instructed to contact us with any further questions or concerns.    Consulted and Agree with Plan of Care Patient      Patient will benefit from skilled therapeutic intervention in order to improve the following deficits and impairments:     Visit Diagnosis: Pain in left shoulder  Muscle weakness (generalized)  Stiffness of left shoulder, not elsewhere classified  Stiffness of left wrist, not elsewhere classified     Problem List Patient Active Problem List   Diagnosis Date Noted  . Distal radius fracture, left 06/21/2015  . Hyperlipidemia 06/16/2014  . Essential hypertension 06/16/2014  . Diabetes mellitus type 2, insulin dependent ([place]) 03/24/2014    PHYSICAL THERAPY DISCHARGE SUMMARY  Visits from Start of Care: 28  Current functional level related to goals / functional outcomes: See above   Remaining deficits: See above   Education / Equipment: Anatomy of condition, POC HEP, exercise form/rationale  Plan: Patient agrees to discharge.  Patient goals were met. Patient is being discharged due to  meeting the stated rehab goals.  ?????     Keighan Amezcua C. Myers Tutterow PT, DPT 01/29/16 9:01 AM   [facility] [address] [place], [place], [address] Phone: [phone]   Fax:  [phone]  Name: Brandy Burns MRN: [id number] Date of Birth: 11-24-1960

## 2016-02-15 MED FILL — BUPROPION HCL SR 150 MG TAB: 150 | 30 days supply | Qty: 30 | Fill #2

## 2016-02-15 MED FILL — metFORMIN HCL 1000 MG TABS: 1000 | 90 days supply | Qty: 180 | Fill #0

## 2016-02-21 MED FILL — UNIFINE PENTIPS 32GX5/32": 32G X 4 MM | 50 days supply | Qty: 100 | Fill #1

## 2016-02-21 MED FILL — JANUVIA 100 MG TABLET: 100 | 90 days supply | Qty: 90 | Fill #0

## 2016-02-21 MED FILL — UNIFINE PENTIPS 32GX5/32: 32G X 4 MM | 50 days supply | Qty: 100 | Fill #1

## 2016-03-01 MED FILL — TRUE METRIX GLUCOSE TEST ST: 90 days supply | Qty: 100 | Fill #0

## 2016-03-21 MED FILL — LANTUS SOLOSTAR 100 UNITS/M: 100 | 56 days supply | Qty: 18 | Fill #1

## 2016-04-05 DIAGNOSIS — C641 Malignant neoplasm of right kidney, except renal pelvis: Secondary | ICD-10-CM | POA: Diagnosis not present

## 2016-04-05 MED FILL — SIMVASTATIN 40 MG TABLET: 40 | 90 days supply | Qty: 90 | Fill #1

## 2016-04-05 MED FILL — UNIFINE PENTIPS 32GX5/32": 32G X 4 MM | 50 days supply | Qty: 100 | Fill #2

## 2016-04-05 MED FILL — LISINOPRIL 10 MG TABLET: 10 | 90 days supply | Qty: 90 | Fill #1

## 2016-04-05 MED FILL — UNIFINE PENTIPS 32GX5/32: 32G X 4 MM | 50 days supply | Qty: 100 | Fill #2

## 2016-04-09 DIAGNOSIS — I10 Essential (primary) hypertension: Secondary | ICD-10-CM | POA: Diagnosis not present

## 2016-04-09 DIAGNOSIS — K219 Gastro-esophageal reflux disease without esophagitis: Secondary | ICD-10-CM | POA: Diagnosis not present

## 2016-04-09 DIAGNOSIS — Z720 Tobacco use: Secondary | ICD-10-CM | POA: Diagnosis not present

## 2016-04-09 DIAGNOSIS — E1165 Type 2 diabetes mellitus with hyperglycemia: Secondary | ICD-10-CM | POA: Diagnosis not present

## 2016-04-09 DIAGNOSIS — E7800 Pure hypercholesterolemia, unspecified: Secondary | ICD-10-CM | POA: Diagnosis not present

## 2016-04-09 DIAGNOSIS — Z0000 Encounter for general adult medical examination without abnormal findings: Secondary | ICD-10-CM | POA: Diagnosis not present

## 2016-04-19 ENCOUNTER — Other Ambulatory Visit: Payer: Self-pay | Admitting: *Deleted

## 2016-04-19 NOTE — Patient Outreach (Signed)
Spoke with patient via phone to arrange Link To Wellness follow up. Barrington Ellison RN,CCM,CDE [place] Management Coordinator Link To Wellness Office Phone [phone] Office Fax [phone]

## 2016-04-29 MED FILL — PANTOPRAZOLE SOD DR 40 MG T: 40 | 90 days supply | Qty: 90 | Fill #0

## 2016-04-29 MED FILL — NOVOLOG FLEXPEN SYRINGE: 100 | 90 days supply | Qty: 15 | Fill #2

## 2016-04-29 MED FILL — BUPROPION SR 150 MG TABLET: 150 | 30 days supply | Qty: 30 | Fill #3

## 2016-05-07 ENCOUNTER — Other Ambulatory Visit: Payer: Self-pay | Admitting: *Deleted

## 2016-05-07 ENCOUNTER — Encounter: Payer: Self-pay | Admitting: *Deleted

## 2016-05-07 NOTE — Patient Outreach (Signed)
[facility]) Care Management   05/07/2016  BRYLIN BALOUN 07-20-1960 [id number]  TANYLA GRADNEY is an 55 y.o. female who presents to the [facility] office for routine Link To Wellness follow up for self management assistance with Type II DM, HTN, hyperlipidemia and obesity.  Subjective: Carla Drape says she saw her primary care provider on 04/09/16 and  her Hgb A1C was checked at that time; the result was 8.0%. She says no changes were made to her medication regimen but is her A1C remains elevated at the next lab check on 2/5, then changes will be made. She says she is still smoking, but with the help of Wellbutrin , she has cut her smoking from 1/2 pack to 3/4 pack to 1 pack in 3 days. She reports no adverse side effects with the Wellbutrin. States it makes the cigarette takes bad.  She says her fasting blood sugar variance is 99-136.   Objective:   Review of Systems  Constitutional: Negative.     Physical Exam  Constitutional: She is oriented to person, place, and time. She appears well-developed and well-nourished.  Respiratory: Effort normal.  Neurological: She is alert and oriented to person, place, and time.  Skin: Skin is warm and dry.  Psychiatric: She has a normal mood and affect. Her behavior is normal. Thought content normal.   Vitals:   05/07/16 1604  BP: 118/80   Filed Weights   05/07/16 1604  Weight: 201 lb 12.8 oz (91.5 kg)   Encounter Medications:   Outpatient Encounter Prescriptions as of 05/07/2016  Medication Sig Note  . aspirin 81 MG tablet Take 81 mg by mouth daily.   Marland Kitchen buPROPion (WELLBUTRIN XL) 150 MG 24 hr tablet Take 150 mg by mouth daily. 01/23/2016: Started one month ago for smoking cessation  . insulin aspart (NOVOLOG FLEXPEN) 100 UNIT/ML FlexPen Inject 10 Units into the skin every evening. 10/26/2015: Takes with dinner  . insulin glargine (LANTUS) 100 UNIT/ML injection Inject 34 Units  into the skin daily.  01/23/2016: Takes 34 units in the morning  . lisinopril (PRINIVIL,ZESTRIL) 10 MG tablet Take 10 mg by mouth daily.   . metFORMIN (GLUCOPHAGE) 1000 MG tablet Take 1,000 mg by mouth 2 (two) times daily with a meal.    . pantoprazole (PROTONIX) 40 MG tablet Take 40 mg by mouth daily.    . simvastatin (ZOCOR) 40 MG tablet Take 40 mg by mouth every evening.   . sitaGLIPtin (JANUVIA) 100 MG tablet Take 100 mg by mouth daily.   Marland Kitchen docusate sodium (COLACE) 100 MG capsule Take 1 capsule (100 mg total) by mouth 2 (two) times daily. (Patient not taking: Reported on 05/07/2016) 01/23/2016: Stopped taking -ordered post op after surgery 06/21/15   . HYDROcodone-acetaminophen (NORCO/VICODIN) 5-325 MG tablet Take 1-2 tablets by mouth every 4 (four) hours as needed. (Patient not taking: Reported on 05/07/2016) 01/23/2016: Stopped by patient   . ipratropium (ATROVENT) 0.06 % nasal spray Place 2 sprays into both nostrils 4 (four) times daily. (Patient not taking: Reported on 05/07/2016) 01/23/2016: Discontinued by patient  . methocarbamol (ROBAXIN) 500 MG tablet Take 1 tablet (500 mg total) by mouth 4 (four) times daily. (Patient not taking: Reported on 05/07/2016) 01/23/2016: Discontinued by patient  . oxyCODONE-acetaminophen (ROXICET) 5-325 MG tablet Take 1 tablet by mouth every 4 (four) hours as needed for severe pain. (Patient not taking: Reported on 05/07/2016)   . predniSONE (DELTASONE) 20 MG tablet Take 2 tabs day  1 thru 3, 1 tab fourth, fifth and sixth day. Take with food. (Patient not taking: Reported on 05/07/2016)   . traMADol (ULTRAM) 50 MG tablet Take 1 tablet (50 mg total) by mouth every 6 (six) hours as needed. (Patient not taking: Reported on 05/07/2016)   . vitamin C (ASCORBIC ACID) 500 MG tablet Take 1 tablet (500 mg total) by mouth daily. (Patient not taking: Reported on 05/07/2016) 07/20/2015: Has two more tablets left and then will stop   No facility-administered encounter medications  on file as of 05/07/2016.     Functional Status:   In your present state of health, do you have any difficulty performing the following activities: 05/07/2016 10/26/2015  Hearing? N N  Vision? N N  Difficulty concentrating or making decisions? N N  Walking or climbing stairs? N N  Dressing or bathing? N Y  Doing errands, shopping? N N  Some recent data might be hidden    Fall/Depression Screening:    PHQ 2/9 Scores 07/20/2015 02/16/2015  PHQ - 2 Score 0 0    Assessment:  [employer] employee and Link To Wellness member with Type II DM and hyperlipidemia with most recent Hgb A1C= 8.0 , increased from 7.4% and meeting treatment targets for hyperlipidemia  Plan:  [facility] CM Care Plan Problem One   Flowsheet Row Most Recent Value  Care Plan Problem One Type II DM, obesity and  hyperlipidemia with Hgb A1C= 8.0% on 04/09/16, lipid panel normal, obese, has cut tobacco use significantly with the help of Wellbutrin    Role Documenting the Problem One  Care Management Coordinator  Care Plan for Problem One  Active  [facility] Long Term Goal (31-90 days) Dottie will continue her efforts at smoking cessation with eventual  complete smoking cessation, improved glycemic control as evidenced by Hgb A1C <7.0%, ongoing good control of hyperlipidemia as evidenced by normal lipid profile, and no weight gain at next assessment   [facility] Long Term Goal Start Date  05/07/16  Interventions for Problem One Long Term Goal  reviewed medications and assessed medication adherence, explained that due to pharmacy formulary change in 2018 she will need to ask her provider about changing her novolog to humalog in order for the medication to be covered at no cost,  discussed option of asking primary care provider about the addition of a GLP1 agonist or a SGLT2 inhibitor if her Hgb A1C does not improve at the next check, provided written information for her to review on both classes of medications, reviewed frequency of blood sugar  monitoring and blood sugar log history and reviewed pre and post meal blood sugar targets,  congratulated Dottie on her efforts in reducing her tobacco use, educated her to the [employer] and enrolled her, advised her that DM self management education will occur via [place] in place of the [employer] To Wellness program, reviewed upcoming appointments with her primary care provider on 2/5 for labs and 4/30 for office visit,       RNCM to fax today's office visit note to [employer].  Barrington Ellison RN,CCM,CDE [place] Management Coordinator Link To Wellness Office Phone [phone] Office Fax [phone]

## 2016-05-15 MED FILL — LANTUS SOLOSTAR 100 UNITS/M: 100 | 84 days supply | Qty: 27 | Fill #0

## 2016-05-15 MED FILL — metFORMIN HCL 1000 MG TABS: 1000 | 90 days supply | Qty: 180 | Fill #1

## 2016-05-20 MED FILL — JANUVIA 100 MG TABLET: 100 | 90 days supply | Qty: 90 | Fill #1

## 2016-06-04 ENCOUNTER — Encounter (HOSPITAL_COMMUNITY): Payer: Self-pay | Admitting: Emergency Medicine

## 2016-06-04 ENCOUNTER — Ambulatory Visit: Payer: 59

## 2016-06-04 ENCOUNTER — Ambulatory Visit (HOSPITAL_COMMUNITY): Payer: 59

## 2016-06-04 ENCOUNTER — Ambulatory Visit (HOSPITAL_COMMUNITY)
Admission: EM | Admit: 2016-06-04 | Discharge: 2016-06-04 | Disposition: A | Discharge status: Home / Self Care | Payer: 59 | Attending: Family Medicine | Admitting: Family Medicine

## 2016-06-04 ENCOUNTER — Ambulatory Visit (INDEPENDENT_AMBULATORY_CARE_PROVIDER_SITE_OTHER): Payer: 59

## 2016-06-04 DIAGNOSIS — S92402A Displaced unspecified fracture of left great toe, initial encounter for closed fracture: Secondary | ICD-10-CM | POA: Diagnosis not present

## 2016-06-04 DIAGNOSIS — S92412A Displaced fracture of proximal phalanx of left great toe, initial encounter for closed fracture: Secondary | ICD-10-CM | POA: Diagnosis not present

## 2016-06-04 MED ORDER — HYDROCODONE-ACETAMINOPHEN 5-325 MG PO TABS: 1.0000 | ORAL_TABLET | Freq: Four times a day (QID) | ORAL | 0 refills | Status: AC | PRN

## 2016-06-04 NOTE — ED Provider Notes (Signed)
[facility]    CSN: [id number] Arrival date & time: 06/04/16  1214     History   Chief Complaint Chief Complaint  Patient presents with  . Foot Pain    HPI Brandy Burns is a 55 y.o. female.   55 year old woman comes in for evaluation of left foot pain after tripping over a boot 4 days ago. She works at [employer] and is the sole breadwinner for her family so she can't miss work.      Past Medical History:  Diagnosis Date  . Anemia    as a child  . Asthma   . Diabetes mellitus    type 2  . Essential hypertension 06/16/2014  . GERD (gastroesophageal reflux disease)   . Hyperlipemia   . Kidney stones     Patient Active Problem List   Diagnosis Date Noted  . Distal radius fracture, left 06/21/2015  . Hyperlipidemia 06/16/2014  . Essential hypertension 06/16/2014  . Diabetes mellitus type 2, insulin dependent ([place]) 03/24/2014    Past Surgical History:  Procedure Laterality Date  . COLONOSCOPY  09/23/2011   Procedure: COLONOSCOPY;  Surgeon: Brandy Craver, MD;  [facility]: [facility];  Service: Endoscopy;  Laterality: N/A;  . ECTOPIC PREGNANCY SURGERY    . OPEN REDUCTION INTERNAL FIXATION (ORIF) DISTAL RADIAL FRACTURE Left 06/21/2015   Procedure: OPEN REDUCTION INTERNAL FIXATION (ORIF) DISTAL RADIAL FRACTURE AND REPAIR AS NEEDED;  Surgeon: Brandy Planas, MD;  Location: [facility];  Service: Orthopedics;  Laterality: Left;  . TONSILLECTOMY    . TUBAL LIGATION      OB History    No data available       Home Medications    Prior to Admission medications   Medication Sig Start Date End Date Taking? Authorizing Provider  aspirin 81 MG tablet Take 81 mg by mouth daily.   Yes Historical Provider, MD  insulin aspart (NOVOLOG FLEXPEN) 100 UNIT/ML FlexPen Inject 10 Units into the skin every evening.   Yes Historical Provider, MD  insulin glargine (LANTUS) 100 UNIT/ML injection Inject 34 Units into the skin daily.    Yes Historical Provider, MD    lisinopril (PRINIVIL,ZESTRIL) 10 MG tablet Take 10 mg by mouth daily.   Yes Historical Provider, MD  metFORMIN (GLUCOPHAGE) 1000 MG tablet Take 1,000 mg by mouth 2 (two) times daily with a meal.    Yes Historical Provider, MD  pantoprazole (PROTONIX) 40 MG tablet Take 40 mg by mouth daily.    Yes Historical Provider, MD  simvastatin (ZOCOR) 40 MG tablet Take 40 mg by mouth every evening.   Yes Historical Provider, MD  sitaGLIPtin (JANUVIA) 100 MG tablet Take 100 mg by mouth daily.   Yes Historical Provider, MD    Family History Family History  Problem Relation Age of Onset  . Hypertension Other   . Hyperlipidemia Other   . Arthritis Other   . COPD Other   . Obesity Other   . COPD Mother   . Diabetes Mother   . Hypertension Mother   . Cancer Father   . Diabetes Brother   . Hypertension Brother     Social History Social History  Substance Use Topics  . Smoking status: Current Every Day Smoker    Packs/day: 0.50    Years: 0.00    Types: Cigarettes  . Smokeless tobacco: Never Used  . Alcohol use No     Allergies   Sulfa antibiotics   Review of Systems Review of Systems  Constitutional: Negative.   HENT: Negative.   Respiratory: Negative.   Musculoskeletal: Positive for arthralgias, gait problem and joint swelling.  Neurological: Negative for dizziness.     Physical Exam Triage Vital Signs ED Triage Vitals  Enc Vitals Group     BP 06/04/16 1331 130/72     Pulse Rate 06/04/16 1331 76     Resp 06/04/16 1331 20     Temp 06/04/16 1331 98.2 F (36.8 C)     Temp Source 06/04/16 1331 Oral     SpO2 06/04/16 1331 99 %     Weight --      Height --      Head Circumference --      Peak Flow --      Pain Score 06/04/16 1336 5     Pain Loc --      Pain Edu? --      Excl. in [place]? --    No data found.   Updated Vital Signs BP 130/72 (BP Location: Left Arm)   Pulse 76   Temp 98.2 F (36.8 C) (Oral)   Resp 20   SpO2 99%    Physical Exam  Constitutional: She  is oriented to person, place, and time. She appears well-developed and well-nourished.  HENT:  Right Ear: External ear normal.  Left Ear: External ear normal.  Eyes: Conjunctivae and EOM are normal.  Neck: Normal range of motion. Neck supple.  Pulmonary/Chest: Effort normal.  Musculoskeletal: She exhibits edema and tenderness. She exhibits no deformity.  Marked ecchymosis at the proximal phalanx of the great toe on the left. This am swelling in the adjacent toes along with some ecchymosis there as well. There is no gross bony deformity.  Neurological: She is alert and oriented to person, place, and time.  Skin: Skin is warm and dry.  Nursing note and vitals reviewed.    UC Treatments / Results  Labs (all labs ordered are listed, but only abnormal results are displayed) Labs Reviewed - No data to display  EKG  EKG Interpretation None       Radiology Dg Foot Complete Left  Result Date: 06/04/2016 CLINICAL DATA:  Trip and fall 4 days ago. Pain along the base of the great toe. Initial encounter. EXAM: LEFT FOOT - COMPLETE 3+ VIEW COMPARISON:  01/07/2010 FINDINGS: Nondisplaced fracture involving the base of the great toe proximal phalanx, along the plantar surface. No dislocation. Cortical thickening along the medial second metatarsal shaft is chronic when compared to prior. IMPRESSION: Nondisplaced fracture of the great toe proximal phalanx base. Electronically Signed   By: Brandy Burns M.D.   On: 06/04/2016 13:59    Procedures Procedures (including critical care time)  Medications Ordered in UC Medications - No data to display   Initial Impression / Assessment and Plan / UC Course  I have reviewed the triage vital signs and the nursing notes.  Pertinent labs & imaging results that were available during my care of the patient were reviewed by me and considered in my medical decision making (see chart for details).  Clinical Course     Final Clinical Impressions(s) / UC  Diagnoses   Final diagnoses:  Closed displaced fracture of phalanx of left great toe, unspecified phalanx, initial encounter    New Prescriptions New Prescriptions   No medications on file  Follow-up with orthopedics, Cam Walker for now   Brandy Haber, MD 06/04/16 1404

## 2016-06-04 NOTE — Discharge Instructions (Signed)
This will need to be monitored by an orthopedic surgeon. There is no displacement of the bones but it will need follow-up to make sure the bone stay in place.  Use the Cam Walker whenever you're weightbearing.

## 2016-06-04 NOTE — ED Triage Notes (Signed)
The patient presented to the [facility] with a complaint of left foot secondary to tripping over a boot 4 days ago.

## 2016-06-05 ENCOUNTER — Telehealth (HOSPITAL_COMMUNITY): Payer: Self-pay | Admitting: Emergency Medicine

## 2016-06-05 NOTE — Telephone Encounter (Signed)
Pt called... States she was seen here yest, 12/26, for a fx left greater toe  States she was placed on a cam walker and is doing fine but now needs a note from Dr stating that there is no restrictions  Per Dr Juventino Slovak... Ok to return to work w/no restrictions... Pt adv note will be left w/front staff and pt is still to f/u w/orthopedics   Pt verb understanding

## 2016-06-12 DIAGNOSIS — M25529 Pain in unspecified elbow: Secondary | ICD-10-CM | POA: Diagnosis not present

## 2016-06-12 MED FILL — DICLOFENAC SOD EC 50 MG TAB: 50 | 20 days supply | Qty: 40 | Fill #0

## 2016-07-15 DIAGNOSIS — Z7984 Long term (current) use of oral hypoglycemic drugs: Secondary | ICD-10-CM | POA: Diagnosis not present

## 2016-07-15 DIAGNOSIS — E1165 Type 2 diabetes mellitus with hyperglycemia: Secondary | ICD-10-CM | POA: Diagnosis not present

## 2016-07-15 DIAGNOSIS — Z794 Long term (current) use of insulin: Secondary | ICD-10-CM | POA: Diagnosis not present

## 2016-07-23 MED FILL — PANTOPRAZOLE SOD DR 40 MG T: 40 | 90 days supply | Qty: 90 | Fill #0

## 2016-07-23 MED FILL — UNIFINE PENTIPS 32GX5/32: 32G X 4 MM | 50 days supply | Qty: 100 | Fill #0

## 2016-07-23 MED FILL — SIMVASTATIN 40 MG TABLET: 40 | 90 days supply | Qty: 90 | Fill #0

## 2016-07-23 MED FILL — LISINOPRIL 10 MG TABLET: 10 | 90 days supply | Qty: 90 | Fill #0

## 2016-07-23 MED FILL — UNIFINE PENTIPS 32GX5/32": 32G X 4 MM | 50 days supply | Qty: 100 | Fill #0

## 2016-07-25 MED FILL — HUMALOG 100 UNITS/ML KWIKPE: 100 | 90 days supply | Qty: 9 | Fill #0

## 2016-07-31 MED FILL — LANTUS SOLOSTAR 100 UNITS/M: 100 | 56 days supply | Qty: 18 | Fill #1

## 2016-08-19 MED FILL — JANUVIA 100 MG TABLET: 100 | 90 days supply | Qty: 90 | Fill #0

## 2016-08-19 MED FILL — metFORMIN HCL 1000 MG TABS: 1000 | 90 days supply | Qty: 180 | Fill #0

## 2016-09-17 MED FILL — LANTUS SOLOSTAR 100 UNITS/M: 100 | 46 days supply | Qty: 15 | Fill #0

## 2016-09-23 MED FILL — UNIFINE PENTIPS 32GX5/32": 32G X 4 MM | 50 days supply | Qty: 100 | Fill #1

## 2016-09-23 MED FILL — UNIFINE PENTIPS 32GX5/32: 32G X 4 MM | 50 days supply | Qty: 100 | Fill #1

## 2016-09-27 MED FILL — ACCU-CHEK GUIDE TEST STRIP: 90 days supply | Qty: 100 | Fill #1

## 2016-09-27 MED FILL — ACCU-CHEK FASTCLIX LANCETS: 90 days supply | Qty: 102 | Fill #0

## 2016-10-07 DIAGNOSIS — K219 Gastro-esophageal reflux disease without esophagitis: Secondary | ICD-10-CM | POA: Diagnosis not present

## 2016-10-07 DIAGNOSIS — I10 Essential (primary) hypertension: Secondary | ICD-10-CM | POA: Diagnosis not present

## 2016-10-07 DIAGNOSIS — Z794 Long term (current) use of insulin: Secondary | ICD-10-CM | POA: Diagnosis not present

## 2016-10-07 DIAGNOSIS — E1165 Type 2 diabetes mellitus with hyperglycemia: Secondary | ICD-10-CM | POA: Diagnosis not present

## 2016-10-07 DIAGNOSIS — E7800 Pure hypercholesterolemia, unspecified: Secondary | ICD-10-CM | POA: Diagnosis not present

## 2016-10-11 ENCOUNTER — Other Ambulatory Visit: Payer: Self-pay | Admitting: Physician Assistant

## 2016-10-11 DIAGNOSIS — Z1231 Encounter for screening mammogram for malignant neoplasm of breast: Secondary | ICD-10-CM

## 2016-10-21 DIAGNOSIS — H524 Presbyopia: Secondary | ICD-10-CM | POA: Diagnosis not present

## 2016-10-21 DIAGNOSIS — H2513 Age-related nuclear cataract, bilateral: Secondary | ICD-10-CM | POA: Diagnosis not present

## 2016-10-21 DIAGNOSIS — E119 Type 2 diabetes mellitus without complications: Secondary | ICD-10-CM | POA: Diagnosis not present

## 2016-10-21 DIAGNOSIS — Z794 Long term (current) use of insulin: Secondary | ICD-10-CM | POA: Diagnosis not present

## 2016-10-21 MED FILL — SIMVASTATIN 40 MG TABLET: 40 | 90 days supply | Qty: 90 | Fill #1

## 2016-10-21 MED FILL — BUPROPION SR 150 MG TABLET: 150 | 30 days supply | Qty: 30 | Fill #4

## 2016-10-21 MED FILL — PANTOPRAZOLE SOD DR 40 MG T: 40 | 90 days supply | Qty: 90 | Fill #1

## 2016-10-21 MED FILL — LISINOPRIL 10 MG TABLET: 10 | 90 days supply | Qty: 90 | Fill #1

## 2016-10-21 MED FILL — HUMALOG 100 UNITS/ML KWIKPE: 100 | 90 days supply | Qty: 9 | Fill #1

## 2016-10-29 ENCOUNTER — Ambulatory Visit
Admission: RE | Admit: 2016-10-29 | Discharge: 2016-10-29 | Disposition: A | Discharge status: Home / Self Care | Payer: 59 | Source: Ambulatory Visit | Attending: Physician Assistant | Admitting: Physician Assistant

## 2016-10-29 DIAGNOSIS — Z1231 Encounter for screening mammogram for malignant neoplasm of breast: Secondary | ICD-10-CM

## 2016-11-01 MED FILL — LANTUS SOLOSTAR 100 UNITS/M: 100 | 46 days supply | Qty: 15 | Fill #1

## 2016-12-04 MED FILL — JANUVIA 100 MG TABLET: 100 | 90 days supply | Qty: 90 | Fill #1

## 2016-12-04 MED FILL — BUPROPION SR 150 MG TABLET: 150 | 30 days supply | Qty: 30 | Fill #0

## 2016-12-04 MED FILL — metFORMIN HCL 1000 MG TABS: 1000 | 90 days supply | Qty: 180 | Fill #1

## 2016-12-13 ENCOUNTER — Ambulatory Visit (HOSPITAL_COMMUNITY)
Admission: EM | Admit: 2016-12-13 | Discharge: 2016-12-13 | Disposition: A | Discharge status: Home / Self Care | Payer: 59 | Attending: Internal Medicine | Admitting: Internal Medicine

## 2016-12-13 ENCOUNTER — Encounter (HOSPITAL_COMMUNITY): Payer: Self-pay | Admitting: Family Medicine

## 2016-12-13 DIAGNOSIS — M778 Other enthesopathies, not elsewhere classified: Secondary | ICD-10-CM

## 2016-12-13 DIAGNOSIS — M79644 Pain in right finger(s): Secondary | ICD-10-CM

## 2016-12-13 DIAGNOSIS — M779 Enthesopathy, unspecified: Secondary | ICD-10-CM

## 2016-12-13 MED ORDER — IBUPROFEN 800 MG PO TABS: 800.0000 mg | ORAL_TABLET | Freq: Three times a day (TID) | ORAL | 0 refills | Status: AC

## 2016-12-13 MED FILL — IBUPROFEN 800 MG TAB: 800 | 7 days supply | Qty: 21 | Fill #0

## 2016-12-13 NOTE — ED Triage Notes (Signed)
Pt here for right hand pain more in the right thumb. Denies injury.

## 2016-12-13 NOTE — ED Provider Notes (Signed)
CSN: [id number]     Arrival date & time 12/13/16  1220 History   None    Chief Complaint  Patient presents with  . Hand Pain   (Consider location/radiation/quality/duration/timing/severity/associated sxs/prior Treatment) Patient c/o right thumb discomfort and denies an injury.  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   The history is provided by the patient.  Hand Pain  This is a new problem. The problem occurs constantly. The problem has not changed since onset.Nothing aggravates the symptoms. Nothing relieves the symptoms. She has tried nothing for the symptoms.    Past Medical History:  Diagnosis Date  . Anemia    as a child  . Asthma   . Diabetes mellitus    type 2  . Essential hypertension 06/16/2014  . GERD (gastroesophageal reflux disease)   . Hyperlipemia   . Kidney stones    Past Surgical History:  Procedure Laterality Date  . COLONOSCOPY  09/23/2011   Procedure: COLONOSCOPY;  Surgeon: Juanita Craver, MD;  Location: [facility];  Service: Endoscopy;  Laterality: N/A;  . ECTOPIC PREGNANCY SURGERY    . OPEN REDUCTION INTERNAL FIXATION (ORIF) DISTAL RADIAL FRACTURE Left 06/21/2015   Procedure: OPEN REDUCTION INTERNAL FIXATION (ORIF) DISTAL RADIAL FRACTURE AND REPAIR AS NEEDED;  Surgeon: Iran Planas, MD;  Location: [facility];  Service: Orthopedics;  Laterality: Left;  . TONSILLECTOMY    . TUBAL LIGATION     Family History  Problem Relation Age of Onset  . COPD Mother   . Diabetes Mother   . Hypertension Mother   . Cancer Father   . Diabetes Brother   . Hypertension Brother   . Hypertension Other   . Hyperlipidemia Other   . Arthritis Other   . COPD Other   . Obesity Other    Social History  Substance Use Topics  . Smoking status: Current Every Day Smoker    Packs/day: 0.50    Years: 0.00    Types: Cigarettes  . Smokeless tobacco: Never Used  . Alcohol use No   OB History    No data available     Review of Systems   Constitutional: Negative.   HENT: Negative.   Eyes: Negative.   Respiratory: Negative.   Cardiovascular: Negative.   Gastrointestinal: Negative.   Endocrine: Negative.   Genitourinary: Negative.   Musculoskeletal: Positive for arthralgias.  Allergic/Immunologic: Negative.   Neurological: Negative.     Allergies  Sulfa antibiotics  Home Medications   Prior to Admission medications   Medication Sig Start Date End Date Taking? Authorizing Provider  aspirin 81 MG tablet Take 81 mg by mouth daily.    [provider]  HYDROcodone-acetaminophen (NORCO) 5-325 MG tablet Take 1 tablet by mouth every 6 (six) hours as needed for moderate pain. 06/04/16   Robyn Haber, MD  ibuprofen (ADVIL,MOTRIN) 800 MG tablet Take 1 tablet (800 mg total) by mouth 3 (three) times daily. 12/13/16   Lysbeth Penner, FNP  insulin aspart (NOVOLOG FLEXPEN) 100 UNIT/ML FlexPen Inject 10 Units into the skin every evening.    [provider]  insulin glargine (LANTUS) 100 UNIT/ML injection Inject 34 Units into the skin daily.     [provider]  lisinopril (PRINIVIL,ZESTRIL) 10 MG tablet Take 10 mg by mouth daily.    [provider]  metFORMIN (GLUCOPHAGE) 1000 MG tablet Take 1,000 mg by mouth 2 (two) times daily with a meal.     [provider]  pantoprazole (PROTONIX) 40 MG tablet Take 40 mg by mouth daily.     [provider]  simvastatin (ZOCOR) 40 MG tablet Take 40 mg by mouth every evening.    [provider]  sitaGLIPtin (JANUVIA) 100 MG tablet Take 100 mg by mouth daily.    [provider]   Meds Ordered and Administered this Visit  Medications - No data to display  BP 122/76   Pulse 89   Temp 98.4 F (36.9 C)   Resp 18   SpO2 98%  No data found.   Physical Exam  Constitutional: She appears well-developed and well-nourished.  HENT:  Head: Normocephalic and atraumatic.  Eyes: Conjunctivae and EOM are normal. Pupils are  equal, round, and reactive to light.  Neck: Normal range of motion. Neck supple.  Cardiovascular: Normal rate, regular rhythm and normal heart sounds.   Pulmonary/Chest: Effort normal and breath sounds normal.  Musculoskeletal: She exhibits tenderness.  TTP right thumb and right MCP joint.  Nursing note and vitals reviewed.   Urgent Care Course     Procedures (including critical care time)  Labs Review Labs Reviewed - No data to display  Imaging Review No results found.   Visual Acuity Review  Right Eye Distance:   Left Eye Distance:   Bilateral Distance:    Right Eye Near:   Left Eye Near:    Bilateral Near:         MDM   1. Pain of right thumb   2. Tendonitis of finger    Right thumb spica splint  Motrin 800mg  one po tid x 7 days #21      Lysbeth Penner, FNP 12/13/16 1344

## 2016-12-18 MED FILL — UNIFINE PENTIPS 32GX5/32": 32G X 4 MM | 50 days supply | Qty: 100 | Fill #0

## 2016-12-18 MED FILL — UNIFINE PENTIPS 32GX5/32: 32G X 4 MM | 50 days supply | Qty: 100 | Fill #0

## 2016-12-18 MED FILL — ACCU-CHEK GUIDE TEST STRIP: 90 days supply | Qty: 100 | Fill #0

## 2016-12-18 MED FILL — LANTUS SOLOSTAR 100 UNITS/M: 100 | 46 days supply | Qty: 15 | Fill #2

## 2016-12-20 ENCOUNTER — Other Ambulatory Visit: Payer: Self-pay | Admitting: *Deleted

## 2016-12-20 NOTE — Patient Outreach (Signed)
Received message from Dottie in the [facility] platform inquiring about the [employer] continuous glucose monitor. Called Dottie and left her a message with information including how the system works, the ned for a prescription from her provider, the out of pocket costs for the reader and sensors and that the system does not integrate with the [place] but that the interstitial fluid glucose readings can be manually entered in [place]. Advised her to contact this RNCM if she has further questions.  Barrington Ellison RN,CCM,CDE [place] Management Coordinator [facility] and [employer] [phone] Office Fax [phone]

## 2017-01-14 MED FILL — LISINOPRIL 10 MG TABLET: 10 | 90 days supply | Qty: 90 | Fill #0

## 2017-01-14 MED FILL — PANTOPRAZOLE SOD DR 40 MG T: 40 | 90 days supply | Qty: 90 | Fill #0

## 2017-01-14 MED FILL — SIMVASTATIN 40 MG TABLET: 40 | 90 days supply | Qty: 90 | Fill #0

## 2017-02-03 MED FILL — LANTUS SOLOSTAR 100 UNITS/M: 100 | 46 days supply | Qty: 15 | Fill #0

## 2017-02-03 MED FILL — HUMALOG 100 UNITS/ML KWIKPE: 100 | 90 days supply | Qty: 9 | Fill #2

## 2017-02-25 MED FILL — UNIFINE PENTIPS 32GX5/32: 32G X 4 MM | 50 days supply | Qty: 100 | Fill #1

## 2017-02-25 MED FILL — JANUVIA 100 MG TABLET: 100 | 90 days supply | Qty: 90 | Fill #0

## 2017-02-25 MED FILL — UNIFINE PENTIPS 32GX5/32": 32G X 4 MM | 50 days supply | Qty: 100 | Fill #1

## 2017-03-04 DIAGNOSIS — M65311 Trigger thumb, right thumb: Secondary | ICD-10-CM | POA: Diagnosis not present

## 2017-03-18 MED FILL — LANTUS SOLOSTAR 100 UNITS/M: 100 | 46 days supply | Qty: 15 | Fill #1

## 2017-03-18 MED FILL — ACCU-CHEK GUIDE TEST STRIP: 90 days supply | Qty: 100 | Fill #1

## 2017-04-11 DIAGNOSIS — Z124 Encounter for screening for malignant neoplasm of cervix: Secondary | ICD-10-CM | POA: Diagnosis not present

## 2017-04-11 DIAGNOSIS — Z01419 Encounter for gynecological examination (general) (routine) without abnormal findings: Secondary | ICD-10-CM | POA: Diagnosis not present

## 2017-04-14 MED FILL — UNIFINE PENTIPS 32GX5/32": 32G X 4 MM | 50 days supply | Qty: 100 | Fill #2

## 2017-04-14 MED FILL — UNIFINE PENTIPS 32GX5/32: 32G X 4 MM | 50 days supply | Qty: 100 | Fill #2

## 2017-04-14 MED FILL — PANTOPRAZOLE SOD DR 40 MG T: 40 | 90 days supply | Qty: 90 | Fill #1

## 2017-04-14 MED FILL — LISINOPRIL 10 MG TABS: 10 | 90 days supply | Qty: 90 | Fill #1

## 2017-04-14 MED FILL — SIMVASTATIN 40 MG TABLET: 40 | 90 days supply | Qty: 90 | Fill #1

## 2017-04-28 DIAGNOSIS — Z794 Long term (current) use of insulin: Secondary | ICD-10-CM | POA: Diagnosis not present

## 2017-04-28 DIAGNOSIS — Z23 Encounter for immunization: Secondary | ICD-10-CM | POA: Diagnosis not present

## 2017-04-28 DIAGNOSIS — Z720 Tobacco use: Secondary | ICD-10-CM | POA: Diagnosis not present

## 2017-04-28 DIAGNOSIS — I10 Essential (primary) hypertension: Secondary | ICD-10-CM | POA: Diagnosis not present

## 2017-04-28 DIAGNOSIS — K219 Gastro-esophageal reflux disease without esophagitis: Secondary | ICD-10-CM | POA: Diagnosis not present

## 2017-04-28 DIAGNOSIS — Z0000 Encounter for general adult medical examination without abnormal findings: Secondary | ICD-10-CM | POA: Diagnosis not present

## 2017-04-28 DIAGNOSIS — E7800 Pure hypercholesterolemia, unspecified: Secondary | ICD-10-CM | POA: Diagnosis not present

## 2017-04-28 DIAGNOSIS — E1165 Type 2 diabetes mellitus with hyperglycemia: Secondary | ICD-10-CM | POA: Diagnosis not present

## 2017-05-05 MED FILL — LANTUS SOLOSTAR 100 UNITS/M: 100 | 46 days supply | Qty: 15 | Fill #2

## 2017-05-05 MED FILL — HUMALOG 100 UNITS/ML KWIKPE: 100 | 90 days supply | Qty: 9 | Fill #3

## 2017-05-14 ENCOUNTER — Other Ambulatory Visit: Payer: Self-pay | Admitting: *Deleted

## 2017-05-14 NOTE — Patient Outreach (Signed)
Dottie transitioned from the [employer] [facility] program to the [employer] on 07/03/16 for Type II diabetes self-management assistance so will close case to the diabetes [facility] program due to delegation of disease management services to [employer] from [employer] for [place] members in 2019. Barrington Ellison RN,CCM,CDE [place] Management Coordinator [facility] [employer] [phone] Office Fax [phone]

## 2017-05-26 MED FILL — UNIFINE PENTIPS 32GX5/32: 32G X 4 MM | 90 days supply | Qty: 100 | Fill #0

## 2017-05-26 MED FILL — UNIFINE PENTIPS 32GX5/32": 32G X 4 MM | 90 days supply | Qty: 100 | Fill #0

## 2017-05-26 MED FILL — JANUVIA 100 MG TABLET: 100 | 90 days supply | Qty: 90 | Fill #0

## 2017-06-13 MED FILL — LANTUS SOLOSTAR 100 UNITS/M: 100 | 46 days supply | Qty: 15 | Fill #0

## 2017-06-13 MED FILL — ACCU-CHEK GUIDE TEST STRIP: 90 days supply | Qty: 100 | Fill #2

## 2017-06-13 MED FILL — metFORMIN HCL 1000 MG TABS: 1000 | 90 days supply | Qty: 180 | Fill #0

## 2017-07-10 MED FILL — PANTOPRAZOLE SOD DR 40 MG T: 40 | 90 days supply | Qty: 90 | Fill #0

## 2017-07-10 MED FILL — LISINOPRIL 10 MG TABLET: 10 | 90 days supply | Qty: 90 | Fill #0

## 2017-07-18 MED FILL — SIMVASTATIN 40 MG TABLET: 40 | 90 days supply | Qty: 90 | Fill #0

## 2017-07-21 MED FILL — CHANTIX STARTING MONTH BOX: 0.5 MG X 11 | 30 days supply | Qty: 53 | Fill #0

## 2017-07-29 MED FILL — LANTUS SOLOSTAR 100 UNITS/M: 100 | 46 days supply | Qty: 15 | Fill #1

## 2017-08-14 MED FILL — UNIFINE PENTIPS 32GX5/32: 32G X 4 MM | 90 days supply | Qty: 100 | Fill #0

## 2017-08-14 MED FILL — UNIFINE PENTIPS 32GX5/32": 32G X 4 MM | 90 days supply | Qty: 100 | Fill #0

## 2017-08-14 MED FILL — ACCU-CHEK FASTCLIX LANCETS: 90 days supply | Qty: 102 | Fill #1

## 2017-08-24 MED FILL — ACCU-CHEK GUIDE TEST STRIP: 90 days supply | Qty: 100 | Fill #0

## 2017-08-26 MED FILL — JANUVIA 100 MG TABLET: 100 | 90 days supply | Qty: 90 | Fill #1

## 2017-09-09 MED FILL — LANTUS SOLOSTAR 100 UNITS/M: 100 | 46 days supply | Qty: 15 | Fill #2

## 2017-09-09 MED FILL — HUMALOG 100 UNITS/ML KWIKPE: 100 | 90 days supply | Qty: 9 | Fill #0

## 2017-09-18 MED FILL — CHANTIX STARTING MONTH BOX: 0.5 MG X 11 | 30 days supply | Qty: 53 | Fill #0

## 2017-10-07 ENCOUNTER — Other Ambulatory Visit: Payer: Self-pay | Admitting: Physician Assistant

## 2017-10-07 DIAGNOSIS — Z1231 Encounter for screening mammogram for malignant neoplasm of breast: Secondary | ICD-10-CM

## 2017-10-10 MED FILL — PANTOPRAZOLE SOD DR 40 MG T: 40 | 90 days supply | Qty: 90 | Fill #1

## 2017-10-10 MED FILL — LISINOPRIL 10 MG TABS: 10 | 90 days supply | Qty: 90 | Fill #1

## 2017-10-10 MED FILL — SIMVASTATIN 40 MG TABLET: 40 | 90 days supply | Qty: 90 | Fill #1

## 2017-10-21 MED FILL — metFORMIN HCL 1000 MG TABS: 1000 | 90 days supply | Qty: 180 | Fill #1

## 2017-10-21 MED FILL — LANTUS SOLOSTAR 100 UNITS/M: 100 | 46 days supply | Qty: 15 | Fill #0

## 2017-10-25 MED FILL — UNIFINE PENTIPS 32GX5/32": 32G X 4 MM | 90 days supply | Qty: 100 | Fill #0

## 2017-10-25 MED FILL — UNIFINE PENTIPS 32GX5/32: 32G X 4 MM | 90 days supply | Qty: 100 | Fill #0

## 2017-10-29 DIAGNOSIS — Z794 Long term (current) use of insulin: Secondary | ICD-10-CM | POA: Diagnosis not present

## 2017-10-29 DIAGNOSIS — K219 Gastro-esophageal reflux disease without esophagitis: Secondary | ICD-10-CM | POA: Diagnosis not present

## 2017-10-29 DIAGNOSIS — Z720 Tobacco use: Secondary | ICD-10-CM | POA: Diagnosis not present

## 2017-10-29 DIAGNOSIS — E7800 Pure hypercholesterolemia, unspecified: Secondary | ICD-10-CM | POA: Diagnosis not present

## 2017-10-29 DIAGNOSIS — I10 Essential (primary) hypertension: Secondary | ICD-10-CM | POA: Diagnosis not present

## 2017-10-29 DIAGNOSIS — E1165 Type 2 diabetes mellitus with hyperglycemia: Secondary | ICD-10-CM | POA: Diagnosis not present

## 2017-10-30 ENCOUNTER — Ambulatory Visit: Payer: 59

## 2017-10-31 MED FILL — CHANTIX 1 MG CONT MONTH BOX: 1 | 28 days supply | Qty: 56 | Fill #0

## 2017-11-20 ENCOUNTER — Ambulatory Visit
Admission: RE | Admit: 2017-11-20 | Discharge: 2017-11-20 | Disposition: A | Discharge status: Home / Self Care | Payer: 59 | Source: Ambulatory Visit | Attending: Physician Assistant | Admitting: Physician Assistant

## 2017-11-20 DIAGNOSIS — Z1231 Encounter for screening mammogram for malignant neoplasm of breast: Secondary | ICD-10-CM | POA: Diagnosis not present

## 2017-11-24 MED FILL — CHANTIX 1 MG CONT MONTH BOX: 1 | 28 days supply | Qty: 56 | Fill #1

## 2017-11-24 MED FILL — JANUVIA 100 MG TABLET: 100 | 90 days supply | Qty: 90 | Fill #0

## 2017-12-02 MED FILL — LANTUS SOLOSTAR 100 UNITS/M: 100 | 46 days supply | Qty: 15 | Fill #1

## 2017-12-02 MED FILL — UNIFINE PENTIPS 32GX5/32: 32G X 4 MM | 90 days supply | Qty: 300 | Fill #0

## 2017-12-02 MED FILL — ACCU-CHEK GUIDE TEST STRIP: 90 days supply | Qty: 100 | Fill #0

## 2017-12-02 MED FILL — UNIFINE PENTIPS 32GX5/32": 32G X 4 MM | 90 days supply | Qty: 300 | Fill #0

## 2017-12-02 MED FILL — HUMALOG 100 UNITS/ML KWIKPE: 100 | 90 days supply | Qty: 18 | Fill #0

## 2018-01-02 MED FILL — LISINOPRIL 10 MG TABLET: 10 | 90 days supply | Qty: 90 | Fill #0

## 2018-01-02 MED FILL — SIMVASTATIN 40 MG TABS: 40 | 90 days supply | Qty: 90 | Fill #0

## 2018-01-02 MED FILL — PANTOPRAZOLE SOD DR 40 MG T: 40 | 90 days supply | Qty: 90 | Fill #0

## 2018-01-09 MED FILL — LANTUS SOLOSTAR 100 UNITS/M: 100 | 44 days supply | Qty: 15 | Fill #0

## 2018-02-24 MED FILL — HUMALOG 100 UNITS/ML KWIKPE: 100 | 90 days supply | Qty: 18 | Fill #0

## 2018-02-24 MED FILL — LANTUS SOLOSTAR 100 UNITS/M: 100 | 44 days supply | Qty: 15 | Fill #1

## 2018-02-24 MED FILL — JANUVIA 100 MG TABLET: 100 | 90 days supply | Qty: 90 | Fill #1

## 2018-03-13 MED FILL — UNIFINE PENTIPS 32GX5/32: 32G X 4 MM | 90 days supply | Qty: 100 | Fill #1

## 2018-03-13 MED FILL — ACCU-CHEK GUIDE TEST STRIP: 90 days supply | Qty: 100 | Fill #1

## 2018-03-13 MED FILL — UNIFINE PENTIPS 32GX5/32": 32G X 4 MM | 90 days supply | Qty: 100 | Fill #1

## 2018-04-01 MED FILL — metFORMIN HCL 1000 MG TABS: 1000 | 90 days supply | Qty: 180 | Fill #0

## 2018-04-01 MED FILL — PANTOPRAZOLE SOD DR 40 MG T: 40 | 90 days supply | Qty: 90 | Fill #1

## 2018-04-01 MED FILL — LANTUS SOLOSTAR 100 UNITS/M: 100 | 44 days supply | Qty: 15 | Fill #2

## 2018-04-01 MED FILL — SIMVASTATIN 40 MG TABS: 40 | 90 days supply | Qty: 90 | Fill #1

## 2018-04-01 MED FILL — LISINOPRIL 10 MG TABLET: 10 | 90 days supply | Qty: 90 | Fill #1

## 2018-04-30 MED FILL — AMOXICILLIN 500 MG CAPSULE: 500 | 7 days supply | Qty: 22 | Fill #0

## 2018-05-01 DIAGNOSIS — E7800 Pure hypercholesterolemia, unspecified: Secondary | ICD-10-CM | POA: Diagnosis not present

## 2018-05-01 DIAGNOSIS — Z720 Tobacco use: Secondary | ICD-10-CM | POA: Diagnosis not present

## 2018-05-01 DIAGNOSIS — I10 Essential (primary) hypertension: Secondary | ICD-10-CM | POA: Diagnosis not present

## 2018-05-01 DIAGNOSIS — K219 Gastro-esophageal reflux disease without esophagitis: Secondary | ICD-10-CM | POA: Diagnosis not present

## 2018-05-01 DIAGNOSIS — Z0000 Encounter for general adult medical examination without abnormal findings: Secondary | ICD-10-CM | POA: Diagnosis not present

## 2018-05-01 DIAGNOSIS — E1165 Type 2 diabetes mellitus with hyperglycemia: Secondary | ICD-10-CM | POA: Diagnosis not present

## 2018-05-01 DIAGNOSIS — Z794 Long term (current) use of insulin: Secondary | ICD-10-CM | POA: Diagnosis not present

## 2018-05-06 MED FILL — LANTUS SOLOSTAR 100 UNITS/M: 100 | 88 days supply | Qty: 30 | Fill #0

## 2018-06-07 ENCOUNTER — Ambulatory Visit (HOSPITAL_COMMUNITY)
Admission: EM | Admit: 2018-06-07 | Discharge: 2018-06-07 | Disposition: A | Discharge status: Home / Self Care | Payer: 59 | Attending: Family Medicine | Admitting: Family Medicine

## 2018-06-07 ENCOUNTER — Encounter (HOSPITAL_COMMUNITY): Payer: Self-pay | Admitting: Emergency Medicine

## 2018-06-07 DIAGNOSIS — J40 Bronchitis, not specified as acute or chronic: Secondary | ICD-10-CM | POA: Insufficient documentation

## 2018-06-07 MED ORDER — HYDROCODONE-HOMATROPINE 5-1.5 MG/5ML PO SYRP: 5.0000 mL | ORAL_SOLUTION | Freq: Four times a day (QID) | ORAL | 0 refills | Status: AC | PRN

## 2018-06-07 MED ORDER — PREDNISONE 50 MG PO TABS: ORAL_TABLET | ORAL | 0 refills | Status: DC

## 2018-06-07 NOTE — ED Triage Notes (Signed)
Pt here with cough and URI sx

## 2018-06-07 NOTE — Discharge Instructions (Addendum)
Return if symptoms don't improve in 2 days

## 2018-06-07 NOTE — ED Provider Notes (Signed)
[place]    CSN: [id number] Arrival date & time: 06/07/18  1051     History   Chief Complaint Chief Complaint  Patient presents with  . Cough    HPI Brandy Burns is a 57 y.o. female.   Is a 57 year old established patient who comes in complaining of flulike symptoms.  Patient is a smoker and she did have the flu shot.  She has had coughing since about December 22.  She has had no phlegm being produced.  She has had no fever.  Patient has had this kind of problem in the past treated with prednisone.  There is been no nausea or vomiting, chest pain, or shortness of breath.  She is somewhat hoarse.     Past Medical History:  Diagnosis Date  . Anemia    as a child  . Asthma   . Diabetes mellitus    type 2  . Essential hypertension 06/16/2014  . GERD (gastroesophageal reflux disease)   . Hyperlipemia   . Kidney stones     Patient Active Problem List   Diagnosis Date Noted  . Distal radius fracture, left 06/21/2015  . Hyperlipidemia 06/16/2014  . Essential hypertension 06/16/2014  . Diabetes mellitus type 2, insulin dependent ([place]) 03/24/2014    Past Surgical History:  Procedure Laterality Date  . COLONOSCOPY  09/23/2011   Procedure: COLONOSCOPY;  Surgeon: Juanita Craver, MD;  Location: [facility];  Service: Endoscopy;  Laterality: N/A;  . ECTOPIC PREGNANCY SURGERY    . OPEN REDUCTION INTERNAL FIXATION (ORIF) DISTAL RADIAL FRACTURE Left 06/21/2015   Procedure: OPEN REDUCTION INTERNAL FIXATION (ORIF) DISTAL RADIAL FRACTURE AND REPAIR AS NEEDED;  Surgeon: Iran Planas, MD;  Location: [facility];  Service: Orthopedics;  Laterality: Left;  . TONSILLECTOMY    . TUBAL LIGATION      OB History   No obstetric history on file.      Home Medications    Prior to Admission medications   Medication Sig Start Date End Date Taking? Authorizing Provider  aspirin 81 MG tablet Take 81 mg by mouth daily.    [provider]  HYDROcodone-acetaminophen  (NORCO) 5-325 MG tablet Take 1 tablet by mouth every 6 (six) hours as needed for moderate pain. 06/04/16   Robyn Haber, MD  HYDROcodone-homatropine (HYDROMET) 5-1.5 MG/5ML syrup Take 5 mLs by mouth every 6 (six) hours as needed for cough. 06/07/18   Robyn Haber, MD  ibuprofen (ADVIL,MOTRIN) 800 MG tablet Take 1 tablet (800 mg total) by mouth 3 (three) times daily. 12/13/16   Lysbeth Penner, FNP  insulin aspart (NOVOLOG FLEXPEN) 100 UNIT/ML FlexPen Inject 10 Units into the skin every evening.    [provider]  insulin glargine (LANTUS) 100 UNIT/ML injection Inject 34 Units into the skin daily.     [provider]  lisinopril (PRINIVIL,ZESTRIL) 10 MG tablet Take 10 mg by mouth daily.    [provider]  metFORMIN (GLUCOPHAGE) 1000 MG tablet Take 1,000 mg by mouth 2 (two) times daily with a meal.     [provider]  pantoprazole (PROTONIX) 40 MG tablet Take 40 mg by mouth daily.     [provider]  predniSONE (DELTASONE) 50 MG tablet One daily with food 06/07/18   Robyn Haber, MD  simvastatin (ZOCOR) 40 MG tablet Take 40 mg by mouth every evening.    [provider]  sitaGLIPtin (JANUVIA) 100 MG tablet Take 100 mg by mouth daily.    [provider]    Family History Family History  Problem Relation Age of Onset  . COPD Mother   . Diabetes Mother   . Hypertension Mother   . Cancer Father   . Diabetes Brother   . Hypertension Brother   . Hypertension Other   . Hyperlipidemia Other   . Arthritis Other   . COPD Other   . Obesity Other     Social History Social History   Tobacco Use  . Smoking status: Current Every Day Smoker    Packs/day: 0.50    Years: 0.00    Pack years: 0.00    Types: Cigarettes  . Smokeless tobacco: Never Used  Substance Use Topics  . Alcohol use: No  . Drug use: No     Allergies   Sulfa antibiotics   Review of Systems Review of Systems   Physical Exam Triage Vital  Signs ED Triage Vitals [06/07/18 1223]  Enc Vitals Group     BP 116/61     Pulse Rate 73     Resp 18     Temp 97.8 F (36.6 C)     Temp Source Oral     SpO2 99 %     Weight      Height      Head Circumference      Peak Flow      Pain Score 5     Pain Loc      Pain Edu?      Excl. in [place]?    No data found.  Updated Vital Signs BP 116/61 (BP Location: Right Arm)   Pulse 73   Temp 97.8 F (36.6 C) (Oral)   Resp 18   SpO2 99%   Physical Exam Vitals signs and nursing note reviewed.  Constitutional:      Appearance: Normal appearance.  HENT:     Head: Normocephalic.     Right Ear: Tympanic membrane, ear canal and external ear normal.     Left Ear: Tympanic membrane, ear canal and external ear normal.     Nose: Nose normal.     Mouth/Throat:     Mouth: Mucous membranes are moist.  Eyes:     Conjunctiva/sclera: Conjunctivae normal.  Neck:     Musculoskeletal: Normal range of motion.  Cardiovascular:     Rate and Rhythm: Normal rate.     Heart sounds: Normal heart sounds.  Pulmonary:     Effort: Pulmonary effort is normal.     Breath sounds: Wheezing present.  Musculoskeletal: Normal range of motion.  Skin:    General: Skin is warm and dry.  Neurological:     General: No focal deficit present.     Mental Status: She is alert.  Psychiatric:        Mood and Affect: Mood normal.        Behavior: Behavior normal.      UC Treatments / Results  Labs (all labs ordered are listed, but only abnormal results are displayed) Labs Reviewed - No data to display  EKG None  Radiology No results found.  Procedures Procedures (including critical care time)  Medications Ordered in UC Medications - No data to display  Initial Impression / Assessment and Plan / UC Course  I have reviewed the triage vital signs and the nursing notes.  Pertinent labs & imaging results that were available during my care of the patient were reviewed by me and considered in my medical  decision making (see chart for details).  Final Clinical Impressions(s) / UC Diagnoses   Final diagnoses:  Bronchitis     Discharge Instructions     Return if symptoms don't improve in 2 days    ED Prescriptions    Medication Sig Dispense Auth. Provider   predniSONE (DELTASONE) 50 MG tablet One daily with food 5 tablet Robyn Haber, MD   HYDROcodone-homatropine (HYDROMET) 5-1.5 MG/5ML syrup Take 5 mLs by mouth every 6 (six) hours as needed for cough. 60 mL Robyn Haber, MD     Controlled Substance Prescriptions [place] Controlled Substance Registry consulted? Not Applicable   Robyn Haber, MD 06/07/18 1302

## 2018-06-08 MED FILL — ACCU-CHEK GUIDE TEST STRIP: 90 days supply | Qty: 100 | Fill #2

## 2018-06-08 MED FILL — UNIFINE PENTIPS 32GX5/32: 32G X 4 MM | 90 days supply | Qty: 300 | Fill #0

## 2018-06-12 DIAGNOSIS — J069 Acute upper respiratory infection, unspecified: Secondary | ICD-10-CM | POA: Diagnosis not present

## 2018-06-12 DIAGNOSIS — Z720 Tobacco use: Secondary | ICD-10-CM | POA: Diagnosis not present

## 2018-06-12 DIAGNOSIS — J209 Acute bronchitis, unspecified: Secondary | ICD-10-CM | POA: Diagnosis not present

## 2018-06-12 MED FILL — BENZONATATE 200 MG CAP: 200 | 10 days supply | Qty: 30 | Fill #0

## 2018-06-12 MED FILL — VENTOLIN HFA 90 MCG INHALER: 108 (90 BAS | 33 days supply | Qty: 18 | Fill #0

## 2018-06-12 MED FILL — DOXYCYCLINE MONOHYDRATE 100: 100 | 10 days supply | Qty: 20 | Fill #0

## 2018-06-17 MED FILL — HUMALOG 100 UNITS/ML KWIKPE: 100 | 90 days supply | Qty: 18 | Fill #1

## 2018-06-17 MED FILL — LISINOPRIL 10 MG TABLET: 10 | 90 days supply | Qty: 90 | Fill #0

## 2018-06-17 MED FILL — metFORMIN HCL 1000 MG TABS: 1000 | 90 days supply | Qty: 180 | Fill #0

## 2018-06-17 MED FILL — SIMVASTATIN 40 MG TABLET: 40 | 90 days supply | Qty: 90 | Fill #0

## 2018-06-17 MED FILL — JANUVIA 100 MG TABLET: 100 | 90 days supply | Qty: 90 | Fill #0

## 2018-06-17 MED FILL — PANTOPRAZOLE SOD DR 40 MG T: 40 | 90 days supply | Qty: 90 | Fill #0

## 2018-07-29 MED FILL — CHANTIX STARTING MONTH BOX: 0.5 MG X 11 | 28 days supply | Qty: 53 | Fill #0

## 2018-07-29 MED FILL — LANTUS SOLOSTAR 100 UNITS/M: 100 | 88 days supply | Qty: 30 | Fill #0

## 2018-08-28 MED FILL — LISINOPRIL 10 MG TABLET: 10 | 90 days supply | Qty: 90 | Fill #1

## 2018-08-28 MED FILL — PANTOPRAZOLE SOD DR 40 MG T: 40 | 90 days supply | Qty: 90 | Fill #1

## 2018-08-28 MED FILL — JANUVIA 100 MG TABLET: 100 | 90 days supply | Qty: 90 | Fill #1

## 2018-08-28 MED FILL — HUMALOG 100 UNITS/ML KWIKPE: 100 | 90 days supply | Qty: 9 | Fill #0

## 2018-08-28 MED FILL — SIMVASTATIN 40 MG TABLET: 40 | 90 days supply | Qty: 90 | Fill #1

## 2018-08-28 MED FILL — ACCU-CHEK GUIDE TEST STRIP: 90 days supply | Qty: 100 | Fill #3

## 2018-08-28 MED FILL — metFORMIN HCL 1000 MG TABS: 1000 | 90 days supply | Qty: 180 | Fill #1

## 2018-09-22 MED FILL — UNIFINE PENTIPS 32GX5/32: 32G X 4 MM | 90 days supply | Qty: 300 | Fill #0

## 2018-09-22 MED FILL — UNIFINE PENTIPS 32GX5/32": 32G X 4 MM | 90 days supply | Qty: 300 | Fill #0

## 2018-10-14 MED FILL — LANTUS SOLOSTAR 100 UNITS/M: 100 | 44 days supply | Qty: 15 | Fill #1

## 2018-10-30 DIAGNOSIS — E1165 Type 2 diabetes mellitus with hyperglycemia: Secondary | ICD-10-CM | POA: Diagnosis not present

## 2018-10-30 DIAGNOSIS — E7800 Pure hypercholesterolemia, unspecified: Secondary | ICD-10-CM | POA: Diagnosis not present

## 2018-10-30 DIAGNOSIS — Z720 Tobacco use: Secondary | ICD-10-CM | POA: Diagnosis not present

## 2018-10-30 DIAGNOSIS — K219 Gastro-esophageal reflux disease without esophagitis: Secondary | ICD-10-CM | POA: Diagnosis not present

## 2018-10-30 DIAGNOSIS — I10 Essential (primary) hypertension: Secondary | ICD-10-CM | POA: Diagnosis not present

## 2018-10-30 DIAGNOSIS — Z794 Long term (current) use of insulin: Secondary | ICD-10-CM | POA: Diagnosis not present

## 2018-11-05 DIAGNOSIS — I10 Essential (primary) hypertension: Secondary | ICD-10-CM | POA: Diagnosis not present

## 2018-11-05 DIAGNOSIS — E1165 Type 2 diabetes mellitus with hyperglycemia: Secondary | ICD-10-CM | POA: Diagnosis not present

## 2018-11-05 DIAGNOSIS — Z7984 Long term (current) use of oral hypoglycemic drugs: Secondary | ICD-10-CM | POA: Diagnosis not present

## 2018-11-05 DIAGNOSIS — E7800 Pure hypercholesterolemia, unspecified: Secondary | ICD-10-CM | POA: Diagnosis not present

## 2018-11-06 DIAGNOSIS — Z794 Long term (current) use of insulin: Secondary | ICD-10-CM | POA: Diagnosis not present

## 2018-11-06 DIAGNOSIS — E119 Type 2 diabetes mellitus without complications: Secondary | ICD-10-CM | POA: Diagnosis not present

## 2018-11-06 DIAGNOSIS — H524 Presbyopia: Secondary | ICD-10-CM | POA: Diagnosis not present

## 2018-11-06 DIAGNOSIS — H2513 Age-related nuclear cataract, bilateral: Secondary | ICD-10-CM | POA: Diagnosis not present

## 2018-11-11 MED FILL — JARDIANCE 10 MG TABLET: 10 | 30 days supply | Qty: 30 | Fill #0

## 2018-11-18 MED FILL — ACCU-CHEK GUIDE TEST STRIP: 90 days supply | Qty: 100 | Fill #0

## 2018-11-18 MED FILL — HUMALOG 100 UNITS/ML KWIKPE: 100 | 90 days supply | Qty: 18 | Fill #0

## 2018-11-18 MED FILL — LANTUS SOLOSTAR 100 UNITS/M: 100 | 85 days supply | Qty: 30 | Fill #0

## 2018-11-26 MED FILL — FLUCONAZOLE 150 MG TABS: 150 | 5 days supply | Qty: 2 | Fill #0

## 2018-12-05 MED FILL — JARDIANCE 10 MG TABLET: 10 | 90 days supply | Qty: 90 | Fill #0

## 2019-01-20 MED FILL — metFORMIN HCL 1000 MG TABS: 1000 | 90 days supply | Qty: 180 | Fill #0

## 2019-01-20 MED FILL — LISINOPRIL 10 MG TABS: 10 | 90 days supply | Qty: 90 | Fill #0

## 2019-01-20 MED FILL — UNIFINE PENTIPS 32GX5/32": 32G X 4 MM | 90 days supply | Qty: 300 | Fill #1

## 2019-01-20 MED FILL — UNIFINE PENTIPS 32GX5/32: 32G X 4 MM | 90 days supply | Qty: 300 | Fill #1

## 2019-01-20 MED FILL — SIMVASTATIN 40 MG TABLET: 40 | 90 days supply | Qty: 90 | Fill #0

## 2019-01-29 MED FILL — ACCU-CHEK GUIDE TEST STRIP: 90 days supply | Qty: 100 | Fill #0

## 2019-02-10 ENCOUNTER — Other Ambulatory Visit: Payer: Self-pay | Admitting: Physician Assistant

## 2019-02-10 DIAGNOSIS — Z1231 Encounter for screening mammogram for malignant neoplasm of breast: Secondary | ICD-10-CM

## 2019-02-12 DIAGNOSIS — E1165 Type 2 diabetes mellitus with hyperglycemia: Secondary | ICD-10-CM | POA: Diagnosis not present

## 2019-02-12 DIAGNOSIS — Z794 Long term (current) use of insulin: Secondary | ICD-10-CM | POA: Diagnosis not present

## 2019-02-26 MED FILL — LANTUS SOLOSTAR 100 UNITS/M: 100 | 42 days supply | Qty: 15 | Fill #1

## 2019-02-26 MED FILL — HUMALOG 100 UNITS/ML KWIKPE: 100 | 90 days supply | Qty: 18 | Fill #1

## 2019-02-26 MED FILL — JARDIANCE 10 MG TABLET: 10 | 90 days supply | Qty: 90 | Fill #1

## 2019-03-24 ENCOUNTER — Ambulatory Visit
Admission: RE | Admit: 2019-03-24 | Discharge: 2019-03-24 | Disposition: A | Discharge status: Home / Self Care | Payer: 59 | Source: Ambulatory Visit | Attending: Physician Assistant | Admitting: Physician Assistant

## 2019-03-24 ENCOUNTER — Other Ambulatory Visit: Payer: Self-pay

## 2019-03-24 DIAGNOSIS — Z1231 Encounter for screening mammogram for malignant neoplasm of breast: Secondary | ICD-10-CM | POA: Diagnosis not present

## 2019-04-16 MED FILL — metFORMIN HCL 1000 MG TABS: 1000 | 90 days supply | Qty: 180 | Fill #1

## 2019-04-16 MED FILL — SIMVASTATIN 40 MG TABLET: 40 | 90 days supply | Qty: 90 | Fill #1

## 2019-04-16 MED FILL — LISINOPRIL 10 MG TABS: 10 | 90 days supply | Qty: 90 | Fill #1

## 2019-04-22 MED FILL — LANTUS SOLOSTAR 100 UNITS/M: 100 | 84 days supply | Qty: 30 | Fill #0

## 2019-04-22 MED FILL — UNIFINE PENTIPS 32GX5/32: 32G X 4 MM | 90 days supply | Qty: 300 | Fill #0

## 2019-04-23 DIAGNOSIS — M79602 Pain in left arm: Secondary | ICD-10-CM | POA: Diagnosis not present

## 2019-04-27 MED FILL — ACCU-CHEK GUIDE TEST STRIP: 90 days supply | Qty: 100 | Fill #1

## 2019-05-04 DIAGNOSIS — Z1211 Encounter for screening for malignant neoplasm of colon: Secondary | ICD-10-CM | POA: Diagnosis not present

## 2019-05-04 DIAGNOSIS — Z0000 Encounter for general adult medical examination without abnormal findings: Secondary | ICD-10-CM | POA: Diagnosis not present

## 2019-05-04 DIAGNOSIS — M25522 Pain in left elbow: Secondary | ICD-10-CM | POA: Diagnosis not present

## 2019-05-04 DIAGNOSIS — E1169 Type 2 diabetes mellitus with other specified complication: Secondary | ICD-10-CM | POA: Diagnosis not present

## 2019-05-04 DIAGNOSIS — Z720 Tobacco use: Secondary | ICD-10-CM | POA: Diagnosis not present

## 2019-05-04 DIAGNOSIS — K219 Gastro-esophageal reflux disease without esophagitis: Secondary | ICD-10-CM | POA: Diagnosis not present

## 2019-05-04 DIAGNOSIS — I10 Essential (primary) hypertension: Secondary | ICD-10-CM | POA: Diagnosis not present

## 2019-05-04 DIAGNOSIS — E7800 Pure hypercholesterolemia, unspecified: Secondary | ICD-10-CM | POA: Diagnosis not present

## 2019-05-04 MED FILL — MELOXICAM 15 MG TABLET: 15 | 30 days supply | Qty: 30 | Fill #0

## 2019-05-18 ENCOUNTER — Encounter (HOSPITAL_COMMUNITY): Payer: Self-pay

## 2019-05-18 ENCOUNTER — Other Ambulatory Visit: Payer: Self-pay

## 2019-05-18 ENCOUNTER — Ambulatory Visit (HOSPITAL_COMMUNITY)
Admission: EM | Admit: 2019-05-18 | Discharge: 2019-05-18 | Disposition: A | Discharge status: Home / Self Care | Payer: 59 | Attending: Family Medicine | Admitting: Family Medicine

## 2019-05-18 DIAGNOSIS — R0981 Nasal congestion: Secondary | ICD-10-CM | POA: Diagnosis not present

## 2019-05-18 DIAGNOSIS — Z8249 Family history of ischemic heart disease and other diseases of the circulatory system: Secondary | ICD-10-CM | POA: Insufficient documentation

## 2019-05-18 DIAGNOSIS — J029 Acute pharyngitis, unspecified: Secondary | ICD-10-CM

## 2019-05-18 DIAGNOSIS — J45909 Unspecified asthma, uncomplicated: Secondary | ICD-10-CM | POA: Insufficient documentation

## 2019-05-18 DIAGNOSIS — F17210 Nicotine dependence, cigarettes, uncomplicated: Secondary | ICD-10-CM | POA: Diagnosis not present

## 2019-05-18 DIAGNOSIS — J069 Acute upper respiratory infection, unspecified: Secondary | ICD-10-CM

## 2019-05-18 DIAGNOSIS — E785 Hyperlipidemia, unspecified: Secondary | ICD-10-CM | POA: Insufficient documentation

## 2019-05-18 DIAGNOSIS — Z7982 Long term (current) use of aspirin: Secondary | ICD-10-CM | POA: Insufficient documentation

## 2019-05-18 DIAGNOSIS — Z882 Allergy status to sulfonamides status: Secondary | ICD-10-CM | POA: Insufficient documentation

## 2019-05-18 DIAGNOSIS — Z79899 Other long term (current) drug therapy: Secondary | ICD-10-CM | POA: Insufficient documentation

## 2019-05-18 DIAGNOSIS — B9789 Other viral agents as the cause of diseases classified elsewhere: Secondary | ICD-10-CM

## 2019-05-18 DIAGNOSIS — Z20828 Contact with and (suspected) exposure to other viral communicable diseases: Secondary | ICD-10-CM | POA: Diagnosis not present

## 2019-05-18 DIAGNOSIS — Z794 Long term (current) use of insulin: Secondary | ICD-10-CM | POA: Diagnosis not present

## 2019-05-18 DIAGNOSIS — E119 Type 2 diabetes mellitus without complications: Secondary | ICD-10-CM | POA: Insufficient documentation

## 2019-05-18 DIAGNOSIS — K219 Gastro-esophageal reflux disease without esophagitis: Secondary | ICD-10-CM | POA: Diagnosis not present

## 2019-05-18 DIAGNOSIS — I10 Essential (primary) hypertension: Secondary | ICD-10-CM | POA: Insufficient documentation

## 2019-05-18 LAB — POCT RAPID STREP A: Streptococcus, Group A Screen (Direct): NEGATIVE

## 2019-05-18 LAB — POC SARS CORONAVIRUS 2 AG -  ED: SARS Coronavirus 2 Ag: NEGATIVE

## 2019-05-18 LAB — POC SARS CORONAVIRUS 2 AG: SARS Coronavirus 2 Ag: NEGATIVE

## 2019-05-18 MED ORDER — CETIRIZINE HCL 10 MG PO TABS: 10.0000 mg | ORAL_TABLET | Freq: Every day | ORAL | 0 refills | Status: AC

## 2019-05-18 MED ORDER — FLUTICASONE PROPIONATE 50 MCG/ACT NA SUSP: 1.0000 | Freq: Every day | NASAL | 2 refills | Status: AC

## 2019-05-18 NOTE — ED Triage Notes (Signed)
Pt presents with sore throat X 5 days.

## 2019-05-18 NOTE — Discharge Instructions (Addendum)
Your strep test is negative Rapid covid test is negative  This is most likely a viral upper respiratory infection.  You can do over-the-counter medication as needed for your symptoms Flonase and Zyrtec sent to the pharmacy Follow up as needed for continued or worsening symptoms

## 2019-05-19 NOTE — ED Provider Notes (Signed)
[place]    CSN: [id number] Arrival date & time: 05/18/19  1445      History   Chief Complaint Chief Complaint  Patient presents with  . Sore Throat    HPI Brandy Burns is a 58 y.o. female.   Pt is a 58 year old female that presents with sore throat, nasal congestion, ear fulness  and rhinorrhea x 3 days. symptoms have been constant. She has been using OTC cold medication with some relief. No trouble swallowing. No associated cough, chest congestion, fever, body aches, chills. Works at the hospital. No known sick exposures.   ROS per HPI      Past Medical History:  Diagnosis Date  . Anemia    as a child  . Asthma   . Diabetes mellitus    type 2  . Essential hypertension 06/16/2014  . GERD (gastroesophageal reflux disease)   . Hyperlipemia   . Kidney stones     Patient Active Problem List   Diagnosis Date Noted  . Distal radius fracture, left 06/21/2015  . Hyperlipidemia 06/16/2014  . Essential hypertension 06/16/2014  . Diabetes mellitus type 2, insulin dependent ([place]) 03/24/2014    Past Surgical History:  Procedure Laterality Date  . COLONOSCOPY  09/23/2011   Procedure: COLONOSCOPY;  Surgeon: Juanita Craver, MD;  Location: [facility];  Service: Endoscopy;  Laterality: N/A;  . ECTOPIC PREGNANCY SURGERY    . OPEN REDUCTION INTERNAL FIXATION (ORIF) DISTAL RADIAL FRACTURE Left 06/21/2015   Procedure: OPEN REDUCTION INTERNAL FIXATION (ORIF) DISTAL RADIAL FRACTURE AND REPAIR AS NEEDED;  Surgeon: Iran Planas, MD;  Location: [facility];  Service: Orthopedics;  Laterality: Left;  . TONSILLECTOMY    . TUBAL LIGATION      OB History   No obstetric history on file.      Home Medications    Prior to Admission medications   Medication Sig Start Date End Date Taking? Authorizing Provider  aspirin 81 MG tablet Take 81 mg by mouth daily.    [provider]  cetirizine (ZYRTEC) 10 MG tablet Take 1 tablet (10 mg total) by mouth daily. 05/18/19    Lequita Meadowcroft, Tressia Miners A, NP  fluticasone (FLONASE) 50 MCG/ACT nasal spray Place 1 spray into both nostrils daily. 05/18/19   Loura Halt A, NP  HYDROcodone-acetaminophen (NORCO) 5-325 MG tablet Take 1 tablet by mouth every 6 (six) hours as needed for moderate pain. 06/04/16   Robyn Haber, MD  HYDROcodone-homatropine (HYDROMET) 5-1.5 MG/5ML syrup Take 5 mLs by mouth every 6 (six) hours as needed for cough. 06/07/18   Robyn Haber, MD  ibuprofen (ADVIL,MOTRIN) 800 MG tablet Take 1 tablet (800 mg total) by mouth 3 (three) times daily. 12/13/16   Lysbeth Penner, FNP  insulin aspart (NOVOLOG FLEXPEN) 100 UNIT/ML FlexPen Inject 10 Units into the skin every evening.    [provider]  insulin glargine (LANTUS) 100 UNIT/ML injection Inject 34 Units into the skin daily.     [provider]  lisinopril (PRINIVIL,ZESTRIL) 10 MG tablet Take 10 mg by mouth daily.    [provider]  metFORMIN (GLUCOPHAGE) 1000 MG tablet Take 1,000 mg by mouth 2 (two) times daily with a meal.     [provider]  pantoprazole (PROTONIX) 40 MG tablet Take 40 mg by mouth daily.     [provider]  predniSONE (DELTASONE) 50 MG tablet One daily with food 06/07/18   Robyn Haber, MD  simvastatin (ZOCOR) 40 MG tablet Take 40 mg  by mouth every evening.    [provider]  sitaGLIPtin (JANUVIA) 100 MG tablet Take 100 mg by mouth daily.    [provider]    Family History Family History  Problem Relation Age of Onset  . COPD Mother   . Diabetes Mother   . Hypertension Mother   . Cancer Father   . Diabetes Brother   . Hypertension Brother   . Hypertension Other   . Hyperlipidemia Other   . Arthritis Other   . COPD Other   . Obesity Other     Social History Social History   Tobacco Use  . Smoking status: Current Every Day Smoker    Packs/day: 0.50    Years: 0.00    Pack years: 0.00    Types: Cigarettes  . Smokeless tobacco: Never Used  Substance Use  Topics  . Alcohol use: No  . Drug use: No     Allergies   Sulfa antibiotics   Review of Systems Review of Systems   Physical Exam Triage Vital Signs ED Triage Vitals  Enc Vitals Group     BP 05/18/19 1546 105/69     Pulse Rate 05/18/19 1546 63     Resp 05/18/19 1546 17     Temp 05/18/19 1546 98.3 F (36.8 C)     Temp Source 05/18/19 1546 Oral     SpO2 05/18/19 1546 100 %     Weight --      Height --      Head Circumference --      Peak Flow --      Pain Score 05/18/19 1547 5     Pain Loc --      Pain Edu? --      Excl. in [place]? --    No data found.  Updated Vital Signs BP 105/69 (BP Location: Left Arm)   Pulse 63   Temp 98.3 F (36.8 C) (Oral)   Resp 17   SpO2 100%   Visual Acuity Right Eye Distance:   Left Eye Distance:   Bilateral Distance:    Right Eye Near:   Left Eye Near:    Bilateral Near:     Physical Exam Vitals signs and nursing note reviewed.  Constitutional:      General: She is not in acute distress.    Appearance: She is well-developed. She is not ill-appearing, toxic-appearing or diaphoretic.  HENT:     Head: Normocephalic and atraumatic.     Right Ear: Tympanic membrane and ear canal normal.     Left Ear: Tympanic membrane and ear canal normal.     Nose: Congestion present.     Mouth/Throat:     Pharynx: Oropharynx is clear. Uvula midline. Posterior oropharyngeal erythema present.     Tonsils: No tonsillar exudate. 0 on the right. 0 on the left.  Eyes:     Conjunctiva/sclera: Conjunctivae normal.  Neck:     Musculoskeletal: Normal range of motion.  Cardiovascular:     Rate and Rhythm: Normal rate.     Heart sounds: Normal heart sounds.  Pulmonary:     Effort: Pulmonary effort is normal.     Breath sounds: Normal breath sounds.  Lymphadenopathy:     Cervical: No cervical adenopathy.  Skin:    General: Skin is warm and dry.  Neurological:     Mental Status: She is alert.  Psychiatric:        Mood and Affect: Mood normal.  UC Treatments / Results  Labs (all labs ordered are listed, but only abnormal results are displayed) Labs Reviewed  NOVEL CORONAVIRUS, NAA (HOSP ORDER, SEND-OUT TO REF LAB; TAT 18-24 HRS)  CULTURE, GROUP A STREP ([place])  POC SARS CORONAVIRUS 2 AG -  ED  POCT RAPID STREP A  POC SARS CORONAVIRUS 2 AG    EKG   Radiology No results found.  Procedures Procedures (including critical care time)  Medications Ordered in UC Medications - No data to display  Initial Impression / Assessment and Plan / UC Course  I have reviewed the triage vital signs and the nursing notes.  Pertinent labs & imaging results that were available during my care of the patient were reviewed by me and considered in my medical decision making (see chart for details).     Viral URI- Rapid COVID and rapid strep test negative.  Most likely viral URI OTC meds as needed for symptoms.  Follow up as needed for continued or worsening symptoms' Final Clinical Impressions(s) / UC Diagnoses   Final diagnoses:  Viral URI     Discharge Instructions     Your strep test is negative Rapid covid test is negative  This is most likely a viral upper respiratory infection.  You can do over-the-counter medication as needed for your symptoms Flonase and Zyrtec sent to the pharmacy Follow up as needed for continued or worsening symptoms       ED Prescriptions    Medication Sig Dispense Auth. Provider   fluticasone (FLONASE) 50 MCG/ACT nasal spray Place 1 spray into both nostrils daily. 16 g Maxfield Gildersleeve A, NP   cetirizine (ZYRTEC) 10 MG tablet Take 1 tablet (10 mg total) by mouth daily. 30 tablet Loura Halt A, NP     PDMP not reviewed this encounter.   Loura Halt A, NP 05/19/19 [phone]

## 2019-05-20 DIAGNOSIS — Z01419 Encounter for gynecological examination (general) (routine) without abnormal findings: Secondary | ICD-10-CM | POA: Diagnosis not present

## 2019-05-20 LAB — NOVEL CORONAVIRUS, NAA (HOSP ORDER, SEND-OUT TO REF LAB; TAT 18-24 HRS): SARS-CoV-2, NAA: NOT DETECTED

## 2019-05-21 LAB — CULTURE, GROUP A STREP (THRC)

## 2019-06-14 MED FILL — JARDIANCE 10 MG TABLET: 10 | 60 days supply | Qty: 60 | Fill #1

## 2019-06-14 MED FILL — HUMALOG 100 UNITS/ML KWIKPE: 100 | 90 days supply | Qty: 18 | Fill #2

## 2019-07-19 MED FILL — LANTUS SOLOSTAR 100 UNITS/M: 100 | 85 days supply | Qty: 30 | Fill #0

## 2019-07-19 MED FILL — UNIFINE PENTIPS 32GX5/32": 32G X 4 MM | 90 days supply | Qty: 300 | Fill #1

## 2019-07-19 MED FILL — SIMVASTATIN 40 MG TABLET: 40 | 90 days supply | Qty: 90 | Fill #0

## 2019-07-19 MED FILL — LISINOPRIL 10 MG TABS: 10 | 90 days supply | Qty: 90 | Fill #0

## 2019-07-19 MED FILL — metFORMIN HCL 1000 MG TABS: 1000 | 90 days supply | Qty: 180 | Fill #0

## 2019-07-19 MED FILL — UNIFINE PENTIPS 32GX5/32: 32G X 4 MM | 90 days supply | Qty: 300 | Fill #1

## 2019-08-19 MED FILL — JARDIANCE 10 MG TABLET: 10 | 30 days supply | Qty: 30 | Fill #0

## 2019-09-03 MED FILL — TRESIBA FLEXTOUCH 200 UNITS: 200 | 52 days supply | Qty: 9 | Fill #0

## 2019-09-16 MED FILL — JARDIANCE 10 MG TABLET: 10 | 30 days supply | Qty: 30 | Fill #0

## 2019-09-28 MED FILL — FREESTYLE LITE METER: 1 days supply | Qty: 1 | Fill #0

## 2019-09-28 MED FILL — FREESTYLE LANCETS: 90 days supply | Qty: 100 | Fill #0

## 2019-09-28 MED FILL — FREESTYLE LITE TEST STRIP: 90 days supply | Qty: 100 | Fill #0

## 2019-10-15 MED FILL — METFORMIN HCL 1000 MG TABS: 1000 | 90 days supply | Qty: 180 | Fill #1

## 2019-10-15 MED FILL — SIMVASTATIN 40 MG TABLET: 40 | 90 days supply | Qty: 90 | Fill #1

## 2019-10-15 MED FILL — LISINOPRIL 10 MG TABS: 10 | 90 days supply | Qty: 90 | Fill #1

## 2019-10-15 MED FILL — JARDIANCE 10 MG TABLET: 10 | 30 days supply | Qty: 30 | Fill #1

## 2019-10-15 MED FILL — HUMALOG 100 UNITS/ML KWIKPE: 100 | 90 days supply | Qty: 18 | Fill #0

## 2019-11-01 ENCOUNTER — Other Ambulatory Visit (HOSPITAL_COMMUNITY): Payer: Self-pay | Admitting: Physician Assistant

## 2019-11-01 DIAGNOSIS — E1169 Type 2 diabetes mellitus with other specified complication: Secondary | ICD-10-CM | POA: Diagnosis not present

## 2019-11-01 DIAGNOSIS — Z794 Long term (current) use of insulin: Secondary | ICD-10-CM | POA: Diagnosis not present

## 2019-11-01 DIAGNOSIS — E7800 Pure hypercholesterolemia, unspecified: Secondary | ICD-10-CM | POA: Diagnosis not present

## 2019-11-01 DIAGNOSIS — I10 Essential (primary) hypertension: Secondary | ICD-10-CM | POA: Diagnosis not present

## 2019-11-01 DIAGNOSIS — F17200 Nicotine dependence, unspecified, uncomplicated: Secondary | ICD-10-CM | POA: Diagnosis not present

## 2019-11-01 DIAGNOSIS — K219 Gastro-esophageal reflux disease without esophagitis: Secondary | ICD-10-CM | POA: Diagnosis not present

## 2019-11-03 DIAGNOSIS — N390 Urinary tract infection, site not specified: Secondary | ICD-10-CM | POA: Diagnosis not present

## 2019-11-03 DIAGNOSIS — R319 Hematuria, unspecified: Secondary | ICD-10-CM | POA: Diagnosis not present

## 2019-11-03 DIAGNOSIS — E1169 Type 2 diabetes mellitus with other specified complication: Secondary | ICD-10-CM | POA: Diagnosis not present

## 2019-11-03 MED FILL — CIPROFLOXACIN HCL 500 MG TA: 500 | 10 days supply | Qty: 20 | Fill #0

## 2019-11-05 ENCOUNTER — Emergency Department (HOSPITAL_COMMUNITY)
Admission: EM | Admit: 2019-11-05 | Discharge: 2019-11-05 | Disposition: A | Discharge status: Home / Self Care | Payer: 59 | Attending: Emergency Medicine | Admitting: Emergency Medicine

## 2019-11-05 ENCOUNTER — Other Ambulatory Visit: Payer: Self-pay

## 2019-11-05 ENCOUNTER — Encounter (HOSPITAL_COMMUNITY): Payer: Self-pay | Admitting: Emergency Medicine

## 2019-11-05 DIAGNOSIS — Z5321 Procedure and treatment not carried out due to patient leaving prior to being seen by health care provider: Secondary | ICD-10-CM | POA: Insufficient documentation

## 2019-11-05 DIAGNOSIS — R110 Nausea: Secondary | ICD-10-CM | POA: Diagnosis not present

## 2019-11-05 DIAGNOSIS — R519 Headache, unspecified: Secondary | ICD-10-CM | POA: Insufficient documentation

## 2019-11-05 NOTE — ED Triage Notes (Signed)
Pt started having urinary problems on Monday. Was seen at PCP Tuesday and started on antibiotics. Reports since having headaches and feeling nauseated started Tuesday but got worse since Wed.

## 2019-11-15 DIAGNOSIS — E119 Type 2 diabetes mellitus without complications: Secondary | ICD-10-CM | POA: Diagnosis not present

## 2019-11-15 DIAGNOSIS — Z794 Long term (current) use of insulin: Secondary | ICD-10-CM | POA: Diagnosis not present

## 2019-11-15 DIAGNOSIS — H524 Presbyopia: Secondary | ICD-10-CM | POA: Diagnosis not present

## 2019-11-15 DIAGNOSIS — H2513 Age-related nuclear cataract, bilateral: Secondary | ICD-10-CM | POA: Diagnosis not present

## 2019-12-06 MED FILL — TRESIBA FLEXTOUCH 200 UNITS: 200 | 52 days supply | Qty: 9 | Fill #1

## 2019-12-06 MED FILL — JARDIANCE 10 MG TABLET: 10 | 90 days supply | Qty: 90 | Fill #0

## 2020-02-04 MED FILL — TRESIBA FLEXTOUCH 200 UNITS: 200 | 52 days supply | Qty: 9 | Fill #2

## 2020-02-04 MED FILL — HUMALOG 100 UNITS/ML KWIKPE: 100 | 90 days supply | Qty: 18 | Fill #1

## 2020-02-04 MED FILL — LISINOPRIL 10 MG TABS: 10 | 90 days supply | Qty: 90 | Fill #0

## 2020-02-04 MED FILL — METFORMIN HCL 1000 MG TABS: 1000 | 90 days supply | Qty: 180 | Fill #0

## 2020-02-04 MED FILL — SIMVASTATIN 40 MG TABLET: 40 | 90 days supply | Qty: 90 | Fill #0

## 2020-03-08 MED FILL — JARDIANCE 10 MG TABLET: 10 | 90 days supply | Qty: 90 | Fill #1

## 2020-03-29 MED FILL — TRESIBA FLEXTOUCH 200 UNITS: 200 | 52 days supply | Qty: 9 | Fill #3

## 2020-05-02 MED FILL — SIMVASTATIN 40 MG TABLET: 40 | 90 days supply | Qty: 90 | Fill #1

## 2020-05-02 MED FILL — HUMALOG 100 UNITS/ML KWIKPE: 100 | 90 days supply | Qty: 18 | Fill #0

## 2020-05-02 MED FILL — METFORMIN HCL 1000 MG TABS: 1000 | 90 days supply | Qty: 180 | Fill #1

## 2020-05-02 MED FILL — LISINOPRIL 10 MG TABS: 10 | 90 days supply | Qty: 90 | Fill #1

## 2020-05-23 DIAGNOSIS — E1169 Type 2 diabetes mellitus with other specified complication: Secondary | ICD-10-CM | POA: Diagnosis not present

## 2020-05-23 DIAGNOSIS — I10 Essential (primary) hypertension: Secondary | ICD-10-CM | POA: Diagnosis not present

## 2020-05-23 DIAGNOSIS — K219 Gastro-esophageal reflux disease without esophagitis: Secondary | ICD-10-CM | POA: Diagnosis not present

## 2020-05-23 DIAGNOSIS — Z1211 Encounter for screening for malignant neoplasm of colon: Secondary | ICD-10-CM | POA: Diagnosis not present

## 2020-05-23 DIAGNOSIS — Z0000 Encounter for general adult medical examination without abnormal findings: Secondary | ICD-10-CM | POA: Diagnosis not present

## 2020-05-23 DIAGNOSIS — F17200 Nicotine dependence, unspecified, uncomplicated: Secondary | ICD-10-CM | POA: Diagnosis not present

## 2020-05-23 DIAGNOSIS — E7800 Pure hypercholesterolemia, unspecified: Secondary | ICD-10-CM | POA: Diagnosis not present

## 2020-05-24 ENCOUNTER — Other Ambulatory Visit (HOSPITAL_COMMUNITY): Payer: Self-pay | Admitting: Physician Assistant

## 2020-05-24 MED FILL — VIT D2 1.25 MG (50,000 UNIT: 1.25 MG | 28 days supply | Qty: 4 | Fill #0

## 2020-05-26 MED FILL — AMOXICILLIN 500 MG CAPSULE: 500 | 7 days supply | Qty: 21 | Fill #0

## 2020-05-31 ENCOUNTER — Other Ambulatory Visit (HOSPITAL_COMMUNITY): Payer: Self-pay | Admitting: General Practice

## 2020-06-01 MED FILL — AMOXICILLIN 500 MG CAPSULE: 500 | 7 days supply | Qty: 21 | Fill #0

## 2020-06-23 ENCOUNTER — Other Ambulatory Visit (HOSPITAL_COMMUNITY): Payer: Self-pay | Admitting: Physician Assistant

## 2020-06-23 MED FILL — JARDIANCE 10 MG TABLET: 10 | 90 days supply | Qty: 90 | Fill #0

## 2020-06-23 MED FILL — VIT D2 1.25 MG (50,000 UNIT: 1.25 MG | 28 days supply | Qty: 4 | Fill #1

## 2020-06-23 MED FILL — TRESIBA FLEXTOUCH 200 UNITS: 200 | 51 days supply | Qty: 9 | Fill #0

## 2020-07-12 ENCOUNTER — Other Ambulatory Visit (HOSPITAL_COMMUNITY): Payer: Self-pay | Admitting: Obstetrics and Gynecology

## 2020-07-12 DIAGNOSIS — B372 Candidiasis of skin and nail: Secondary | ICD-10-CM | POA: Diagnosis not present

## 2020-07-12 DIAGNOSIS — Z01419 Encounter for gynecological examination (general) (routine) without abnormal findings: Secondary | ICD-10-CM | POA: Diagnosis not present

## 2020-07-12 MED FILL — KETOCONAZOLE 2 % CREA: 2 | 30 days supply | Qty: 60 | Fill #0

## 2020-07-31 ENCOUNTER — Other Ambulatory Visit (HOSPITAL_COMMUNITY): Payer: Self-pay | Admitting: Physician Assistant

## 2020-07-31 MED FILL — VIT D2 1.25 MG (50,000 UNIT: 1.25 MG | 28 days supply | Qty: 4 | Fill #2

## 2020-07-31 MED FILL — METFORMIN HCL 1000 MG TABS: 1000 | 90 days supply | Qty: 180 | Fill #0

## 2020-07-31 MED FILL — SIMVASTATIN 40 MG TABLET: 40 | 90 days supply | Qty: 90 | Fill #0

## 2020-07-31 MED FILL — LISINOPRIL 10 MG TABS: 10 | 90 days supply | Qty: 90 | Fill #0

## 2020-07-31 MED FILL — HUMALOG 100 UNITS/ML KWIKPE: 100 | 90 days supply | Qty: 18 | Fill #1

## 2020-08-15 DIAGNOSIS — E1165 Type 2 diabetes mellitus with hyperglycemia: Secondary | ICD-10-CM | POA: Diagnosis not present

## 2020-08-18 ENCOUNTER — Other Ambulatory Visit (HOSPITAL_COMMUNITY): Payer: Self-pay | Admitting: Physician Assistant

## 2020-08-18 MED FILL — TRULICITY 0.75 MG/0.5 ML PE: 0.75 | 84 days supply | Qty: 6 | Fill #0

## 2020-09-08 MED FILL — VIT D2 1.25 MG (50,000 UNIT: 1.25 MG | 28 days supply | Qty: 4 | Fill #3

## 2020-09-26 ENCOUNTER — Other Ambulatory Visit (HOSPITAL_COMMUNITY): Payer: Self-pay

## 2020-09-26 MED FILL — Insulin Degludec Soln Pen-Injector 200 Unit/ML: SUBCUTANEOUS | 50 days supply | Qty: 9 | Fill #0 | Status: AC

## 2020-09-26 MED FILL — Dulaglutide Soln Auto-injector 0.75 MG/0.5ML: SUBCUTANEOUS | 28 days supply | Qty: 2 | Fill #0 | Status: CN

## 2020-09-26 MED FILL — Empagliflozin Tab 10 MG: ORAL | 90 days supply | Qty: 90 | Fill #0 | Status: AC

## 2020-09-26 MED FILL — Ergocalciferol Cap 1.25 MG (50000 Unit): ORAL | 28 days supply | Qty: 4 | Fill #0 | Status: CN

## 2020-09-27 ENCOUNTER — Other Ambulatory Visit (HOSPITAL_COMMUNITY): Payer: Self-pay

## 2020-09-29 ENCOUNTER — Other Ambulatory Visit (HOSPITAL_COMMUNITY): Payer: Self-pay

## 2020-10-02 ENCOUNTER — Other Ambulatory Visit (HOSPITAL_COMMUNITY): Payer: Self-pay

## 2020-10-02 MED ORDER — TRULICITY 0.75 MG/0.5ML ~~LOC~~ SOAJ
SUBCUTANEOUS | 3 refills | Status: AC
  Filled 2020-10-02: qty 6, 84d supply, fill #0
  Filled 2020-11-02: qty 2, 28d supply, fill #0

## 2020-10-02 MED ORDER — TRESIBA FLEXTOUCH 200 UNIT/ML ~~LOC~~ SOPN
PEN_INJECTOR | SUBCUTANEOUS | 8 refills | Status: AC
  Filled 2020-10-02: qty 9, 25d supply, fill #0

## 2020-10-02 MED ORDER — INSULIN LISPRO (1 UNIT DIAL) 100 UNIT/ML (KWIKPEN)
PEN_INJECTOR | SUBCUTANEOUS | 2 refills | Status: AC
  Filled 2020-10-02: qty 18, 90d supply, fill #0

## 2020-10-05 ENCOUNTER — Other Ambulatory Visit (HOSPITAL_COMMUNITY): Payer: Self-pay

## 2020-10-05 MED ORDER — LISINOPRIL 10 MG PO TABS
10.0000 mg | ORAL_TABLET | Freq: Every day | ORAL | 1 refills | Status: DC
  Filled 2020-10-05: qty 90, 90d supply, fill #0
  Filled 2021-02-16: qty 90, 90d supply, fill #1

## 2020-10-05 MED ORDER — SIMVASTATIN 40 MG PO TABS
40.0000 mg | ORAL_TABLET | Freq: Every day | ORAL | 1 refills | Status: DC
  Filled 2020-10-05: qty 90, 90d supply, fill #0
  Filled 2021-02-16: qty 90, 90d supply, fill #1

## 2020-10-05 MED ORDER — METFORMIN HCL 1000 MG PO TABS
1000.0000 mg | ORAL_TABLET | Freq: Two times a day (BID) | ORAL | 1 refills | Status: DC
  Filled 2020-10-05: qty 180, 90d supply, fill #0
  Filled 2021-02-01: qty 180, 90d supply, fill #1

## 2020-10-05 MED ORDER — INSULIN LISPRO (1 UNIT DIAL) 100 UNIT/ML (KWIKPEN)
PEN_INJECTOR | SUBCUTANEOUS | 1 refills | Status: AC
  Filled 2020-10-05: qty 18, 90d supply, fill #0
  Filled 2021-01-23: qty 18, 90d supply, fill #1

## 2020-10-11 ENCOUNTER — Other Ambulatory Visit (HOSPITAL_COMMUNITY): Payer: Self-pay

## 2020-10-18 ENCOUNTER — Other Ambulatory Visit (HOSPITAL_COMMUNITY): Payer: Self-pay

## 2020-11-02 ENCOUNTER — Other Ambulatory Visit (HOSPITAL_COMMUNITY): Payer: Self-pay

## 2020-11-14 DIAGNOSIS — E1136 Type 2 diabetes mellitus with diabetic cataract: Secondary | ICD-10-CM | POA: Diagnosis not present

## 2020-11-14 DIAGNOSIS — H524 Presbyopia: Secondary | ICD-10-CM | POA: Diagnosis not present

## 2020-11-14 DIAGNOSIS — Z7984 Long term (current) use of oral hypoglycemic drugs: Secondary | ICD-10-CM | POA: Diagnosis not present

## 2020-11-14 DIAGNOSIS — Z794 Long term (current) use of insulin: Secondary | ICD-10-CM | POA: Diagnosis not present

## 2020-11-14 DIAGNOSIS — H2513 Age-related nuclear cataract, bilateral: Secondary | ICD-10-CM | POA: Diagnosis not present

## 2020-11-16 ENCOUNTER — Other Ambulatory Visit (HOSPITAL_COMMUNITY): Payer: Self-pay

## 2020-11-21 ENCOUNTER — Other Ambulatory Visit (HOSPITAL_COMMUNITY): Payer: Self-pay

## 2020-11-21 DIAGNOSIS — E1169 Type 2 diabetes mellitus with other specified complication: Secondary | ICD-10-CM | POA: Diagnosis not present

## 2020-11-21 DIAGNOSIS — E7800 Pure hypercholesterolemia, unspecified: Secondary | ICD-10-CM | POA: Diagnosis not present

## 2020-11-21 MED ORDER — RYBELSUS 3 MG PO TABS
ORAL_TABLET | ORAL | 0 refills | Status: DC
  Filled 2020-11-21: qty 30, 30d supply, fill #0

## 2020-12-13 ENCOUNTER — Other Ambulatory Visit (HOSPITAL_COMMUNITY): Payer: Self-pay

## 2020-12-14 ENCOUNTER — Other Ambulatory Visit (HOSPITAL_COMMUNITY): Payer: Self-pay

## 2020-12-14 MED ORDER — RYBELSUS 3 MG PO TABS
ORAL_TABLET | ORAL | 3 refills | Status: AC
  Filled 2020-12-14 – 2020-12-26 (×2): qty 30, 30d supply, fill #0
  Filled 2021-02-01: qty 30, 30d supply, fill #1
  Filled 2021-03-14: qty 30, 30d supply, fill #2

## 2020-12-15 ENCOUNTER — Other Ambulatory Visit (HOSPITAL_COMMUNITY): Payer: Self-pay

## 2020-12-18 ENCOUNTER — Other Ambulatory Visit (HOSPITAL_COMMUNITY): Payer: Self-pay

## 2020-12-23 ENCOUNTER — Other Ambulatory Visit (HOSPITAL_COMMUNITY): Payer: Self-pay

## 2020-12-26 ENCOUNTER — Other Ambulatory Visit (HOSPITAL_COMMUNITY): Payer: Self-pay

## 2021-01-08 ENCOUNTER — Other Ambulatory Visit (HOSPITAL_COMMUNITY): Payer: Self-pay

## 2021-01-08 MED FILL — Insulin Degludec Soln Pen-Injector 200 Unit/ML: SUBCUTANEOUS | 50 days supply | Qty: 9 | Fill #1 | Status: AC

## 2021-01-08 MED FILL — Empagliflozin Tab 10 MG: ORAL | 90 days supply | Qty: 90 | Fill #1 | Status: AC

## 2021-01-23 ENCOUNTER — Other Ambulatory Visit (HOSPITAL_COMMUNITY): Payer: Self-pay

## 2021-02-01 ENCOUNTER — Other Ambulatory Visit (HOSPITAL_COMMUNITY): Payer: Self-pay

## 2021-02-16 ENCOUNTER — Other Ambulatory Visit (HOSPITAL_COMMUNITY): Payer: Self-pay

## 2021-02-16 MED FILL — Ergocalciferol Cap 1.25 MG (50000 Unit): ORAL | 28 days supply | Qty: 4 | Fill #0 | Status: AC

## 2021-03-07 ENCOUNTER — Other Ambulatory Visit (HOSPITAL_COMMUNITY): Payer: Self-pay

## 2021-03-07 MED FILL — Insulin Degludec Soln Pen-Injector 200 Unit/ML: SUBCUTANEOUS | 50 days supply | Qty: 9 | Fill #2 | Status: AC

## 2021-03-07 MED FILL — Ergocalciferol Cap 1.25 MG (50000 Unit): ORAL | 28 days supply | Qty: 4 | Fill #1 | Status: AC

## 2021-03-14 ENCOUNTER — Other Ambulatory Visit (HOSPITAL_COMMUNITY): Payer: Self-pay

## 2021-03-27 ENCOUNTER — Other Ambulatory Visit (HOSPITAL_COMMUNITY): Payer: Self-pay

## 2021-03-27 MED ORDER — RYBELSUS 7 MG PO TABS
ORAL_TABLET | ORAL | 1 refills | Status: AC
  Filled 2021-03-27: qty 90, 90d supply, fill #0
  Filled 2021-06-18 – 2021-08-31 (×2): qty 90, 90d supply, fill #1

## 2021-03-28 ENCOUNTER — Other Ambulatory Visit (HOSPITAL_COMMUNITY): Payer: Self-pay

## 2021-03-30 ENCOUNTER — Other Ambulatory Visit (HOSPITAL_COMMUNITY): Payer: Self-pay

## 2021-03-30 ENCOUNTER — Other Ambulatory Visit: Payer: Self-pay | Admitting: Physician Assistant

## 2021-03-30 DIAGNOSIS — Z1231 Encounter for screening mammogram for malignant neoplasm of breast: Secondary | ICD-10-CM

## 2021-04-16 ENCOUNTER — Other Ambulatory Visit (HOSPITAL_COMMUNITY): Payer: Self-pay

## 2021-04-16 MED FILL — Empagliflozin Tab 10 MG: ORAL | 90 days supply | Qty: 90 | Fill #2 | Status: AC

## 2021-05-01 ENCOUNTER — Ambulatory Visit
Admission: RE | Admit: 2021-05-01 | Discharge: 2021-05-01 | Disposition: A | Discharge status: Home / Self Care | Payer: 59 | Source: Ambulatory Visit | Attending: Physician Assistant | Admitting: Physician Assistant

## 2021-05-01 ENCOUNTER — Other Ambulatory Visit: Payer: Self-pay

## 2021-05-01 DIAGNOSIS — Z1231 Encounter for screening mammogram for malignant neoplasm of breast: Secondary | ICD-10-CM

## 2021-05-17 ENCOUNTER — Other Ambulatory Visit (HOSPITAL_COMMUNITY): Payer: Self-pay

## 2021-05-17 MED ORDER — LISINOPRIL 10 MG PO TABS
ORAL_TABLET | ORAL | 0 refills | Status: AC
  Filled 2021-05-17: qty 90, 90d supply, fill #0

## 2021-05-17 MED ORDER — INSULIN LISPRO (1 UNIT DIAL) 100 UNIT/ML (KWIKPEN)
PEN_INJECTOR | SUBCUTANEOUS | 1 refills | Status: DC
  Filled 2021-05-17: qty 18, 90d supply, fill #0
  Filled 2021-11-27: qty 18, 90d supply, fill #1

## 2021-05-18 ENCOUNTER — Other Ambulatory Visit (HOSPITAL_COMMUNITY): Payer: Self-pay

## 2021-05-30 DIAGNOSIS — E7800 Pure hypercholesterolemia, unspecified: Secondary | ICD-10-CM | POA: Diagnosis not present

## 2021-05-30 DIAGNOSIS — Z0000 Encounter for general adult medical examination without abnormal findings: Secondary | ICD-10-CM | POA: Diagnosis not present

## 2021-05-30 DIAGNOSIS — E1169 Type 2 diabetes mellitus with other specified complication: Secondary | ICD-10-CM | POA: Diagnosis not present

## 2021-05-30 DIAGNOSIS — I10 Essential (primary) hypertension: Secondary | ICD-10-CM | POA: Diagnosis not present

## 2021-05-30 DIAGNOSIS — F17200 Nicotine dependence, unspecified, uncomplicated: Secondary | ICD-10-CM | POA: Diagnosis not present

## 2021-05-30 DIAGNOSIS — K219 Gastro-esophageal reflux disease without esophagitis: Secondary | ICD-10-CM | POA: Diagnosis not present

## 2021-06-07 ENCOUNTER — Ambulatory Visit: Payer: Self-pay

## 2021-06-08 ENCOUNTER — Ambulatory Visit (INDEPENDENT_AMBULATORY_CARE_PROVIDER_SITE_OTHER): Payer: 59

## 2021-06-08 ENCOUNTER — Other Ambulatory Visit (HOSPITAL_COMMUNITY): Payer: Self-pay

## 2021-06-08 ENCOUNTER — Ambulatory Visit
Admission: EM | Admit: 2021-06-08 | Discharge: 2021-06-08 | Disposition: A | Discharge status: Home / Self Care | Payer: 59 | Attending: Student | Admitting: Student

## 2021-06-08 ENCOUNTER — Other Ambulatory Visit: Payer: Self-pay

## 2021-06-08 DIAGNOSIS — M19072 Primary osteoarthritis, left ankle and foot: Secondary | ICD-10-CM | POA: Diagnosis not present

## 2021-06-08 DIAGNOSIS — M85872 Other specified disorders of bone density and structure, left ankle and foot: Secondary | ICD-10-CM | POA: Diagnosis not present

## 2021-06-08 DIAGNOSIS — R609 Edema, unspecified: Secondary | ICD-10-CM | POA: Diagnosis not present

## 2021-06-08 DIAGNOSIS — M25572 Pain in left ankle and joints of left foot: Secondary | ICD-10-CM | POA: Diagnosis not present

## 2021-06-08 DIAGNOSIS — M7732 Calcaneal spur, left foot: Secondary | ICD-10-CM | POA: Diagnosis not present

## 2021-06-08 MED ORDER — DICLOFENAC SODIUM 1 % EX GEL
4.0000 g | Freq: Four times a day (QID) | CUTANEOUS | 0 refills | Status: AC
  Filled 2021-06-08: qty 100, 6d supply, fill #0

## 2021-06-08 NOTE — ED Provider Notes (Signed)
[facility]    CSN: [id number] Arrival date & time: 06/08/21  0800      History   Chief Complaint Chief Complaint  Patient presents with   left foot and ankle edema    HPI Brandy Burns is a 60 y.o. female presenting with L foot and ankle edema x weeks. Medical history noncontributory.  Describes 3 weeks of pain over the dorsum foot and medial aspect of the left ankle, worse with ambulation.  Denies trauma but does endorse overuse as she stands and walks all day.  Denies sensation changes.  Denies history of issues with this foot/ ankle.  States it has been swelling at the end of the day, but no swelling currently. She vapes but does not smoke cigarettes. Denies recent travel, prolonged immobilization, recent surgery, recent trauma, OCP use, history of clots, history of DVT, history of PE.   HPI  Past Medical History:  Diagnosis Date   Anemia    as a child   Asthma    Diabetes mellitus    type 2   Essential hypertension 06/16/2014   GERD (gastroesophageal reflux disease)    Hyperlipemia    Kidney stones     Patient Active Problem List   Diagnosis Date Noted   Distal radius fracture, left 06/21/2015   Hyperlipidemia 06/16/2014   Essential hypertension 06/16/2014   Diabetes mellitus type 2, insulin dependent ([place]) 03/24/2014    Past Surgical History:  Procedure Laterality Date   COLONOSCOPY  09/23/2011   Procedure: COLONOSCOPY;  Surgeon: Juanita Craver, MD;  Location: [facility];  Service: Endoscopy;  Laterality: N/A;   ECTOPIC PREGNANCY SURGERY     OPEN REDUCTION INTERNAL FIXATION (ORIF) DISTAL RADIAL FRACTURE Left 06/21/2015   Procedure: OPEN REDUCTION INTERNAL FIXATION (ORIF) DISTAL RADIAL FRACTURE AND REPAIR AS NEEDED;  Surgeon: Iran Planas, MD;  Location: [facility];  Service: Orthopedics;  Laterality: Left;   TONSILLECTOMY     TUBAL LIGATION      OB History   No obstetric history on file.      Home Medications    Prior to Admission  medications   Medication Sig Start Date End Date Taking? Authorizing Provider  diclofenac Sodium (VOLTAREN) 1 % GEL Apply 4 g topically 4 (four) times daily. 06/08/21  Yes Hazel Sams, PA-C  aspirin 81 MG tablet Take 81 mg by mouth daily.    [provider]  cetirizine (ZYRTEC) 10 MG tablet Take 1 tablet (10 mg total) by mouth daily. 05/18/19   Loura Halt A, NP  Dulaglutide (TRULICITY) 2.42 PN/3.6RW SOPN inject as directed once weekly 10/02/20     Dulaglutide 0.75 MG/0.5ML SOPN INJECT 0.7MG  UNDER THE SKIN ONCE A WEEK AS DIRECTED 08/18/20 08/18/21  Redmon, Noelle, PA  empagliflozin (JARDIANCE) 10 MG TABS tablet TAKE 1 TABLET BY MOUTH DAILY 06/23/20 07/15/21  Redmon, Noelle, PA  fluticasone (FLONASE) 50 MCG/ACT nasal spray Place 1 spray into both nostrils daily. 05/18/19   Loura Halt A, NP  HYDROcodone-acetaminophen (NORCO) 5-325 MG tablet Take 1 tablet by mouth every 6 (six) hours as needed for moderate pain. 06/04/16   Robyn Haber, MD  HYDROcodone-homatropine (HYDROMET) 5-1.5 MG/5ML syrup Take 5 mLs by mouth every 6 (six) hours as needed for cough. 06/07/18   Robyn Haber, MD  ibuprofen (ADVIL,MOTRIN) 800 MG tablet Take 1 tablet (800 mg total) by mouth 3 (three) times daily. 12/13/16   Lysbeth Penner, FNP  insulin aspart (NOVOLOG FLEXPEN) 100 UNIT/ML FlexPen Inject 10 Units into  the skin every evening.    [provider]  insulin degludec (TRESIBA FLEXTOUCH) 200 UNIT/ML FlexTouch Pen INJECT 35 UNITS UNDER THE SKIN ONCE DAILY 10/02/20     insulin degludec (TRESIBA) 200 UNIT/ML FlexTouch Pen INJECT 35 UNITS UNDER THE SKIN ONCE DAILY 06/23/20 06/23/21  Redmon, Noelle, PA  insulin glargine (LANTUS) 100 UNIT/ML injection Inject 34 Units into the skin daily.     [provider]  insulin lispro (HUMALOG KWIKPEN) 100 UNIT/ML KwikPen Inject 10 units subcutaneous twice a day:  before breakfast and dinner 05/17/21     insulin lispro (HUMALOG) 100 UNIT/ML KwikPen INJECT 10 UNITS  UNDER THE SKIN WITH BREAKFAST AND DINNER 11/01/19 10/31/20  Redmon, Barth Kirks, PA  insulin lispro (HUMALOG) 100 UNIT/ML KwikPen Inject 10 units twice a day before breakfast and dinner 10/02/20     insulin lispro (HUMALOG) 100 UNIT/ML KwikPen Inject 10 units subcutaneously twice a day, before breakfast and dinner 10/05/20     ketoconazole (NIZORAL) 2 % cream APPLY TO THE AFFECTED AREAS TWICE DAILY 07/12/20 07/12/21  Bobbye Charleston, MD  lisinopril (PRINIVIL,ZESTRIL) 10 MG tablet Take 10 mg by mouth daily.    [provider]  lisinopril (ZESTRIL) 10 MG tablet TAKE 1 TABLET BY MOUTH ONCE DAILY FOR KIDNEY PROTECTION 11/01/19 10/31/20  Redmon, Barth Kirks, PA  lisinopril (ZESTRIL) 10 MG tablet Take 1 tablet (10 mg total) by mouth daily. 10/05/20     lisinopril (ZESTRIL) 10 MG tablet Take 1 tablet by mouth once a day for kidney protection 05/17/21     metFORMIN (GLUCOPHAGE) 1000 MG tablet Take 1,000 mg by mouth 2 (two) times daily with a meal.     [provider]  metFORMIN (GLUCOPHAGE) 1000 MG tablet TAKE 1 TABLET BY MOUTH TWICE DAILY WITH A MEAL 07/31/20 07/31/21  Redmon, Noelle, PA  metFORMIN (GLUCOPHAGE) 1000 MG tablet TAKE 1 TABLET BY MOUTH TWICE DAILY WITH A MEAL 11/01/19 10/31/20  Redmon, Barth Kirks, PA  metFORMIN (GLUCOPHAGE) 1000 MG tablet Take 1 tablet (1,000 mg total) by mouth 2 (two) times daily. 10/05/20     Semaglutide (RYBELSUS) 3 MG TABS Take one tablet by mouth at least 30 minutes before first food, beverage or other oral medicine of the day. 12/14/20     Semaglutide (RYBELSUS) 7 MG TABS Take 1 tablet by mouth at least 30 minutes before first food, beverage or other oral medicine of the day. 03/27/21     simvastatin (ZOCOR) 40 MG tablet Take 40 mg by mouth every evening.    [provider]  simvastatin (ZOCOR) 40 MG tablet TAKE 1 TABLET BY MOUTH EVERY EVENING 07/31/20 07/31/21  Redmon, Barth Kirks, PA  simvastatin (ZOCOR) 40 MG tablet TAKE 1 TABLET BY MOUTH EVERY EVENING 11/01/19 10/31/20  Redmon,  Barth Kirks, PA  simvastatin (ZOCOR) 40 MG tablet Take 1 tablet (40 mg total) by mouth daily. 10/05/20     sitaGLIPtin (JANUVIA) 100 MG tablet Take 100 mg by mouth daily.    [provider]    Family History Family History  Problem Relation Age of Onset   COPD Mother    Diabetes Mother    Hypertension Mother    Cancer Father    Diabetes Brother    Hypertension Brother    Hypertension Other    Hyperlipidemia Other    Arthritis Other    COPD Other    Obesity Other     Social History Social History   Tobacco Use   Smoking status: Every Day    Packs/day: 0.50  Years: 0.00    Pack years: 0.00    Types: Cigarettes   Smokeless tobacco: Never  Substance Use Topics   Alcohol use: No   Drug use: No     Allergies   Sulfa antibiotics   Review of Systems Review of Systems  Musculoskeletal:        L foot and ankle pain  All other systems reviewed and are negative.   Physical Exam Triage Vital Signs ED Triage Vitals [06/08/21 0810]  Enc Vitals Group     BP 128/70     Pulse Rate 91     Resp 18     Temp 97.9 F (36.6 C)     Temp Source Oral     SpO2 97 %     Weight      Height      Head Circumference      Peak Flow      Pain Score 0     Pain Loc      Pain Edu?      Excl. in [place]?    No data found.  Updated Vital Signs BP 128/70 (BP Location: Left Arm)    Pulse 91    Temp 97.9 F (36.6 C) (Oral)    Resp 18    SpO2 97%   Visual Acuity Right Eye Distance:   Left Eye Distance:   Bilateral Distance:    Right Eye Near:   Left Eye Near:    Bilateral Near:     Physical Exam Vitals reviewed.  Constitutional:      General: She is not in acute distress.    Appearance: Normal appearance. She is not ill-appearing or diaphoretic.  HENT:     Head: Normocephalic and atraumatic.  Cardiovascular:     Rate and Rhythm: Normal rate and regular rhythm.     Heart sounds: Normal heart sounds.  Pulmonary:     Effort: Pulmonary effort is normal.     Breath  sounds: Normal breath sounds.  Musculoskeletal:     Comments: L foot/ ankle- no skin changes or effusion. No point tenderness. No bony tenderness. Some tenderness medial ankle with plantarflexion. No midfoot or malleolar tenderness to palpation. Ankles 20cm bilaterally. Calves 36 bilaterally without effusion, distension, tenderness, venous distension. Negative homan sign bilaterally. DP 2+, cap refill <2 seconds. Ambulating with pain.  Skin:    General: Skin is warm.  Neurological:     General: No focal deficit present.     Mental Status: She is alert and oriented to person, place, and time.  Psychiatric:        Mood and Affect: Mood normal.        Behavior: Behavior normal.        Thought Content: Thought content normal.        Judgment: Judgment normal.      UC Treatments / Results  Labs (all labs ordered are listed, but only abnormal results are displayed) Labs Reviewed - No data to display  EKG   Radiology DG Ankle Complete Left  Result Date: 06/08/2021 CLINICAL DATA:  Left foot and ankle edema.  No injury. EXAM: LEFT ANKLE COMPLETE - 3+ VIEW; LEFT FOOT - 2 VIEW COMPARISON:  June 04, 2016 FINDINGS: There is generalized diffuse osteopenia. Narrowed joint spaces are identified in the distal interphalangeal joints. There is no acute fracture or dislocation. Minimal plantar calcaneal spur is identified. Calcification at the Achilles tendon insertion to the calcaneus is identified unchanged. IMPRESSION: No acute fracture or  dislocation. Degenerative joint changes of the foot. Electronically Signed   By: Abelardo Diesel M.D.   On: 06/08/2021 08:41   DG Foot 2 Views Left  Result Date: 06/08/2021 CLINICAL DATA:  Left foot and ankle edema.  No injury. EXAM: LEFT ANKLE COMPLETE - 3+ VIEW; LEFT FOOT - 2 VIEW COMPARISON:  June 04, 2016 FINDINGS: There is generalized diffuse osteopenia. Narrowed joint spaces are identified in the distal interphalangeal joints. There is no acute  fracture or dislocation. Minimal plantar calcaneal spur is identified. Calcification at the Achilles tendon insertion to the calcaneus is identified unchanged. IMPRESSION: No acute fracture or dislocation. Degenerative joint changes of the foot. Electronically Signed   By: Abelardo Diesel M.D.   On: 06/08/2021 08:41    Procedures Procedures (including critical care time)  Medications Ordered in UC Medications - No data to display  Initial Impression / Assessment and Plan / UC Course  I have reviewed the triage vital signs and the nursing notes.  Pertinent labs & imaging results that were available during my care of the patient were reviewed by me and considered in my medical decision making (see chart for details).  Clinical Course as of 06/08/21 0853  Fri Jun 08, 2021  8016 DG Ankle Complete Left [LG]    Clinical Course User Index [LG] Hazel Sams, PA-C    This patient is a very pleasant 60 y.o. year old female presenting with L ankle pain - nontraumatic. Arthritis. Neurovascularly intact.  Symptoms x3 weeks, patient requesting xray of both foot and ankle. Discussed that foot xray is not medically necessary given lack of bony tenderness. Patient wishes to proceed. Degenerative changes consistent with arthritis.   Wells score for DVT -2. No prior history of this.   ASO brace, voltaren gel, RICE. .   ED return precautions discussed. Patient verbalizes understanding and agreement.      Final Clinical Impressions(s) / UC Diagnoses   Final diagnoses:  Arthritis of left ankle     Discharge Instructions      -You have degenerative changes of your ankle, which indicates arthritis.  This typically gets worse in the winter, when it is cold.  It also is worse after standing and walking.   -Try the ankle brace while you are having pain, also try elevating the foot at the end of the day and using ice on the affected area.  If symptoms persist, follow-up with your primary care  provider for further interventions. -Voltaren gel up to 4 times a day for pain. -You can also use Tylenol and ibuprofen for additional relief.     ED Prescriptions     Medication Sig Dispense Auth. Provider   diclofenac Sodium (VOLTAREN) 1 % GEL Apply 4 g topically 4 (four) times daily. 100 g Hazel Sams, PA-C      PDMP not reviewed this encounter.   Hazel Sams, PA-C 06/08/21 [phone]

## 2021-06-08 NOTE — Discharge Instructions (Addendum)
-  You have degenerative changes of your ankle, which indicates arthritis.  This typically gets worse in the winter, when it is cold.  It also is worse after standing and walking.   -Try the ankle brace while you are having pain, also try elevating the foot at the end of the day and using ice on the affected area.  If symptoms persist, follow-up with your primary care provider for further interventions. -Voltaren gel up to 4 times a day for pain. -You can also use Tylenol and ibuprofen for additional relief.

## 2021-06-08 NOTE — ED Triage Notes (Signed)
Pt c/o left foot and ankle edema that was first noticed "a few weeks" ago. Denies remembering and direct trauma/injury to the area. States was at work when first noticed the edema.

## 2021-06-18 ENCOUNTER — Other Ambulatory Visit (HOSPITAL_COMMUNITY): Payer: Self-pay

## 2021-06-18 MED ORDER — METFORMIN HCL 1000 MG PO TABS
1000.0000 mg | ORAL_TABLET | Freq: Two times a day (BID) | ORAL | 1 refills | Status: DC
  Filled 2021-06-18: qty 180, 90d supply, fill #0
  Filled 2021-09-19: qty 180, 90d supply, fill #1

## 2021-06-18 MED ORDER — SIMVASTATIN 40 MG PO TABS
40.0000 mg | ORAL_TABLET | Freq: Every day | ORAL | 1 refills | Status: DC
  Filled 2021-06-18: qty 90, 90d supply, fill #0
  Filled 2021-09-19: qty 90, 90d supply, fill #1

## 2021-06-18 MED ORDER — LISINOPRIL 10 MG PO TABS
10.0000 mg | ORAL_TABLET | Freq: Every day | ORAL | 1 refills | Status: DC
  Filled 2021-06-18 – 2021-08-31 (×2): qty 90, 90d supply, fill #0
  Filled 2021-11-27: qty 90, 90d supply, fill #1

## 2021-06-18 MED FILL — Insulin Degludec Soln Pen-Injector 200 Unit/ML: SUBCUTANEOUS | 50 days supply | Qty: 9 | Fill #3 | Status: AC

## 2021-06-19 ENCOUNTER — Other Ambulatory Visit (HOSPITAL_COMMUNITY): Payer: Self-pay

## 2021-06-19 MED ORDER — RYBELSUS 14 MG PO TABS
14.0000 mg | ORAL_TABLET | Freq: Every day | ORAL | 1 refills | Status: DC
  Filled 2021-06-19: qty 90, 90d supply, fill #0
  Filled 2021-09-19: qty 90, 90d supply, fill #1

## 2021-06-19 MED ORDER — JARDIANCE 25 MG PO TABS
25.0000 mg | ORAL_TABLET | Freq: Every day | ORAL | 1 refills | Status: DC
  Filled 2021-06-19: qty 90, 90d supply, fill #0
  Filled 2021-09-19: qty 90, 90d supply, fill #1

## 2021-06-21 ENCOUNTER — Other Ambulatory Visit (HOSPITAL_COMMUNITY): Payer: Self-pay

## 2021-06-28 ENCOUNTER — Other Ambulatory Visit (HOSPITAL_COMMUNITY): Payer: Self-pay

## 2021-06-29 ENCOUNTER — Other Ambulatory Visit (HOSPITAL_COMMUNITY): Payer: Self-pay

## 2021-06-29 MED ORDER — DEXCOM G6 TRANSMITTER MISC
99 refills | Status: AC
  Filled 2021-06-29: qty 1, 90d supply, fill #0

## 2021-06-29 MED ORDER — DEXCOM G6 RECEIVER DEVI: 0 refills | Status: AC

## 2021-06-29 MED ORDER — DEXCOM G6 SENSOR MISC
99 refills | Status: AC
  Filled 2021-06-29: qty 3, 30d supply, fill #0

## 2021-08-23 ENCOUNTER — Other Ambulatory Visit (HOSPITAL_COMMUNITY): Payer: Self-pay

## 2021-08-23 DIAGNOSIS — I10 Essential (primary) hypertension: Secondary | ICD-10-CM | POA: Diagnosis not present

## 2021-08-23 DIAGNOSIS — R399 Unspecified symptoms and signs involving the genitourinary system: Secondary | ICD-10-CM | POA: Diagnosis not present

## 2021-08-23 DIAGNOSIS — E1169 Type 2 diabetes mellitus with other specified complication: Secondary | ICD-10-CM | POA: Diagnosis not present

## 2021-08-23 MED ORDER — CIPROFLOXACIN HCL 500 MG PO TABS
ORAL_TABLET | ORAL | 0 refills | Status: AC
  Filled 2021-08-23: qty 6, 3d supply, fill #0

## 2021-08-31 ENCOUNTER — Other Ambulatory Visit (HOSPITAL_COMMUNITY): Payer: Self-pay

## 2021-08-31 MED ORDER — INSULIN PEN NEEDLE 32G X 4 MM MISC
1 refills | Status: AC
  Filled 2021-08-31: qty 100, 30d supply, fill #0
  Filled 2021-11-27: qty 100, 30d supply, fill #1
  Filled 2022-03-14: qty 300, 75d supply, fill #2

## 2021-09-03 ENCOUNTER — Other Ambulatory Visit (HOSPITAL_COMMUNITY): Payer: Self-pay

## 2021-09-11 ENCOUNTER — Other Ambulatory Visit (HOSPITAL_COMMUNITY): Payer: Self-pay

## 2021-09-17 DIAGNOSIS — E1165 Type 2 diabetes mellitus with hyperglycemia: Secondary | ICD-10-CM | POA: Diagnosis not present

## 2021-09-19 ENCOUNTER — Other Ambulatory Visit (HOSPITAL_COMMUNITY): Payer: Self-pay

## 2021-09-24 ENCOUNTER — Other Ambulatory Visit (HOSPITAL_COMMUNITY): Payer: Self-pay

## 2021-09-24 DIAGNOSIS — M19071 Primary osteoarthritis, right ankle and foot: Secondary | ICD-10-CM | POA: Diagnosis not present

## 2021-09-24 DIAGNOSIS — M792 Neuralgia and neuritis, unspecified: Secondary | ICD-10-CM | POA: Diagnosis not present

## 2021-09-24 DIAGNOSIS — M7672 Peroneal tendinitis, left leg: Secondary | ICD-10-CM | POA: Diagnosis not present

## 2021-09-24 DIAGNOSIS — M19072 Primary osteoarthritis, left ankle and foot: Secondary | ICD-10-CM | POA: Diagnosis not present

## 2021-09-24 MED ORDER — MELOXICAM 7.5 MG PO TABS
ORAL_TABLET | ORAL | 0 refills | Status: DC
  Filled 2021-09-24: qty 30, 30d supply, fill #0

## 2021-10-16 ENCOUNTER — Ambulatory Visit: Payer: 59 | Admitting: Orthopaedic Surgery

## 2021-10-16 ENCOUNTER — Encounter: Payer: Self-pay | Admitting: Orthopaedic Surgery

## 2021-10-16 ENCOUNTER — Ambulatory Visit (INDEPENDENT_AMBULATORY_CARE_PROVIDER_SITE_OTHER): Payer: 59

## 2021-10-16 VITALS — Ht 64.0 in | Wt 186.0 lb

## 2021-10-16 DIAGNOSIS — G8929 Other chronic pain: Secondary | ICD-10-CM | POA: Diagnosis not present

## 2021-10-16 DIAGNOSIS — M25561 Pain in right knee: Secondary | ICD-10-CM

## 2021-10-16 MED ORDER — LIDOCAINE HCL 1 % IJ SOLN
2.0000 mL | INTRAMUSCULAR | Status: AC | PRN
  Administered 2021-10-16: 2 mL

## 2021-10-16 MED ORDER — BUPIVACAINE HCL 0.5 % IJ SOLN
2.0000 mL | INTRAMUSCULAR | Status: AC | PRN
  Administered 2021-10-16: 2 mL via INTRA_ARTICULAR

## 2021-10-16 MED ORDER — METHYLPREDNISOLONE ACETATE 40 MG/ML IJ SUSP
40.0000 mg | INTRAMUSCULAR | Status: AC | PRN
  Administered 2021-10-16: 40 mg via INTRA_ARTICULAR

## 2021-10-16 NOTE — Progress Notes (Addendum)
? ?Office Visit Note ?  ?Patient: Brandy Burns           ?Date of Birth: [birth year]           ?MRN: [id number] ?Visit Date: 10/16/2021 ?             ?Requested by: Lennie Odor, PA ?[address] ?[address] ?[place],  [place] [address] ?PCP: Lennie Odor, PA ? ? ?Assessment & Plan: ?Visit Diagnoses:  ?1. Chronic pain of right knee   ? ? ?Plan: Impression is degenerative medial meniscus tear with trace effusion.  She has already been on meloxicam which has not helped.  We proceeded with a cortisone injection today.  She will also pick up compression knee sleeve during activity.  If symptoms persist beyond 6 weeks she will let us know so that we will order an MRI. ? ?Follow-Up Instructions: No follow-ups on file.  ? ?Orders:  ?Orders Placed This Encounter  ?Procedures  ?? Large Joint Inj: R knee  ?? XR KNEE 3 VIEW RIGHT  ? ?Meds ordered this encounter  ?Medications  ?? bupivacaine (MARCAINE) 0.5 % (with pres) injection 2 mL  ?? lidocaine (XYLOCAINE) 1 % (with pres) injection 2 mL  ?? methylPREDNISolone acetate (DEPO-MEDROL) injection 40 mg  ? ? ? ? Procedures: ?Large Joint Inj: R knee on 10/16/2021 9:20 AM ?Indications: pain ?Details: 22 G needle ? ?Arthrogram: No ? ?Medications: 40 mg methylPREDNISolone acetate 40 MG/ML; 2 mL lidocaine 1 %; 2 mL bupivacaine 0.5 % ?Consent was given by the patient. Patient was prepped and draped in the usual sterile fashion.  ? ? ? ? ?Clinical Data: ?No additional findings. ? ? ?Subjective: ?Chief Complaint  ?Patient presents with  ?? Right Knee - Pain  ? ? ?HPI ? ?Brandy Burns is a very pleasant 61 year old female works in [employer] at [employer] comes in for 1 month history of right knee pain of insidious onset.  Feels a catch in the knee.  Feels some slight swelling.  Has been taking meloxicam without any relief.  Denies any previous injuries or surgeries.  Denies history of gout or constitutional symptoms. ? ?Review of Systems  ?Constitutional: Negative.   ?HENT: Negative.    ?Eyes:  Negative.   ?Respiratory: Negative.    ?Cardiovascular: Negative.   ?Endocrine: Negative.   ?Musculoskeletal: Negative.   ?Neurological: Negative.   ?Hematological: Negative.   ?Psychiatric/Behavioral: Negative.    ?All other systems reviewed and are negative. ? ? ?Objective: ?Vital Signs: Ht '5\' 4"'$  (1.626 m)   Wt 186 lb (84.4 kg)   BMI 31.93 kg/m?  ? ?Physical Exam ?Vitals and nursing note reviewed.  ?Constitutional:   ?   Appearance: She is well-developed.  ?HENT:  ?   Head: Normocephalic and atraumatic.  ?Pulmonary:  ?   Effort: Pulmonary effort is normal.  ?Abdominal:  ?   Palpations: Abdomen is soft.  ?Musculoskeletal:  ?   Cervical back: Neck supple.  ?Skin: ?   General: Skin is warm.  ?   Capillary Refill: Capillary refill takes less than 2 seconds.  ?Neurological:  ?   Mental Status: She is alert and oriented to person, place, and time.  ?Psychiatric:     ?   Behavior: Behavior normal.     ?   Thought Content: Thought content normal.     ?   Judgment: Judgment normal.  ? ? ?Ortho Exam ? ?Examination right knee shows trace effusion.  Normal range of motion.  Collaterals and cruciates  are stable.  Slight medial joint tenderness.  Negative [place]ray.  No crepitus with range of motion. ? ?Specialty Comments:  ?No specialty comments available. ? ?Imaging: ?XR KNEE 3 VIEW RIGHT ? ?Result Date: 10/16/2021 ?Mild osteoarthritis.  Preserved joint spaces.  ? ? ?PMFS History: ?Patient Active Problem List  ? Diagnosis Date Noted  ?? Distal radius fracture, left 06/21/2015  ?? Hyperlipidemia 06/16/2014  ?? Essential hypertension 06/16/2014  ?? Diabetes mellitus type 2, insulin dependent ([place]) 03/24/2014  ? ?Past Medical History:  ?Diagnosis Date  ?? Anemia   ? as a child  ?? Asthma   ?? Diabetes mellitus   ? type 2  ?? Essential hypertension 06/16/2014  ?? GERD (gastroesophageal reflux disease)   ?? Hyperlipemia   ?? Kidney stones   ?  ?Family History  ?Problem Relation Age of Onset  ?? COPD Mother   ?? Diabetes Mother   ??  Hypertension Mother   ?? Cancer Father   ?? Diabetes Brother   ?? Hypertension Brother   ?? Hypertension Other   ?? Hyperlipidemia Other   ?? Arthritis Other   ?? COPD Other   ?? Obesity Other   ?  ?Past Surgical History:  ?Procedure Laterality Date  ?? COLONOSCOPY  09/23/2011  ? Procedure: COLONOSCOPY;  Surgeon: Juanita Craver, MD;  Location: [facility];  Service: Endoscopy;  Laterality: N/A;  ?? ECTOPIC PREGNANCY SURGERY    ?? OPEN REDUCTION INTERNAL FIXATION (ORIF) DISTAL RADIAL FRACTURE Left 06/21/2015  ? Procedure: OPEN REDUCTION INTERNAL FIXATION (ORIF) DISTAL RADIAL FRACTURE AND REPAIR AS NEEDED;  Surgeon: Iran Planas, MD;  Location: [facility];  Service: Orthopedics;  Laterality: Left;  ?? TONSILLECTOMY    ?? TUBAL LIGATION    ? ?Social History  ? ?Occupational History  ?? Not on file  ?Tobacco Use  ?? Smoking status: Every Day  ?  Packs/day: 0.50  ?  Years: 0.00  ?  Pack years: 0.00  ?  Types: Cigarettes  ?? Smokeless tobacco: Never  ?Substance and Sexual Activity  ?? Alcohol use: No  ?? Drug use: No  ?? Sexual activity: Not on file  ? ? ? ? ? ? ?

## 2021-10-26 ENCOUNTER — Other Ambulatory Visit (HOSPITAL_COMMUNITY): Payer: Self-pay

## 2021-10-26 DIAGNOSIS — M792 Neuralgia and neuritis, unspecified: Secondary | ICD-10-CM | POA: Diagnosis not present

## 2021-10-26 DIAGNOSIS — M7672 Peroneal tendinitis, left leg: Secondary | ICD-10-CM | POA: Diagnosis not present

## 2021-10-26 DIAGNOSIS — M19079 Primary osteoarthritis, unspecified ankle and foot: Secondary | ICD-10-CM | POA: Diagnosis not present

## 2021-10-26 MED ORDER — MELOXICAM 7.5 MG PO TABS
ORAL_TABLET | ORAL | 1 refills | Status: AC
  Filled 2021-10-26: qty 30, 30d supply, fill #0
  Filled 2021-11-27: qty 30, 30d supply, fill #1

## 2021-11-12 ENCOUNTER — Other Ambulatory Visit (HOSPITAL_COMMUNITY): Payer: Self-pay

## 2021-11-15 ENCOUNTER — Other Ambulatory Visit (HOSPITAL_COMMUNITY): Payer: Self-pay

## 2021-11-15 DIAGNOSIS — H2513 Age-related nuclear cataract, bilateral: Secondary | ICD-10-CM | POA: Diagnosis not present

## 2021-11-15 DIAGNOSIS — H524 Presbyopia: Secondary | ICD-10-CM | POA: Diagnosis not present

## 2021-11-15 DIAGNOSIS — Z794 Long term (current) use of insulin: Secondary | ICD-10-CM | POA: Diagnosis not present

## 2021-11-15 DIAGNOSIS — H5213 Myopia, bilateral: Secondary | ICD-10-CM | POA: Diagnosis not present

## 2021-11-15 DIAGNOSIS — E1136 Type 2 diabetes mellitus with diabetic cataract: Secondary | ICD-10-CM | POA: Diagnosis not present

## 2021-11-27 ENCOUNTER — Other Ambulatory Visit (HOSPITAL_COMMUNITY): Payer: Self-pay

## 2021-12-05 ENCOUNTER — Other Ambulatory Visit (HOSPITAL_COMMUNITY): Payer: Self-pay

## 2021-12-05 DIAGNOSIS — E1169 Type 2 diabetes mellitus with other specified complication: Secondary | ICD-10-CM | POA: Diagnosis not present

## 2021-12-05 DIAGNOSIS — I10 Essential (primary) hypertension: Secondary | ICD-10-CM | POA: Diagnosis not present

## 2021-12-05 DIAGNOSIS — M79672 Pain in left foot: Secondary | ICD-10-CM | POA: Diagnosis not present

## 2021-12-05 DIAGNOSIS — E7800 Pure hypercholesterolemia, unspecified: Secondary | ICD-10-CM | POA: Diagnosis not present

## 2021-12-05 MED ORDER — DICLOFENAC SODIUM 50 MG PO TBEC
DELAYED_RELEASE_TABLET | ORAL | 0 refills | Status: AC
  Filled 2021-12-05: qty 60, 30d supply, fill #0

## 2021-12-06 ENCOUNTER — Other Ambulatory Visit (HOSPITAL_COMMUNITY): Payer: Self-pay

## 2021-12-17 ENCOUNTER — Other Ambulatory Visit (HOSPITAL_COMMUNITY): Payer: Self-pay

## 2021-12-17 MED ORDER — TRESIBA FLEXTOUCH 200 UNIT/ML ~~LOC~~ SOPN
PEN_INJECTOR | SUBCUTANEOUS | 3 refills | Status: DC
Start: 2021-12-17 — End: 2022-07-25
  Filled 2021-12-17: qty 9, 51d supply, fill #0
  Filled 2022-03-14: qty 9, 51d supply, fill #1
  Filled 2022-04-30: qty 9, 51d supply, fill #2
  Filled 2022-05-29 – 2022-06-20 (×2): qty 9, 51d supply, fill #3

## 2021-12-17 MED ORDER — RYBELSUS 14 MG PO TABS
ORAL_TABLET | ORAL | 1 refills | Status: AC
Start: 2021-12-17 — End: ?
  Filled 2021-12-17: qty 90, 90d supply, fill #0
  Filled 2022-03-14: qty 90, 90d supply, fill #1

## 2021-12-18 ENCOUNTER — Other Ambulatory Visit (HOSPITAL_COMMUNITY): Payer: Self-pay

## 2021-12-18 MED ORDER — RYBELSUS 14 MG PO TABS
ORAL_TABLET | ORAL | 1 refills | Status: DC
  Filled 2021-12-18 – 2022-06-20 (×2): qty 90, 90d supply, fill #0
  Filled 2022-09-23: qty 90, 90d supply, fill #1

## 2021-12-18 MED ORDER — SIMVASTATIN 40 MG PO TABS
40.0000 mg | ORAL_TABLET | Freq: Every day | ORAL | 1 refills | Status: DC
  Filled 2021-12-18: qty 90, 90d supply, fill #0
  Filled 2022-04-30: qty 90, 90d supply, fill #1

## 2021-12-18 MED ORDER — METFORMIN HCL 1000 MG PO TABS
1000.0000 mg | ORAL_TABLET | Freq: Two times a day (BID) | ORAL | 1 refills | Status: DC
Start: 2021-12-18 — End: 2022-12-02
  Filled 2021-12-18: qty 180, 90d supply, fill #0
  Filled 2022-04-30: qty 180, 90d supply, fill #1

## 2021-12-25 ENCOUNTER — Other Ambulatory Visit (HOSPITAL_COMMUNITY): Payer: Self-pay

## 2021-12-25 DIAGNOSIS — E119 Type 2 diabetes mellitus without complications: Secondary | ICD-10-CM | POA: Diagnosis not present

## 2021-12-25 DIAGNOSIS — E782 Mixed hyperlipidemia: Secondary | ICD-10-CM | POA: Diagnosis not present

## 2021-12-25 DIAGNOSIS — E669 Obesity, unspecified: Secondary | ICD-10-CM | POA: Diagnosis not present

## 2021-12-25 DIAGNOSIS — Z1211 Encounter for screening for malignant neoplasm of colon: Secondary | ICD-10-CM | POA: Diagnosis not present

## 2021-12-25 DIAGNOSIS — K219 Gastro-esophageal reflux disease without esophagitis: Secondary | ICD-10-CM | POA: Diagnosis not present

## 2021-12-25 MED ORDER — CLENPIQ 10-3.5-12 MG-GM -GM/175ML PO SOLN
ORAL | 0 refills | Status: AC
  Filled 2021-12-25: qty 350, 1d supply, fill #0

## 2021-12-26 ENCOUNTER — Other Ambulatory Visit (HOSPITAL_COMMUNITY): Payer: Self-pay

## 2021-12-27 ENCOUNTER — Other Ambulatory Visit (HOSPITAL_COMMUNITY): Payer: Self-pay

## 2021-12-28 ENCOUNTER — Other Ambulatory Visit (HOSPITAL_COMMUNITY): Payer: Self-pay

## 2021-12-28 MED ORDER — JARDIANCE 25 MG PO TABS
25.0000 mg | ORAL_TABLET | Freq: Every day | ORAL | 1 refills | Status: DC
  Filled 2021-12-28: qty 90, 90d supply, fill #0
  Filled 2022-04-30: qty 90, 90d supply, fill #1

## 2022-01-07 DIAGNOSIS — Z1211 Encounter for screening for malignant neoplasm of colon: Secondary | ICD-10-CM | POA: Diagnosis not present

## 2022-01-09 ENCOUNTER — Encounter: Payer: Self-pay | Admitting: Orthopaedic Surgery

## 2022-01-09 ENCOUNTER — Ambulatory Visit: Payer: 59 | Admitting: Orthopaedic Surgery

## 2022-01-09 DIAGNOSIS — G8929 Other chronic pain: Secondary | ICD-10-CM | POA: Diagnosis not present

## 2022-01-09 DIAGNOSIS — M25561 Pain in right knee: Secondary | ICD-10-CM

## 2022-01-09 MED ORDER — LIDOCAINE HCL 1 % IJ SOLN
2.0000 mL | INTRAMUSCULAR | Status: AC | PRN
  Administered 2022-01-09: 2 mL

## 2022-01-09 MED ORDER — METHYLPREDNISOLONE ACETATE 40 MG/ML IJ SUSP
40.0000 mg | INTRAMUSCULAR | Status: AC | PRN
  Administered 2022-01-09: 40 mg via INTRA_ARTICULAR

## 2022-01-09 MED ORDER — BUPIVACAINE HCL 0.5 % IJ SOLN
2.0000 mL | INTRAMUSCULAR | Status: AC | PRN
  Administered 2022-01-09: 2 mL via INTRA_ARTICULAR

## 2022-01-09 NOTE — Progress Notes (Signed)
Office Visit Note   Patient: Brandy Burns           Date of Birth: 04-10-1961           MRN: [id number] Visit Date: 01/09/2022              Requested by: Lennie Odor, PA [address] [place],  [place] [address] PCP: Lennie Odor, PA   Assessment & Plan: Visit Diagnoses:  1. Chronic pain of right knee     Plan: Donalda comes back today for recurrent right knee pain.  We saw her about 3 months ago and did a cortisone injection which helped tremendously.  She started having lateral sided knee pain yesterday.  Denies any injuries.  Examination of right knee shows no joint effusion.  Lateral sided tenderness to palpation.  Slight lateral joint line tenderness.  Impression is recurrent right knee pain.  Previous cortisone injection will help for about 3 months.  She would like to repeat a cortisone injection today.  If the pain returns or the relief is short-lived we will need to obtain MRI to rule out structural abnormalities.  Follow-Up Instructions: No follow-ups on file.   Orders:  No orders of the defined types were placed in this encounter.  No orders of the defined types were placed in this encounter.     Procedures: Large Joint Inj: R knee on 01/09/2022 10:12 AM Indications: pain Details: 22 G needle  Arthrogram: No  Medications: 40 mg methylPREDNISolone acetate 40 MG/ML; 2 mL lidocaine 1 %; 2 mL bupivacaine 0.5 % Consent was given by the patient. Patient was prepped and draped in the usual sterile fashion.       Clinical Data: No additional findings.   Subjective: Chief Complaint  Patient presents with   Right Knee - Pain    HPI  Review of Systems   Objective: Vital Signs: There were no vitals taken for this visit.  Physical Exam  Ortho Exam  Specialty Comments:  No specialty comments available.  Imaging: No results found.   PMFS History: Patient Active Problem List   Diagnosis Date Noted   Chronic pain of right  knee 01/09/2022   Distal radius fracture, left 06/21/2015   Hyperlipidemia 06/16/2014   Essential hypertension 06/16/2014   Diabetes mellitus type 2, insulin dependent ([place]) 03/24/2014   Past Medical History:  Diagnosis Date   Anemia    as a child   Asthma    Diabetes mellitus    type 2   Essential hypertension 06/16/2014   GERD (gastroesophageal reflux disease)    Hyperlipemia    Kidney stones     Family History  Problem Relation Age of Onset   COPD Mother    Diabetes Mother    Hypertension Mother    Cancer Father    Diabetes Brother    Hypertension Brother    Hypertension Other    Hyperlipidemia Other    Arthritis Other    COPD Other    Obesity Other     Past Surgical History:  Procedure Laterality Date   COLONOSCOPY  09/23/2011   Procedure: COLONOSCOPY;  Surgeon: Juanita Craver, MD;  Location[facility];  Service: Endoscopy;  Laterality: N/A;   ECTOPIC PREGNANCY SURGERY     OPEN REDUCTION INTERNAL FIXATION (ORIF) DISTAL RADIAL FRACTURE Left 06/21/2015   Procedure: OPEN REDUCTION INTERNAL FIXATION (ORIF) DISTAL RADIAL FRACTURE AND REPAIR AS NEEDED;  Surgeon: Iran Planas, MD;  Location: [facility];  Service: Orthopedics;  Laterality: Left;   TONSILLECTOMY     TUBAL LIGATION     Social History   Occupational History   Not on file  Tobacco Use   Smoking status: Every Day    Packs/day: 0.50    Years: 0.00    Total pack years: 0.00    Types: Cigarettes   Smokeless tobacco: Never  Substance and Sexual Activity   Alcohol use: No   Drug use: No   Sexual activity: Not on file

## 2022-01-14 ENCOUNTER — Other Ambulatory Visit: Payer: Self-pay

## 2022-01-14 ENCOUNTER — Telehealth: Payer: Self-pay | Admitting: Orthopaedic Surgery

## 2022-01-14 DIAGNOSIS — G8929 Other chronic pain: Secondary | ICD-10-CM

## 2022-01-14 NOTE — Telephone Encounter (Signed)
Patient called advised the injection did not work and wanted to know if she can be set up for an MRI? The number to contact patient is [phone] Per patient please call before 3:00pm.

## 2022-01-14 NOTE — Telephone Encounter (Signed)
Yes, right knee

## 2022-01-22 ENCOUNTER — Ambulatory Visit
Admission: RE | Admit: 2022-01-22 | Discharge: 2022-01-22 | Disposition: A | Discharge status: Home / Self Care | Payer: 59 | Source: Ambulatory Visit | Attending: Orthopaedic Surgery | Admitting: Orthopaedic Surgery

## 2022-01-22 DIAGNOSIS — G8929 Other chronic pain: Secondary | ICD-10-CM

## 2022-01-22 DIAGNOSIS — R600 Localized edema: Secondary | ICD-10-CM | POA: Diagnosis not present

## 2022-01-25 DIAGNOSIS — Z124 Encounter for screening for malignant neoplasm of cervix: Secondary | ICD-10-CM | POA: Diagnosis not present

## 2022-01-25 DIAGNOSIS — Z01419 Encounter for gynecological examination (general) (routine) without abnormal findings: Secondary | ICD-10-CM | POA: Diagnosis not present

## 2022-01-27 NOTE — Progress Notes (Signed)
Needs appt for MRI please.  Thanks.

## 2022-01-28 NOTE — Progress Notes (Signed)
Tried to call. No answer. LMOM for patient to call and schedule MRI review.

## 2022-02-05 ENCOUNTER — Ambulatory Visit: Payer: 59 | Admitting: Orthopaedic Surgery

## 2022-02-05 DIAGNOSIS — G8929 Other chronic pain: Secondary | ICD-10-CM

## 2022-02-05 DIAGNOSIS — M1711 Unilateral primary osteoarthritis, right knee: Secondary | ICD-10-CM

## 2022-02-05 DIAGNOSIS — M25561 Pain in right knee: Secondary | ICD-10-CM | POA: Diagnosis not present

## 2022-02-05 NOTE — Progress Notes (Signed)
Office Visit Note   Patient: Brandy Burns           Date of Birth: [birth year]           MRN: [id number] Visit Date: 02/05/2022              Requested by: Brandy Odor, PA [address] [place],   [id number] PCP: Brandy Odor, PA   Assessment & Plan: Visit Diagnoses:  1. Primary osteoarthritis of right knee     Plan: Brandy Burns returns today for MRI review of the right knee.  Currently walks with a cane.  Fell Saturday due to the pain.  Examination of the right knee is mild valgus instability.  MRI is consistent with a stress reaction of the lateral femoral condyle.  I do not see any meniscal pathology.  Mild chondromalacia.  Overall I think that she is overdoing it at work and spends too much time on her feet due to staff shortages.  I recommended that she do sedentary work for 3 months to allow this to heal.  We will provide her with a lateral unloader brace.  We will check calcium and vitamin D levels.  We will follow-up with her in about 6 weeks.  We will let her know the results of the calcium and vitamin D.  Follow-Up Instructions: Return in about 6 weeks (around 03/19/2022).   Orders:  Orders Placed This Encounter  Procedures   Vitamin D (25 hydroxy)   Calcium   Meds ordered this encounter  Medications   Vitamin D, Ergocalciferol, (DRISDOL) 1.25 MG (50000 UNIT) CAPS capsule    Sig: Take 1 capsule (50,000 Units total) by mouth every 7 (seven) days.    Dispense:  5 capsule    Refill:  3      Procedures: No procedures performed   Clinical Data: No additional findings.   Subjective: Chief Complaint  Patient presents with   Right Knee - Pain    HPI  Review of Systems   Objective: Vital Signs: There were no vitals taken for this visit.  Physical Exam  Ortho Exam  Specialty Comments:  No specialty comments available.  Imaging: No results found.   PMFS History: Patient Active Problem List   Diagnosis Date Noted    Chronic pain of right knee 01/09/2022   Distal radius fracture, left 06/21/2015   Hyperlipidemia 06/16/2014   Essential hypertension 06/16/2014   Diabetes mellitus type 2, insulin dependent ([place]) 03/24/2014   Past Medical History:  Diagnosis Date   Anemia    as a child   Asthma    Diabetes mellitus    type 2   Essential hypertension 06/16/2014   GERD (gastroesophageal reflux disease)    Hyperlipemia    Kidney stones     Family History  Problem Relation Age of Onset   COPD Mother    Diabetes Mother    Hypertension Mother    Cancer Father    Diabetes Brother    Hypertension Brother    Hypertension Other    Hyperlipidemia Other    Arthritis Other    COPD Other    Obesity Other     Past Surgical History:  Procedure Laterality Date   COLONOSCOPY  09/23/2011   Procedure: COLONOSCOPY;  Surgeon: Juanita Craver, MD;  Location: [facility];  Service: Endoscopy;  Laterality: N/A;   ECTOPIC PREGNANCY SURGERY     OPEN REDUCTION INTERNAL FIXATION (ORIF) DISTAL RADIAL FRACTURE Left 06/21/2015   Procedure: OPEN  REDUCTION INTERNAL FIXATION (ORIF) DISTAL RADIAL FRACTURE AND REPAIR AS NEEDED;  Surgeon: Iran Planas, MD;  Location: [facility];  Service: Orthopedics;  Laterality: Left;   TONSILLECTOMY     TUBAL LIGATION     Social History   Occupational History   Not on file  Tobacco Use   Smoking status: Every Day    Packs/day: 0.50    Years: 0.00    Total pack years: 0.00    Types: Cigarettes   Smokeless tobacco: Never  Substance and Sexual Activity   Alcohol use: No   Drug use: No   Sexual activity: Not on file

## 2022-02-06 ENCOUNTER — Other Ambulatory Visit (HOSPITAL_COMMUNITY): Payer: Self-pay

## 2022-02-06 LAB — CALCIUM: Calcium: 9.3 mg/dL (ref 8.6–10.4)

## 2022-02-06 LAB — VITAMIN D 25 HYDROXY (VIT D DEFICIENCY, FRACTURES): Vit D, 25-Hydroxy: 19 ng/mL — ABNORMAL LOW (ref 30–100)

## 2022-02-06 MED ORDER — VITAMIN D (ERGOCALCIFEROL) 1.25 MG (50000 UNIT) PO CAPS
50000.0000 [IU] | ORAL_CAPSULE | ORAL | 3 refills | Status: AC
  Filled 2022-02-06: qty 4, 28d supply, fill #0
  Filled 2022-04-30: qty 4, 28d supply, fill #1
  Filled 2022-06-20: qty 4, 28d supply, fill #2
  Filled 2022-07-25: qty 4, 28d supply, fill #3
  Filled 2022-08-19: qty 4, 28d supply, fill #4

## 2022-02-06 NOTE — Addendum Note (Signed)
Addended by: Azucena Cecil on: 02/06/2022 08:07 AM   Modules accepted: Orders

## 2022-02-22 DIAGNOSIS — M1711 Unilateral primary osteoarthritis, right knee: Secondary | ICD-10-CM | POA: Diagnosis not present

## 2022-03-14 ENCOUNTER — Other Ambulatory Visit (HOSPITAL_COMMUNITY): Payer: Self-pay

## 2022-03-14 MED ORDER — INSULIN LISPRO (1 UNIT DIAL) 100 UNIT/ML (KWIKPEN)
16.0000 [IU] | PEN_INJECTOR | Freq: Two times a day (BID) | SUBCUTANEOUS | 3 refills | Status: DC
  Filled 2022-03-14: qty 18, 56d supply, fill #0
  Filled 2022-05-06: qty 18, 56d supply, fill #1
  Filled 2022-05-29 – 2022-07-25 (×2): qty 18, 56d supply, fill #2
  Filled 2022-09-23: qty 18, 56d supply, fill #3

## 2022-03-14 MED ORDER — LISINOPRIL 10 MG PO TABS
10.0000 mg | ORAL_TABLET | Freq: Every day | ORAL | 1 refills | Status: DC
  Filled 2022-03-14: qty 90, 90d supply, fill #0
  Filled 2022-06-20: qty 90, 90d supply, fill #1

## 2022-03-15 ENCOUNTER — Other Ambulatory Visit (HOSPITAL_COMMUNITY): Payer: Self-pay

## 2022-03-21 ENCOUNTER — Ambulatory Visit: Payer: 59 | Admitting: Orthopaedic Surgery

## 2022-03-21 ENCOUNTER — Encounter: Payer: Self-pay | Admitting: Orthopaedic Surgery

## 2022-03-21 DIAGNOSIS — M1711 Unilateral primary osteoarthritis, right knee: Secondary | ICD-10-CM | POA: Diagnosis not present

## 2022-03-21 NOTE — Progress Notes (Signed)
Office Visit Note   Patient: Brandy Burns           Date of Birth: 1961-06-01           MRN: [id number] Visit Date: 03/21/2022              Requested by: Lennie Odor, PA [address] [place],  [place] [id number] PCP: Lennie Odor, PA   Assessment & Plan: Visit Diagnoses:  1. Primary osteoarthritis of right knee     Plan: Overall Brandy Burns is improving which I am happy about.  She will contact [name] with [employer] to get the brace adjusted.  She will think about [facility] as well.  For now she will continue to take her vitamin D and do light duty.  We will see her back as needed.  Follow-Up Instructions: No follow-ups on file.   Orders:  No orders of the defined types were placed in this encounter.  No orders of the defined types were placed in this encounter.     Procedures: No procedures performed   Clinical Data: No additional findings.   Subjective: Chief Complaint  Patient presents with   Right Knee - Pain    HPI Brandy Burns returns today for follow-up on her right knee pain.  Overall does feel better.  The lateral unloader brace slides down which is her main complaint.  She has been taking vitamin D replacement.  She has been at work doing Lobbyist. Review of Systems   Objective: Vital Signs: There were no vitals taken for this visit.  Physical Exam  Ortho Exam Right knee exam is unchanged. Specialty Comments:  No specialty comments available.  Imaging: No results found.   PMFS History: Patient Active Problem List   Diagnosis Date Noted   Chronic pain of right knee 01/09/2022   Distal radius fracture, left 06/21/2015   Hyperlipidemia 06/16/2014   Essential hypertension 06/16/2014   Diabetes mellitus type 2, insulin dependent ([place]) 03/24/2014   Past Medical History:  Diagnosis Date   Anemia    as a child   Asthma    Diabetes mellitus    type 2   Essential hypertension 06/16/2014   GERD (gastroesophageal reflux disease)     Hyperlipemia    Kidney stones     Family History  Problem Relation Age of Onset   COPD Mother    Diabetes Mother    Hypertension Mother    Cancer Father    Diabetes Brother    Hypertension Brother    Hypertension Other    Hyperlipidemia Other    Arthritis Other    COPD Other    Obesity Other     Past Surgical History:  Procedure Laterality Date   COLONOSCOPY  09/23/2011   Procedure: COLONOSCOPY;  Surgeon: Juanita Craver, MD;  Location: [facility];  Service: Endoscopy;  Laterality: N/A;   ECTOPIC PREGNANCY SURGERY     OPEN REDUCTION INTERNAL FIXATION (ORIF) DISTAL RADIAL FRACTURE Left 06/21/2015   Procedure: OPEN REDUCTION INTERNAL FIXATION (ORIF) DISTAL RADIAL FRACTURE AND REPAIR AS NEEDED;  Surgeon: Iran Planas, MD;  Location: [facility];  Service: Orthopedics;  Laterality: Left;   TONSILLECTOMY     TUBAL LIGATION     Social History   Occupational History   Not on file  Tobacco Use   Smoking status: Every Day    Packs/day: 0.50    Years: 0.00    Total pack years: 0.00    Types: Cigarettes   Smokeless tobacco:  Never  Substance and Sexual Activity   Alcohol use: No   Drug use: No   Sexual activity: Not on file

## 2022-03-27 ENCOUNTER — Other Ambulatory Visit (HOSPITAL_COMMUNITY): Payer: Self-pay

## 2022-04-29 IMAGING — DX DG FOOT 2V*L*
2 series · 2 of 2 positions shown · non-contrast
Comparison: June 04, 2016

CLINICAL DATA: Left foot and ankle edema.  No injury.

EXAM:
LEFT ANKLE COMPLETE - 3+ VIEW; LEFT FOOT - 2 VIEW

[foot supine dp]
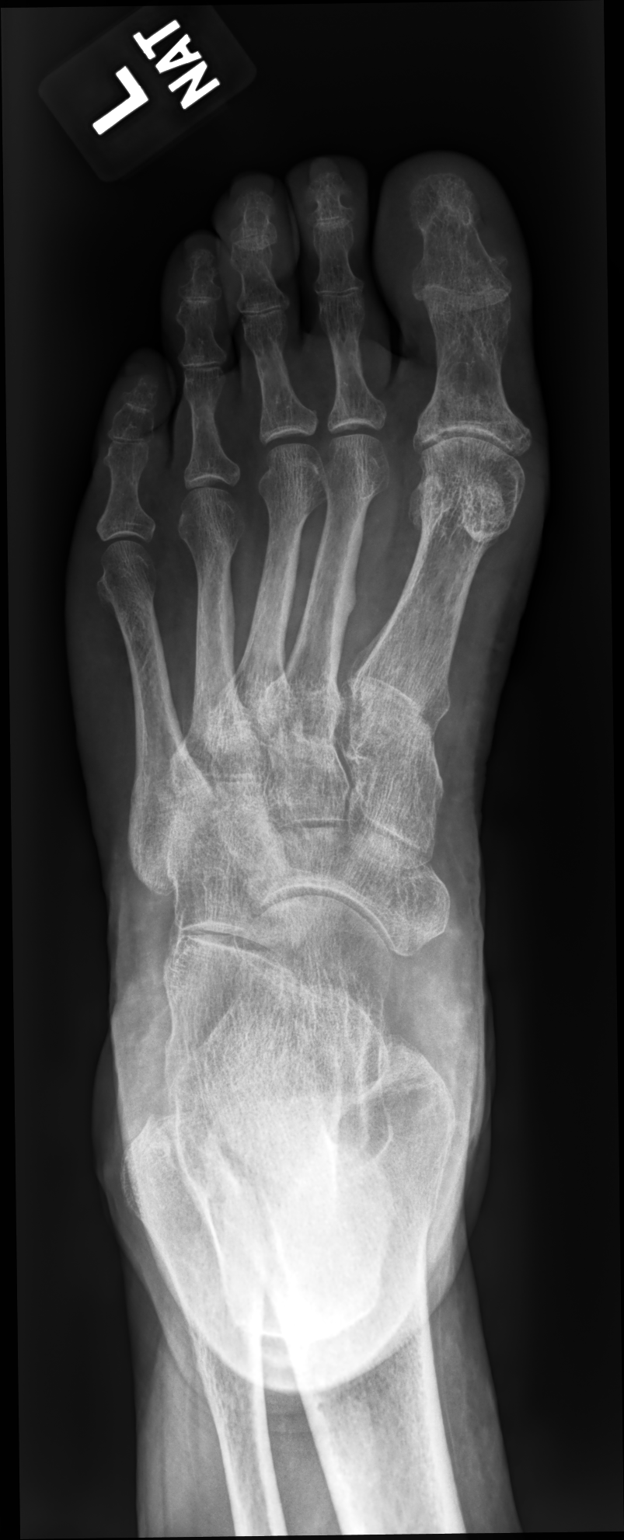

[foot supine lat]
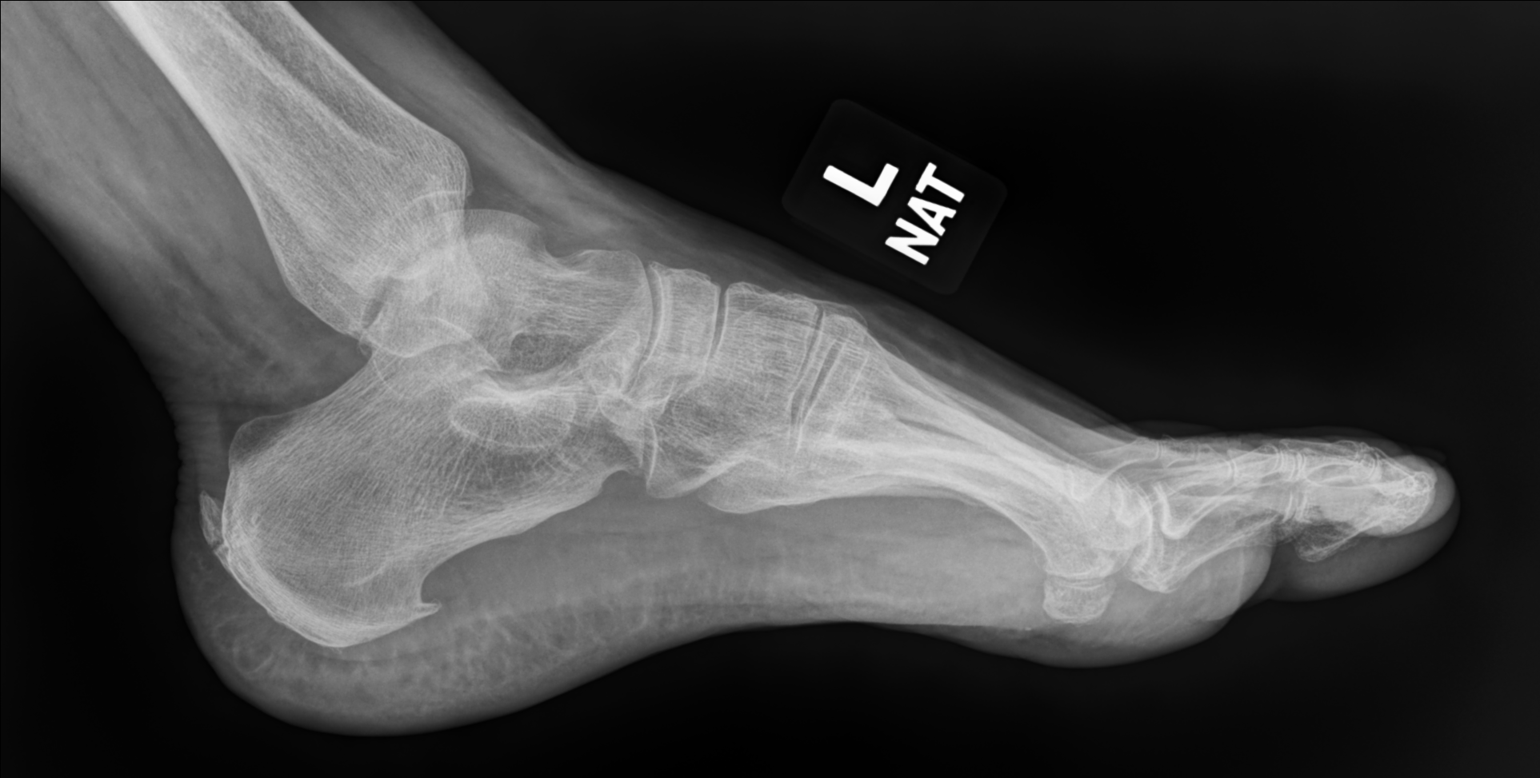

[2 of 2 positions shown; findings below may reference images not displayed]

FINDINGS: There is generalized diffuse osteopenia. Narrowed joint spaces are
identified in the distal interphalangeal joints. There is no acute
fracture or dislocation. Minimal plantar calcaneal spur is
identified. Calcification at the Achilles tendon insertion to the
calcaneus is identified unchanged.
IMPRESSION: No acute fracture or dislocation. Degenerative joint changes of the
foot.

## 2022-04-30 ENCOUNTER — Other Ambulatory Visit (HOSPITAL_COMMUNITY): Payer: Self-pay

## 2022-05-06 ENCOUNTER — Other Ambulatory Visit (HOSPITAL_COMMUNITY): Payer: Self-pay

## 2022-05-29 ENCOUNTER — Other Ambulatory Visit (HOSPITAL_COMMUNITY): Payer: Self-pay

## 2022-06-04 ENCOUNTER — Other Ambulatory Visit (HOSPITAL_COMMUNITY): Payer: Self-pay

## 2022-06-05 ENCOUNTER — Other Ambulatory Visit (HOSPITAL_COMMUNITY): Payer: Self-pay

## 2022-06-05 DIAGNOSIS — E7800 Pure hypercholesterolemia, unspecified: Secondary | ICD-10-CM | POA: Diagnosis not present

## 2022-06-05 DIAGNOSIS — Z0000 Encounter for general adult medical examination without abnormal findings: Secondary | ICD-10-CM | POA: Diagnosis not present

## 2022-06-05 DIAGNOSIS — E1169 Type 2 diabetes mellitus with other specified complication: Secondary | ICD-10-CM | POA: Diagnosis not present

## 2022-06-05 DIAGNOSIS — K219 Gastro-esophageal reflux disease without esophagitis: Secondary | ICD-10-CM | POA: Diagnosis not present

## 2022-06-05 DIAGNOSIS — I10 Essential (primary) hypertension: Secondary | ICD-10-CM | POA: Diagnosis not present

## 2022-06-05 MED ORDER — FREESTYLE LITE TEST VI STRP
ORAL_STRIP | 3 refills | Status: DC
  Filled 2022-06-05: qty 200, 90d supply, fill #0
  Filled 2022-06-20: qty 100, 50d supply, fill #0
  Filled 2022-12-02: qty 100, 50d supply, fill #1
  Filled 2023-05-05: qty 100, 50d supply, fill #2

## 2022-06-17 ENCOUNTER — Other Ambulatory Visit (HOSPITAL_COMMUNITY): Payer: Self-pay

## 2022-06-20 ENCOUNTER — Other Ambulatory Visit (HOSPITAL_COMMUNITY): Payer: Self-pay

## 2022-06-21 ENCOUNTER — Other Ambulatory Visit (HOSPITAL_COMMUNITY): Payer: Self-pay

## 2022-06-21 MED ORDER — DEXCOM G7 RECEIVER DEVI
0 refills | Status: AC
  Filled 2022-06-21 (×2): qty 1, 90d supply, fill #0

## 2022-06-21 MED ORDER — DEXCOM G7 SENSOR MISC
11 refills | Status: AC
  Filled 2022-06-21: qty 9, 90d supply, fill #0

## 2022-06-24 ENCOUNTER — Other Ambulatory Visit (HOSPITAL_COMMUNITY): Payer: Self-pay

## 2022-07-02 ENCOUNTER — Other Ambulatory Visit (HOSPITAL_COMMUNITY): Payer: Self-pay

## 2022-07-03 ENCOUNTER — Other Ambulatory Visit (HOSPITAL_COMMUNITY): Payer: Self-pay

## 2022-07-24 ENCOUNTER — Ambulatory Visit (INDEPENDENT_AMBULATORY_CARE_PROVIDER_SITE_OTHER): Payer: Commercial Managed Care - PPO | Admitting: Orthopaedic Surgery

## 2022-07-24 DIAGNOSIS — M1711 Unilateral primary osteoarthritis, right knee: Secondary | ICD-10-CM

## 2022-07-24 NOTE — Progress Notes (Signed)
No charge.  Handicap placard renewed.

## 2022-07-25 ENCOUNTER — Other Ambulatory Visit (HOSPITAL_COMMUNITY): Payer: Self-pay

## 2022-07-25 MED ORDER — TRESIBA FLEXTOUCH 200 UNIT/ML ~~LOC~~ SOPN
35.0000 [IU] | PEN_INJECTOR | Freq: Every day | SUBCUTANEOUS | 3 refills | Status: DC
  Filled 2022-07-25 – 2022-08-19 (×3): qty 9, 51d supply, fill #0
  Filled 2022-10-22: qty 9, 51d supply, fill #1
  Filled 2022-12-18: qty 9, 51d supply, fill #2
  Filled 2023-02-19 (×2): qty 9, 51d supply, fill #3

## 2022-07-30 ENCOUNTER — Other Ambulatory Visit: Payer: Self-pay

## 2022-07-30 ENCOUNTER — Other Ambulatory Visit (HOSPITAL_COMMUNITY): Payer: Self-pay

## 2022-08-09 ENCOUNTER — Other Ambulatory Visit (HOSPITAL_COMMUNITY): Payer: Self-pay

## 2022-08-12 ENCOUNTER — Other Ambulatory Visit (HOSPITAL_COMMUNITY): Payer: Self-pay

## 2022-08-19 ENCOUNTER — Other Ambulatory Visit (HOSPITAL_COMMUNITY): Payer: Self-pay

## 2022-08-27 ENCOUNTER — Ambulatory Visit: Payer: Commercial Managed Care - PPO | Admitting: Orthopaedic Surgery

## 2022-08-27 ENCOUNTER — Other Ambulatory Visit (INDEPENDENT_AMBULATORY_CARE_PROVIDER_SITE_OTHER): Payer: Commercial Managed Care - PPO

## 2022-08-27 DIAGNOSIS — M1711 Unilateral primary osteoarthritis, right knee: Secondary | ICD-10-CM

## 2022-08-27 DIAGNOSIS — M25521 Pain in right elbow: Secondary | ICD-10-CM

## 2022-08-27 MED ORDER — METHYLPREDNISOLONE ACETATE 40 MG/ML IJ SUSP
40.0000 mg | INTRAMUSCULAR | Status: AC | PRN
  Administered 2022-08-27: 40 mg via INTRA_ARTICULAR

## 2022-08-27 MED ORDER — LIDOCAINE HCL 1 % IJ SOLN
2.0000 mL | INTRAMUSCULAR | Status: AC | PRN
  Administered 2022-08-27: 2 mL

## 2022-08-27 MED ORDER — BUPIVACAINE HCL 0.5 % IJ SOLN
2.0000 mL | INTRAMUSCULAR | Status: AC | PRN
  Administered 2022-08-27: 2 mL via INTRA_ARTICULAR

## 2022-08-27 NOTE — Progress Notes (Addendum)
Office Visit Note   Patient: Brandy Burns           Date of Birth: 05/22/1961           MRN: [id number] Visit Date: 08/27/2022              Requested by: Lennie Odor, PA [address] [place],  [place] [id number] PCP: Lennie Odor, PA   Assessment & Plan: Visit Diagnoses:  1. Pain in right elbow   2. Primary osteoarthritis of right knee     Plan: 62 year old female with right elbow pain and right knee pain.  Impression right lateral epicondylitis and right knee osteoarthritis.  Disease process reviewed and provided her with home exercises for the tennis elbow.  She has a compression elbow brace with counterforce strap.  Activity modification.  Voltaren gel.  Will hold off on injection for now.  For the right knee OA we repeated the steroid injection today.  She tolerates well.  Follow-up as needed.  Follow-Up Instructions: No follow-ups on file.   Orders:  Orders Placed This Encounter  Procedures   XR Elbow Complete Right (3+View)   No orders of the defined types were placed in this encounter.     Procedures: Large Joint Inj: R knee on 08/27/2022 9:52 AM Indications: pain Details: 22 G needle  Arthrogram: No  Medications: 40 mg methylPREDNISolone acetate 40 MG/ML; 2 mL lidocaine 1 %; 2 mL bupivacaine 0.5 % Consent was given by the patient. Patient was prepped and draped in the usual sterile fashion.       Clinical Data: No additional findings.   Subjective: Chief Complaint  Patient presents with   Right Knee - Pain   Right Elbow - Pain    HPI  Brandy Burns returns today for lateral right elbow pain for about a month.  Denies any definite injuries or trauma.  Right knee started hurting for about a month as well.  Denies any injuries.  Review of Systems   Objective: Vital Signs: There were no vitals taken for this visit.  Physical Exam  Ortho Exam  Examination right elbow shows point tenderness to the lateral epicondyle.  Pain with  resisted ECRB extension.  Examination right knee is unchanged.  No joint effusion.  Specialty Comments:  No specialty comments available.  Imaging: No results found.   PMFS History: Patient Active Problem List   Diagnosis Date Noted   Chronic pain of right knee 01/09/2022   Distal radius fracture, left 06/21/2015   Hyperlipidemia 06/16/2014   Essential hypertension 06/16/2014   Diabetes mellitus type 2, insulin dependent ([place]) 03/24/2014   Past Medical History:  Diagnosis Date   Anemia    as a child   Asthma    Diabetes mellitus    type 2   Essential hypertension 06/16/2014   GERD (gastroesophageal reflux disease)    Hyperlipemia    Kidney stones     Family History  Problem Relation Age of Onset   COPD Mother    Diabetes Mother    Hypertension Mother    Cancer Father    Diabetes Brother    Hypertension Brother    Hypertension Other    Hyperlipidemia Other    Arthritis Other    COPD Other    Obesity Other     Past Surgical History:  Procedure Laterality Date   COLONOSCOPY  09/23/2011   Procedure: COLONOSCOPY;  Surgeon: Juanita Craver, MD;  Location: [facility];  Service: Endoscopy;  Laterality:  N/A;   ECTOPIC PREGNANCY SURGERY     OPEN REDUCTION INTERNAL FIXATION (ORIF) DISTAL RADIAL FRACTURE Left 06/21/2015   Procedure: OPEN REDUCTION INTERNAL FIXATION (ORIF) DISTAL RADIAL FRACTURE AND REPAIR AS NEEDED;  Surgeon: Iran Planas, MD;  Location: [facility];  Service: Orthopedics;  Laterality: Left;   TONSILLECTOMY     TUBAL LIGATION     Social History   Occupational History   Not on file  Tobacco Use   Smoking status: Every Day    Packs/day: 0.50    Years: 0.00    Additional pack years: 0.00    Total pack years: 0.00    Types: Cigarettes   Smokeless tobacco: Never  Substance and Sexual Activity   Alcohol use: No   Drug use: No   Sexual activity: Not on file

## 2022-09-21 ENCOUNTER — Encounter (HOSPITAL_COMMUNITY): Payer: Self-pay

## 2022-09-21 ENCOUNTER — Emergency Department (HOSPITAL_COMMUNITY)
Admission: EM | Admit: 2022-09-21 | Discharge: 2022-09-22 | Discharge status: Against Medical Advice | Payer: Commercial Managed Care - PPO | Attending: Emergency Medicine | Admitting: Emergency Medicine

## 2022-09-21 DIAGNOSIS — Z5321 Procedure and treatment not carried out due to patient leaving prior to being seen by health care provider: Secondary | ICD-10-CM | POA: Insufficient documentation

## 2022-09-21 DIAGNOSIS — L02415 Cutaneous abscess of right lower limb: Secondary | ICD-10-CM | POA: Diagnosis not present

## 2022-09-21 NOTE — ED Triage Notes (Signed)
Pt arrived POV for concern of an abscess posterior to right thigh, area is red and swollen. Appears to have pus-filled pockets to area. Pt unsure if it started as a pimple or spider bite. Painful to touch. NAD noted VSS.

## 2022-09-22 ENCOUNTER — Ambulatory Visit (HOSPITAL_COMMUNITY)
Admission: EM | Admit: 2022-09-22 | Discharge: 2022-09-22 | Disposition: A | Discharge status: Home / Self Care | Payer: Commercial Managed Care - PPO | Attending: Emergency Medicine | Admitting: Emergency Medicine

## 2022-09-22 ENCOUNTER — Encounter (HOSPITAL_COMMUNITY): Payer: Self-pay | Admitting: Emergency Medicine

## 2022-09-22 DIAGNOSIS — R21 Rash and other nonspecific skin eruption: Secondary | ICD-10-CM

## 2022-09-22 MED ORDER — DOXYCYCLINE HYCLATE 100 MG PO CAPS: 100.0000 mg | ORAL_CAPSULE | Freq: Two times a day (BID) | ORAL | 0 refills | Status: AC

## 2022-09-22 NOTE — ED Triage Notes (Signed)
Pt c/o abscess like sore area on posterior upper right leg for a few days. Has redness and swelling. Went to [facility] last night but never seen.

## 2022-09-22 NOTE — ED Provider Notes (Signed)
[facility]    CSN: [id number] Arrival date & time: 09/22/22  1017      History   Chief Complaint Chief Complaint  Patient presents with   Abscess    HPI Brandy Burns is a 62 y.o. female.   Patient presents to clinic over a skin concern to her right buttock/upper thigh area.  Thinks the area started out as a bump, initially she was in bed and felt a sharp stinging sensation like a bite to the area so she scratched it with her nail and thinks she broke the skin.  After this it started swelling, got some blisters, and had some redness.  She was concerned over infection so she went to the emergency room last night, however, she did not stay to get evaluated.  Presents to clinic with ongoing rash/lesion for the past 4 days.  Denies any fever, shortness of breath or chest pain.  Denies any other areas of concern.  Also endorses sinus congestion for the past few weeks.  Does not take a daily antihistamine.     The history is provided by the patient.  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      Home Medications    Prior to Admission medications   Medication Sig Start Date End Date Taking? Authorizing Provider  doxycycline (VIBRAMYCIN) 100 MG capsule Take 1 capsule (100 mg total) by mouth 2 (two) times daily for 7 days. 09/22/22 09/29/22 Yes Rinaldo Ratel, [place] N, FNP  aspirin 81 MG tablet Take 81 mg by mouth daily.    [provider]  cetirizine (ZYRTEC) 10 MG tablet Take 1 tablet (10 mg total) by mouth daily. 05/18/19   Dahlia Byes A, NP  ciprofloxacin (CIPRO) 500 MG tablet Take 1 tablet by mouth every 12 hours for 3 days 08/23/21     Continuous Blood Gluc Receiver (DEXCOM G6 RECEIVER) DEVI Use as directed 05/30/21     Continuous Blood Gluc Receiver (DEXCOM G7 RECEIVER) DEVI Use as directed 06/20/22     Continuous Blood Gluc Sensor (DEXCOM G6 SENSOR) MISC Use as directed 05/30/21     Continuous Blood Gluc Sensor (DEXCOM G7 SENSOR) MISC Apply to skin every 10 days as directed 06/20/22     Continuous Blood Gluc Transmit (DEXCOM G6 TRANSMITTER)  MISC Use as directed 05/30/21     diclofenac (VOLTAREN) 50 MG EC tablet Take 1 tablet by mouth twice a day with food as needed. 12/05/21     diclofenac Sodium (VOLTAREN) 1 % GEL Apply 4 g topically 4 (four) times daily. 06/08/21   Rhys Martini, PA-C  Dulaglutide (TRULICITY) 0.75 MG/0.5ML SOPN inject as directed once weekly 10/02/20     empagliflozin (JARDIANCE) 25 MG TABS tablet Take 1 tablet by mouth daily. 12/28/21     fluticasone (FLONASE) 50 MCG/ACT nasal spray Place 1 spray into both nostrils daily. 05/18/19   Dahlia Byes A, NP  glucose blood (FREESTYLE LITE) test strip Use to test blood sugar 2 times a day as directed 06/05/22     HYDROcodone-acetaminophen (NORCO) 5-325 MG tablet Take 1 tablet by mouth every 6 (six) hours as  needed for moderate pain. 06/04/16   Elvina Sidle, MD  HYDROcodone-homatropine (HYDROMET) 5-1.5 MG/5ML syrup Take 5 mLs by mouth every 6 (six) hours as needed for cough. 06/07/18   Elvina Sidle, MD  ibuprofen (ADVIL,MOTRIN) 800 MG tablet Take 1 tablet (800 mg total) by mouth 3 (three) times daily. 12/13/16   Deatra Canter, FNP  insulin aspart (NOVOLOG) 100 UNIT/ML FlexPen Inject 10 Units into the skin every evening.    [provider]  insulin degludec (TRESIBA FLEXTOUCH) 200 UNIT/ML FlexTouch Pen INJECT 35 UNITS UNDER THE SKIN ONCE DAILY 10/02/20     insulin degludec (TRESIBA FLEXTOUCH) 200 UNIT/ML FlexTouch Pen Inject 35 units into the skin daily. 07/25/22     insulin glargine (LANTUS) 100 UNIT/ML injection Inject 34 Units into the skin daily.     [provider]  insulin lispro (HUMALOG KWIKPEN) 100 UNIT/ML KwikPen Inject 16 Units into the skin 2 (two) times daily before breakfast and supper. 03/14/22     insulin lispro (HUMALOG) 100 UNIT/ML KwikPen INJECT 10 UNITS UNDER THE SKIN WITH BREAKFAST AND DINNER 11/01/19 10/31/20  Redmon, Altamease Oiler, PA  insulin lispro (HUMALOG) 100 UNIT/ML KwikPen Inject 10 units twice a day before breakfast and dinner 10/02/20     insulin lispro (HUMALOG) 100 UNIT/ML KwikPen Inject 10 units subcutaneously twice a day, before breakfast and dinner 10/05/20     Insulin Pen Needle 32G X 4 MM MISC For use with Lantus and Humalog injections 08/31/21     lisinopril (PRINIVIL,ZESTRIL) 10 MG tablet Take 10 mg by mouth daily.    [provider]  lisinopril (ZESTRIL) 10 MG tablet TAKE 1 TABLET BY MOUTH ONCE DAILY FOR KIDNEY PROTECTION 11/01/19 10/31/20  Redmon, Altamease Oiler, PA  lisinopril (ZESTRIL) 10 MG tablet Take 1 tablet by mouth once a day for kidney protection 05/17/21     lisinopril (ZESTRIL) 10 MG tablet Take 1 tablet (10 mg total) by mouth daily. 03/14/22     meloxicam (MOBIC) 7.5 MG tablet Take 1 tablet by mouth once daily 10/26/21     metFORMIN  (GLUCOPHAGE) 1000 MG tablet Take 1,000 mg by mouth 2 (two) times daily with a meal.     [provider]  metFORMIN (GLUCOPHAGE) 1000 MG tablet TAKE 1 TABLET BY MOUTH TWICE DAILY WITH A MEAL 07/31/20 07/31/21  Redmon, Noelle, PA  metFORMIN (GLUCOPHAGE) 1000 MG tablet TAKE 1 TABLET BY MOUTH TWICE DAILY WITH A MEAL 11/01/19 10/31/20  Redmon, Altamease Oiler, PA  metFORMIN (GLUCOPHAGE) 1000 MG tablet Take 1 tablet (1,000 mg total) by mouth 2 (two) times daily. 12/18/21     Semaglutide (RYBELSUS) 14 MG TABS Take 1 tablet by mouth  once daily at least 30 minutes before first food, beverage or other oral medicine of the day 12/17/21     Semaglutide (RYBELSUS) 14 MG TABS Take 1 tablet (14 mg) by mouth daily 30 minutes before first food, beverage or other oral medicine of the day 12/18/21     Semaglutide (RYBELSUS) 3 MG TABS Take one tablet by mouth at least 30 minutes before first food, beverage or other oral medicine of the day. 12/14/20     Semaglutide (RYBELSUS) 7 MG TABS Take 1 tablet by mouth at least 30 minutes before first food, beverage or other oral medicine of the day. 03/27/21     simvastatin (ZOCOR) 40 MG tablet Take 40 mg by mouth every evening.    [provider]  simvastatin (ZOCOR) 40 MG tablet TAKE 1 TABLET BY MOUTH EVERY EVENING 07/31/20 07/31/21  Redmon, Altamease Oiler, PA  simvastatin (ZOCOR) 40 MG tablet TAKE 1 TABLET BY MOUTH EVERY EVENING 11/01/19 10/31/20  Redmon, Altamease Oiler, PA  simvastatin (ZOCOR) 40 MG tablet Take 1 tablet (40 mg total) by mouth daily. 12/18/21     sitaGLIPtin (JANUVIA) 100 MG tablet Take 100 mg by mouth daily.    [provider]  Sod Picosulfate-Mag Ox-Cit Acd (CLENPIQ) 10-3.5-12 MG-GM -GM/175ML SOLN TAKE AS DIRECTED PER OFFICE INSTRUCTIONS AND NOT INSTRUCTIONS ON PACKAGING.  DO NOT REFRIGERATE. 12/25/21     Vitamin D, Ergocalciferol, (DRISDOL) 1.25 MG (50000 UNIT) CAPS capsule Take 1 capsule (50,000 Units total) by mouth every 7 (seven) days. 02/06/22   Tarry Kos, MD     Family History Family History  Problem Relation Age of Onset   COPD Mother    Diabetes Mother    Hypertension Mother    Cancer Father    Diabetes Brother    Hypertension Brother    Hypertension Other    Hyperlipidemia Other    Arthritis Other    COPD Other    Obesity Other     Social History Social History   Tobacco Use   Smoking status: Every Day    Packs/day: 0.50    Years: 0.00    Additional pack years: 0.00    Total pack years: 0.00    Types: Cigarettes   Smokeless tobacco: Never  Substance Use Topics   Alcohol use: No   Drug use: No     Allergies   Chloramphenicol, Sulfa antibiotics, and Dulaglutide   Review of Systems Review of Systems  Constitutional:  Negative for fatigue and fever.  HENT:  Negative for sore throat.   Respiratory:  Negative for cough and shortness of breath.   Cardiovascular:  Negative for chest pain.  Gastrointestinal:  Negative for abdominal distention.  Genitourinary:  Negative for dysuria.  Musculoskeletal:  Negative for back pain.  Skin:  Positive for rash and wound.  Neurological:  Negative for syncope and headaches.     Physical Exam Triage Vital Signs ED Triage Vitals  Enc Vitals Group     BP 09/22/22 1121 113/73     Pulse Rate 09/22/22 1121 81     Resp 09/22/22 1121 19     Temp 09/22/22 1121 (!) 97.5 F (36.4 C)     Temp Source 09/22/22 1121 Oral     SpO2 09/22/22 1121 97 %     Weight --      Height --      Head Circumference --      Peak Flow --      Pain Score 09/22/22 1120 4  Pain Loc --      Pain Edu? --      Excl. in GC? --    No data found.  Updated Vital Signs BP 113/73 (BP Location: Right Arm)   Pulse 81   Temp (!) 97.5 F (36.4 C) (Oral)   Resp 19   SpO2 97%   Visual Acuity Right Eye Distance:   Left Eye Distance:   Bilateral Distance:    Right Eye Near:   Left Eye Near:    Bilateral Near:     Physical Exam Vitals and nursing note reviewed.  Constitutional:      Appearance:  Normal appearance.  HENT:     Head: Normocephalic and atraumatic.     Right Ear: External ear normal.     Left Ear: External ear normal.     Nose: Congestion present.     Mouth/Throat:     Mouth: Mucous membranes are moist.  Eyes:     General: No scleral icterus.       Right eye: No discharge.        Left eye: No discharge.     Conjunctiva/sclera: Conjunctivae normal.  Cardiovascular:     Rate and Rhythm: Normal rate and regular rhythm.  Pulmonary:     Effort: Pulmonary effort is normal. No respiratory distress.  Musculoskeletal:        General: No swelling. Normal range of motion.  Skin:    General: Skin is warm and dry.     Capillary Refill: Capillary refill takes less than 2 seconds.     Findings: Lesion, rash and wound present.          Comments: Area of circular erythema surrounding scabbed vesicular lesions to right upper posterior thigh.  Areas without fluctuance or induration.  Appears superficial. Without streaking.   Neurological:     Mental Status: She is alert and oriented to person, place, and time.  Psychiatric:        Behavior: Behavior normal. Behavior is cooperative.      UC Treatments / Results  Labs (all labs ordered are listed, but only abnormal results are displayed) Labs Reviewed - No data to display  EKG   Radiology No results found.  Procedures Procedures (including critical care time)  Medications Ordered in UC Medications - No data to display  Initial Impression / Assessment and Plan / UC Course  I have reviewed the triage vital signs and the nursing notes.  Pertinent labs & imaging results that were available during my care of the patient were reviewed by me and considered in my medical decision making (see chart for details).  Vitals in triage reviewed, patient is hemodynamically stable.  Presents to clinic with a circular erythematous rash with multiple crusted vesicular lesions in the center.  Patient does have a history of type 2  diabetes.  Without fever, streaking, warmth or erythema.  Will cover for bacterial etiology due to presentation and history with doxycycline.  Wound care discussed, return and follow-up precautions reviewed, patient verbalized understanding, no questions at this time.     Final Clinical Impressions(s) / UC Diagnoses   Final diagnoses:  Rash and nonspecific skin eruption     Discharge Instructions      You appear to have a skin infection of your right upper thigh.  Please take all antibiotics as prescribed until finished.  You can take these with food to help prevent gastrointestinal upset.  Please wear sun protection if you go outside, as this  antibiotic makes you more prone to sunburn.  Please do not pick or scratch at the area, and keep it clean and dry.  You can continue to apply the antibacterial ointment Neosporin to it.  The antibiotic I am prescribing should have coverage against sinusitis if your sinus congestion is bacterial.  Please return to clinic or follow-up with your primary care if you have no improvement over the next 3 days, you develop fever, shortness of breath, spreading of your lesion, or any new concerning symptoms.      ED Prescriptions     Medication Sig Dispense Auth. Provider   doxycycline (VIBRAMYCIN) 100 MG capsule Take 1 capsule (100 mg total) by mouth 2 (two) times daily for 7 days. 14 capsule Betul Brisky, [place] N, [place]      PDMP not reviewed this encounter.   Latham Kinzler, [place] N, [place] 09/22/22 1140

## 2022-09-22 NOTE — Discharge Instructions (Addendum)
You appear to have a skin infection of your right upper thigh.  Please take all antibiotics as prescribed until finished.  You can take these with food to help prevent gastrointestinal upset.  Please wear sun protection if you go outside, as this antibiotic makes you more prone to sunburn.  Please do not pick or scratch at the area, and keep it clean and dry.  You can continue to apply the antibacterial ointment Neosporin to it.  The antibiotic I am prescribing should have coverage against sinusitis if your sinus congestion is bacterial.  Please return to clinic or follow-up with your primary care if you have no improvement over the next 3 days, you develop fever, shortness of breath, spreading of your lesion, or any new concerning symptoms.

## 2022-09-23 ENCOUNTER — Other Ambulatory Visit: Payer: Self-pay

## 2022-09-23 ENCOUNTER — Other Ambulatory Visit (HOSPITAL_COMMUNITY): Payer: Self-pay

## 2022-09-23 MED ORDER — LISINOPRIL 10 MG PO TABS
10.0000 mg | ORAL_TABLET | Freq: Every day | ORAL | 1 refills | Status: DC
  Filled 2022-09-23: qty 90, 90d supply, fill #0
  Filled 2022-12-18: qty 90, 90d supply, fill #1

## 2022-09-26 DIAGNOSIS — B029 Zoster without complications: Secondary | ICD-10-CM | POA: Diagnosis not present

## 2022-09-26 DIAGNOSIS — I10 Essential (primary) hypertension: Secondary | ICD-10-CM | POA: Diagnosis not present

## 2022-09-26 DIAGNOSIS — E1169 Type 2 diabetes mellitus with other specified complication: Secondary | ICD-10-CM | POA: Diagnosis not present

## 2022-10-07 ENCOUNTER — Other Ambulatory Visit: Payer: Self-pay | Admitting: Orthopaedic Surgery

## 2022-10-07 ENCOUNTER — Other Ambulatory Visit (HOSPITAL_COMMUNITY): Payer: Self-pay

## 2022-10-07 MED ORDER — SIMVASTATIN 40 MG PO TABS
40.0000 mg | ORAL_TABLET | Freq: Every day | ORAL | 1 refills | Status: DC
  Filled 2022-10-07: qty 90, 90d supply, fill #0

## 2022-10-08 ENCOUNTER — Other Ambulatory Visit (HOSPITAL_COMMUNITY): Payer: Self-pay

## 2022-10-11 ENCOUNTER — Other Ambulatory Visit (HOSPITAL_COMMUNITY): Payer: Self-pay

## 2022-10-11 MED ORDER — JARDIANCE 25 MG PO TABS
25.0000 mg | ORAL_TABLET | Freq: Every day | ORAL | 1 refills | Status: DC
  Filled 2022-10-11 – 2022-10-22 (×2): qty 90, 90d supply, fill #0
  Filled 2023-02-19: qty 90, 90d supply, fill #1

## 2022-10-16 ENCOUNTER — Other Ambulatory Visit (HOSPITAL_COMMUNITY): Payer: Self-pay

## 2022-10-18 ENCOUNTER — Other Ambulatory Visit (HOSPITAL_COMMUNITY): Payer: Self-pay

## 2022-10-22 ENCOUNTER — Other Ambulatory Visit (HOSPITAL_COMMUNITY): Payer: Self-pay

## 2022-10-29 ENCOUNTER — Telehealth: Payer: Self-pay | Admitting: Orthopaedic Surgery

## 2022-10-29 NOTE — Telephone Encounter (Signed)
Patient asking to up date her handicap placard please advise

## 2022-10-29 NOTE — Telephone Encounter (Signed)
sure

## 2022-11-07 ENCOUNTER — Other Ambulatory Visit (HOSPITAL_COMMUNITY): Payer: Self-pay

## 2022-11-18 DIAGNOSIS — H2513 Age-related nuclear cataract, bilateral: Secondary | ICD-10-CM | POA: Diagnosis not present

## 2022-11-18 DIAGNOSIS — H52203 Unspecified astigmatism, bilateral: Secondary | ICD-10-CM | POA: Diagnosis not present

## 2022-11-18 DIAGNOSIS — Z7984 Long term (current) use of oral hypoglycemic drugs: Secondary | ICD-10-CM | POA: Diagnosis not present

## 2022-11-18 DIAGNOSIS — H5213 Myopia, bilateral: Secondary | ICD-10-CM | POA: Diagnosis not present

## 2022-11-18 DIAGNOSIS — Z794 Long term (current) use of insulin: Secondary | ICD-10-CM | POA: Diagnosis not present

## 2022-11-18 DIAGNOSIS — E1136 Type 2 diabetes mellitus with diabetic cataract: Secondary | ICD-10-CM | POA: Diagnosis not present

## 2022-11-18 DIAGNOSIS — H524 Presbyopia: Secondary | ICD-10-CM | POA: Diagnosis not present

## 2022-12-02 ENCOUNTER — Other Ambulatory Visit (HOSPITAL_COMMUNITY): Payer: Self-pay

## 2022-12-02 MED ORDER — INSULIN LISPRO (1 UNIT DIAL) 100 UNIT/ML (KWIKPEN)
16.0000 [IU] | PEN_INJECTOR | Freq: Two times a day (BID) | SUBCUTANEOUS | 3 refills | Status: AC
  Filled 2022-12-02: qty 9, 28d supply, fill #0
  Filled 2023-01-06: qty 9, 28d supply, fill #1
  Filled 2023-01-06: qty 27, 84d supply, fill #1
  Filled 2023-03-31: qty 27, 84d supply, fill #2

## 2022-12-02 MED ORDER — METFORMIN HCL 1000 MG PO TABS
1000.0000 mg | ORAL_TABLET | Freq: Two times a day (BID) | ORAL | 1 refills | Status: DC
  Filled 2022-12-02: qty 180, 90d supply, fill #0
  Filled 2023-05-05: qty 180, 90d supply, fill #1

## 2022-12-05 DIAGNOSIS — E7800 Pure hypercholesterolemia, unspecified: Secondary | ICD-10-CM | POA: Diagnosis not present

## 2022-12-05 DIAGNOSIS — K219 Gastro-esophageal reflux disease without esophagitis: Secondary | ICD-10-CM | POA: Diagnosis not present

## 2022-12-05 DIAGNOSIS — I10 Essential (primary) hypertension: Secondary | ICD-10-CM | POA: Diagnosis not present

## 2022-12-05 DIAGNOSIS — E1169 Type 2 diabetes mellitus with other specified complication: Secondary | ICD-10-CM | POA: Diagnosis not present

## 2022-12-05 DIAGNOSIS — F17200 Nicotine dependence, unspecified, uncomplicated: Secondary | ICD-10-CM | POA: Diagnosis not present

## 2022-12-09 ENCOUNTER — Other Ambulatory Visit (HOSPITAL_COMMUNITY): Payer: Self-pay

## 2022-12-09 MED ORDER — ATORVASTATIN CALCIUM 20 MG PO TABS
20.0000 mg | ORAL_TABLET | Freq: Every day | ORAL | 3 refills | Status: AC
  Filled 2022-12-09: qty 30, 30d supply, fill #0
  Filled 2023-01-06: qty 90, 90d supply, fill #1
  Filled 2023-01-06: qty 30, 30d supply, fill #1

## 2022-12-18 ENCOUNTER — Other Ambulatory Visit (HOSPITAL_COMMUNITY): Payer: Self-pay

## 2022-12-18 ENCOUNTER — Other Ambulatory Visit: Payer: Self-pay

## 2023-01-06 ENCOUNTER — Other Ambulatory Visit (HOSPITAL_COMMUNITY): Payer: Self-pay

## 2023-01-14 ENCOUNTER — Other Ambulatory Visit (HOSPITAL_COMMUNITY): Payer: Self-pay

## 2023-01-14 ENCOUNTER — Other Ambulatory Visit: Payer: Self-pay | Admitting: Physician Assistant

## 2023-01-14 ENCOUNTER — Ambulatory Visit: Admission: RE | Admit: 2023-01-14 | Payer: Commercial Managed Care - PPO | Source: Ambulatory Visit

## 2023-01-14 DIAGNOSIS — Z1231 Encounter for screening mammogram for malignant neoplasm of breast: Secondary | ICD-10-CM | POA: Diagnosis not present

## 2023-01-14 MED ORDER — RYBELSUS 14 MG PO TABS
ORAL_TABLET | ORAL | 1 refills | Status: AC
  Filled 2023-01-14: qty 90, 90d supply, fill #0
  Filled 2023-01-22: qty 30, 30d supply, fill #0
  Filled 2023-02-19: qty 30, 30d supply, fill #1
  Filled 2023-03-31: qty 30, 30d supply, fill #2
  Filled 2023-05-05: qty 30, 30d supply, fill #3
  Filled 2023-06-02: qty 30, 30d supply, fill #4

## 2023-01-22 ENCOUNTER — Other Ambulatory Visit (HOSPITAL_COMMUNITY): Payer: Self-pay

## 2023-01-31 ENCOUNTER — Encounter: Payer: Self-pay | Admitting: Physician Assistant

## 2023-01-31 ENCOUNTER — Other Ambulatory Visit (INDEPENDENT_AMBULATORY_CARE_PROVIDER_SITE_OTHER): Payer: Commercial Managed Care - PPO

## 2023-01-31 ENCOUNTER — Other Ambulatory Visit: Payer: Self-pay | Admitting: Physician Assistant

## 2023-01-31 ENCOUNTER — Ambulatory Visit: Payer: Commercial Managed Care - PPO | Admitting: Physician Assistant

## 2023-01-31 DIAGNOSIS — G8929 Other chronic pain: Secondary | ICD-10-CM

## 2023-01-31 DIAGNOSIS — M25561 Pain in right knee: Secondary | ICD-10-CM | POA: Diagnosis not present

## 2023-01-31 NOTE — Progress Notes (Signed)
Office Visit Note   Patient: Brandy Burns           Date of Birth: 09-27-1960           MRN: [id number] Visit Date: 01/31/2023              Requested by: Milus Height, PA [address] [place],  [place] [address] PCP: Milus Height, PA   Assessment & Plan: Visit Diagnoses:  1. Chronic pain of right knee     Plan: Patient is a longtime patient of Dr. Roda Shutters.  She is 2 weeks status post tripping over a basketball and landing onto the front of her right knee.  She had quite a bit of pain then but is continued to get better.  She had a lot of bruising wanted to make sure she did not have any occult fracture.  She is working.  Describes her pain is mild to moderate  Follow-Up Instructions: As needed  Orders:  Orders Placed This Encounter  Procedures   XR Knee 1-2 Views Right   No orders of the defined types were placed in this encounter.     Procedures: No procedures performed   Clinical Data: No additional findings.   Subjective: Chief Complaint  Patient presents with   Right Knee - Pain    HPI pleasant 62 year old woman who presents with a 2-week history of right knee pain after falling over a basketball.  Has been getting better but wants to make sure that she has not injured her knee significantly.  Review of Systems  All other systems reviewed and are negative.    Objective: Vital Signs: There were no vitals taken for this visit.  Physical Exam Constitutional:      Appearance: Normal appearance.  Pulmonary:     Effort: Pulmonary effort is normal.  Musculoskeletal:     Cervical back: Normal range of motion.  Skin:    General: Skin is warm and dry.  Neurological:     General: No focal deficit present.     Mental Status: She is alert and oriented to person, place, and time.     Ortho Exam Right knee no effusion no erythema compartments are soft and compressible she does have some ecchymosis on the medial side of her ankle and some  generalized soft tissue swelling but no cellulitis.  She has a healed abrasion over the tibial tubercle.  Negative Homans' sign Specialty Comments:  No specialty comments available.  Imaging: No results found.   PMFS History: Patient Active Problem List   Diagnosis Date Noted   Chronic pain of right knee 01/09/2022   Distal radius fracture, left 06/21/2015   Hyperlipidemia 06/16/2014   Essential hypertension 06/16/2014   Diabetes mellitus type 2, insulin dependent (HCC) 03/24/2014   Past Medical History:  Diagnosis Date   Anemia    as a child   Asthma    Diabetes mellitus    type 2   Essential hypertension 06/16/2014   GERD (gastroesophageal reflux disease)    Hyperlipemia    Kidney stones     Family History  Problem Relation Age of Onset   COPD Mother    Diabetes Mother    Hypertension Mother    Cancer Father    Diabetes Brother    Hypertension Brother    Hypertension Other    Hyperlipidemia Other    Arthritis Other    COPD Other    Obesity Other    Breast cancer Neg  Hx     Past Surgical History:  Procedure Laterality Date   COLONOSCOPY  09/23/2011   Procedure: COLONOSCOPY;  Surgeon: Charna Elizabeth, MD;  Location: [facility];  Service: Endoscopy;  Laterality: N/A;   ECTOPIC PREGNANCY SURGERY     OPEN REDUCTION INTERNAL FIXATION (ORIF) DISTAL RADIAL FRACTURE Left 06/21/2015   Procedure: OPEN REDUCTION INTERNAL FIXATION (ORIF) DISTAL RADIAL FRACTURE AND REPAIR AS NEEDED;  Surgeon: Bradly Bienenstock, MD;  Location: [facility];  Service: Orthopedics;  Laterality: Left;   TONSILLECTOMY     TUBAL LIGATION     Social History   Occupational History   Not on file  Tobacco Use   Smoking status: Every Day    Current packs/day: 0.50    Types: Cigarettes   Smokeless tobacco: Never  Substance and Sexual Activity   Alcohol use: No   Drug use: No   Sexual activity: Not on file

## 2023-01-31 NOTE — Progress Notes (Unsigned)
Office Visit Note   Patient: Brandy Burns           Date of Birth: 1961-05-12           MRN: [id number] Visit Date: 01/31/2023              Requested by: No referring provider defined for this encounter. PCP: Milus Height, PA  No chief complaint on file.     HPI: Patient is a pleasant 62 year old woman of Dr.Xu.  She is 2 weeks status post tripping over a basketball onto her knee.  She does have a history of mild arthritis in her knee.  She has had difficulty but it is getting much better.  She wants to make sure that she did not do anything harmful.  Rates the pain is mild to moderate right now was much worse a couple weeks ago  Assessment & Plan: Visit Diagnoses:  1. Chronic pain of right knee     Plan: Right knee contusion she does have some ecchymosis going down into the ankle but is able to file over the muscles of her lower leg and has good stability.  Since she is getting a lot better I would simply observe this.  Encouraged ankle pumps icing elevating and Voltaren gel will follow-up if she gets worse or not better  Follow-Up Instructions: As needed  Ortho Exam  Patient is alert, oriented, no adenopathy, well-dressed, normal affect, normal respiratory effort. Examination of her right knee she does have some soft tissue swelling but compartments are compressible negative Denna Haggard' sign she is neurovascularly intact she has good extension and flexion of her knee extension and flexion of her ankle she has some ecchymosis on the medial side of her ankle.  No real tenderness to palpation today.  Imaging: No results found. No images are attached to the encounter.  Labs: Lab Results  Component Value Date   LABURIC 5.0 12/20/2013   LABURIC 5.2 05/08/2007   REPTSTATUS 05/21/2019 FINAL 05/18/2019   CULT  05/18/2019    NO GROUP A STREP (S.PYOGENES) ISOLATED Performed at [facility], [address] [place], [place] [address]      No results found for:  "ALBUMIN", "PREALBUMIN", "CBC"  No results found for: "MG" Lab Results  Component Value Date   VD25OH 19 (L) 02/05/2022    No results found for: "PREALBUMIN"    Latest Ref Rng & Units 06/21/2015    2:37 PM 03/04/2012    9:50 AM 11/17/2008   12:03 PM  CBC EXTENDED  WBC 4.0 - 10.5 K/uL 12.4     RBC 3.87 - 5.11 MIL/uL 4.34     Hemoglobin 12.0 - 15.0 g/dL 21.3  08.6  57.8   HCT 36.0 - 46.0 % 38.7  38.0  40.0   Platelets 150 - 400 K/uL 245        There is no height or weight on file to calculate BMI.  Orders:  No orders of the defined types were placed in this encounter.  No orders of the defined types were placed in this encounter.    Procedures: No procedures performed  Clinical Data: No additional findings.  ROS:  All other systems negative, except as noted in the HPI. Review of Systems  Objective: Vital Signs: There were no vitals taken for this visit.  Specialty Comments:  No specialty comments available.  PMFS History: Patient Active Problem List   Diagnosis Date Noted   Chronic pain of right knee 01/09/2022  Distal radius fracture, left 06/21/2015   Hyperlipidemia 06/16/2014   Essential hypertension 06/16/2014   Diabetes mellitus type 2, insulin dependent (HCC) 03/24/2014   Past Medical History:  Diagnosis Date   Anemia    as a child   Asthma    Diabetes mellitus    type 2   Essential hypertension 06/16/2014   GERD (gastroesophageal reflux disease)    Hyperlipemia    Kidney stones     Family History  Problem Relation Age of Onset   COPD Mother    Diabetes Mother    Hypertension Mother    Cancer Father    Diabetes Brother    Hypertension Brother    Hypertension Other    Hyperlipidemia Other    Arthritis Other    COPD Other    Obesity Other    Breast cancer Neg Hx     Past Surgical History:  Procedure Laterality Date   COLONOSCOPY  09/23/2011   Procedure: COLONOSCOPY;  Surgeon: Charna Elizabeth, MD;  Location: [facility];  Service:  Endoscopy;  Laterality: N/A;   ECTOPIC PREGNANCY SURGERY     OPEN REDUCTION INTERNAL FIXATION (ORIF) DISTAL RADIAL FRACTURE Left 06/21/2015   Procedure: OPEN REDUCTION INTERNAL FIXATION (ORIF) DISTAL RADIAL FRACTURE AND REPAIR AS NEEDED;  Surgeon: Bradly Bienenstock, MD;  Location: [facility];  Service: Orthopedics;  Laterality: Left;   TONSILLECTOMY     TUBAL LIGATION     Social History   Occupational History   Not on file  Tobacco Use   Smoking status: Every Day    Current packs/day: 0.50    Types: Cigarettes   Smokeless tobacco: Never  Substance and Sexual Activity   Alcohol use: No   Drug use: No   Sexual activity: Not on file

## 2023-02-19 ENCOUNTER — Other Ambulatory Visit (HOSPITAL_COMMUNITY): Payer: Self-pay

## 2023-02-20 ENCOUNTER — Other Ambulatory Visit (HOSPITAL_COMMUNITY): Payer: Self-pay

## 2023-02-20 MED ORDER — TECHLITE PEN NEEDLES 32G X 4 MM MISC
1 refills | Status: AC
  Filled 2023-02-20: qty 300, 30d supply, fill #0
  Filled 2023-05-05: qty 300, 30d supply, fill #1

## 2023-02-21 ENCOUNTER — Other Ambulatory Visit (HOSPITAL_COMMUNITY): Payer: Self-pay

## 2023-03-11 DIAGNOSIS — Z01419 Encounter for gynecological examination (general) (routine) without abnormal findings: Secondary | ICD-10-CM | POA: Diagnosis not present

## 2023-03-13 DIAGNOSIS — E7800 Pure hypercholesterolemia, unspecified: Secondary | ICD-10-CM | POA: Diagnosis not present

## 2023-03-14 ENCOUNTER — Other Ambulatory Visit (HOSPITAL_COMMUNITY): Payer: Self-pay

## 2023-03-14 MED ORDER — ATORVASTATIN CALCIUM 40 MG PO TABS
40.0000 mg | ORAL_TABLET | Freq: Every day | ORAL | 3 refills | Status: DC
  Filled 2023-03-14: qty 30, 30d supply, fill #0
  Filled 2023-04-16 – 2023-05-05 (×2): qty 30, 30d supply, fill #1
  Filled 2023-06-02: qty 30, 30d supply, fill #2
  Filled 2023-07-07: qty 30, 30d supply, fill #3

## 2023-03-15 ENCOUNTER — Other Ambulatory Visit (HOSPITAL_COMMUNITY): Payer: Self-pay

## 2023-03-18 ENCOUNTER — Other Ambulatory Visit (HOSPITAL_COMMUNITY): Payer: Self-pay

## 2023-03-31 ENCOUNTER — Other Ambulatory Visit (HOSPITAL_COMMUNITY): Payer: Self-pay

## 2023-04-16 ENCOUNTER — Other Ambulatory Visit (HOSPITAL_COMMUNITY): Payer: Self-pay

## 2023-04-16 MED ORDER — TRESIBA FLEXTOUCH 200 UNIT/ML ~~LOC~~ SOPN
PEN_INJECTOR | SUBCUTANEOUS | 3 refills | Status: AC
Start: 2023-04-16 — End: ?
  Filled 2023-04-16 – 2023-05-05 (×2): qty 9, 51d supply, fill #0

## 2023-04-16 MED ORDER — LISINOPRIL 10 MG PO TABS
10.0000 mg | ORAL_TABLET | Freq: Every day | ORAL | 0 refills | Status: DC
  Filled 2023-04-16 – 2023-05-05 (×2): qty 90, 90d supply, fill #0

## 2023-04-22 ENCOUNTER — Telehealth: Payer: Self-pay | Admitting: Orthopaedic Surgery

## 2023-04-22 NOTE — Telephone Encounter (Signed)
Pt would like new handicap placard please advise

## 2023-04-22 NOTE — Telephone Encounter (Signed)
5 years.

## 2023-04-23 NOTE — Telephone Encounter (Signed)
Called patient. Left message that handicap placard is up front for pick up.

## 2023-04-29 ENCOUNTER — Other Ambulatory Visit (HOSPITAL_COMMUNITY): Payer: Self-pay

## 2023-05-05 ENCOUNTER — Other Ambulatory Visit (HOSPITAL_COMMUNITY): Payer: Self-pay

## 2023-06-02 ENCOUNTER — Other Ambulatory Visit (HOSPITAL_COMMUNITY): Payer: Self-pay

## 2023-06-03 ENCOUNTER — Other Ambulatory Visit (HOSPITAL_COMMUNITY): Payer: Self-pay

## 2023-06-09 DIAGNOSIS — E1169 Type 2 diabetes mellitus with other specified complication: Secondary | ICD-10-CM | POA: Diagnosis not present

## 2023-06-09 DIAGNOSIS — E7800 Pure hypercholesterolemia, unspecified: Secondary | ICD-10-CM | POA: Diagnosis not present

## 2023-06-09 DIAGNOSIS — Z0000 Encounter for general adult medical examination without abnormal findings: Secondary | ICD-10-CM | POA: Diagnosis not present

## 2023-06-09 DIAGNOSIS — K219 Gastro-esophageal reflux disease without esophagitis: Secondary | ICD-10-CM | POA: Diagnosis not present

## 2023-06-09 DIAGNOSIS — I10 Essential (primary) hypertension: Secondary | ICD-10-CM | POA: Diagnosis not present

## 2023-06-10 ENCOUNTER — Other Ambulatory Visit (HOSPITAL_COMMUNITY): Payer: Self-pay

## 2023-06-10 MED ORDER — MOUNJARO 5 MG/0.5ML ~~LOC~~ SOAJ
5.0000 mg | SUBCUTANEOUS | 0 refills | Status: DC
  Filled 2023-06-10: qty 2, 28d supply, fill #0
  Filled 2023-07-07: qty 2, 28d supply, fill #1
  Filled 2023-07-28 – 2023-07-30 (×2): qty 2, 28d supply, fill #2

## 2023-06-10 MED ORDER — ATORVASTATIN CALCIUM 80 MG PO TABS
80.0000 mg | ORAL_TABLET | Freq: Every day | ORAL | 0 refills | Status: AC
  Filled 2023-06-10: qty 90, 90d supply, fill #0

## 2023-06-12 ENCOUNTER — Other Ambulatory Visit (HOSPITAL_COMMUNITY): Payer: Self-pay

## 2023-06-13 ENCOUNTER — Other Ambulatory Visit (HOSPITAL_COMMUNITY): Payer: Self-pay

## 2023-07-07 ENCOUNTER — Other Ambulatory Visit (HOSPITAL_COMMUNITY): Payer: Self-pay

## 2023-07-25 ENCOUNTER — Other Ambulatory Visit (HOSPITAL_COMMUNITY): Payer: Self-pay

## 2023-07-25 MED ORDER — METFORMIN HCL 1000 MG PO TABS
1000.0000 mg | ORAL_TABLET | Freq: Two times a day (BID) | ORAL | 1 refills | Status: DC
  Filled 2023-07-25: qty 180, 90d supply, fill #0
  Filled 2023-11-13 – 2024-02-16 (×3): qty 180, 90d supply, fill #1
  Filled ????-??-??: fill #1

## 2023-07-25 MED ORDER — LISINOPRIL 10 MG PO TABS
10.0000 mg | ORAL_TABLET | Freq: Every day | ORAL | 0 refills | Status: DC
  Filled 2023-07-25: qty 90, 90d supply, fill #0

## 2023-07-28 ENCOUNTER — Other Ambulatory Visit (HOSPITAL_COMMUNITY): Payer: Self-pay

## 2023-07-30 ENCOUNTER — Other Ambulatory Visit (HOSPITAL_COMMUNITY): Payer: Self-pay

## 2023-07-30 MED ORDER — JARDIANCE 25 MG PO TABS
25.0000 mg | ORAL_TABLET | Freq: Every day | ORAL | 0 refills | Status: DC
Start: 2023-07-30 — End: 2023-11-13
  Filled 2023-07-30: qty 90, 90d supply, fill #0

## 2023-08-29 ENCOUNTER — Other Ambulatory Visit (HOSPITAL_COMMUNITY): Payer: Self-pay

## 2023-08-29 MED ORDER — FREESTYLE LITE TEST VI STRP
ORAL_STRIP | 3 refills | Status: AC
Start: 2023-08-29 — End: ?
  Filled 2023-08-29: qty 200, 90d supply, fill #0
  Filled 2024-02-16: qty 200, 90d supply, fill #1

## 2023-08-29 MED ORDER — ATORVASTATIN CALCIUM 80 MG PO TABS
80.0000 mg | ORAL_TABLET | Freq: Every day | ORAL | 0 refills | Status: DC
  Filled 2023-08-29: qty 90, 90d supply, fill #0

## 2023-09-05 ENCOUNTER — Other Ambulatory Visit (HOSPITAL_COMMUNITY): Payer: Self-pay

## 2023-09-05 DIAGNOSIS — F17200 Nicotine dependence, unspecified, uncomplicated: Secondary | ICD-10-CM | POA: Diagnosis not present

## 2023-09-05 DIAGNOSIS — E559 Vitamin D deficiency, unspecified: Secondary | ICD-10-CM | POA: Diagnosis not present

## 2023-09-05 DIAGNOSIS — I10 Essential (primary) hypertension: Secondary | ICD-10-CM | POA: Diagnosis not present

## 2023-09-05 DIAGNOSIS — E1169 Type 2 diabetes mellitus with other specified complication: Secondary | ICD-10-CM | POA: Diagnosis not present

## 2023-09-05 DIAGNOSIS — E7800 Pure hypercholesterolemia, unspecified: Secondary | ICD-10-CM | POA: Diagnosis not present

## 2023-09-05 DIAGNOSIS — K219 Gastro-esophageal reflux disease without esophagitis: Secondary | ICD-10-CM | POA: Diagnosis not present

## 2023-09-05 MED ORDER — MOUNJARO 5 MG/0.5ML ~~LOC~~ SOAJ
5.0000 mg | SUBCUTANEOUS | 3 refills | Status: AC
Start: 2023-09-05 — End: ?
  Filled 2023-09-05: qty 6, 84d supply, fill #0
  Filled 2023-11-13 – 2023-11-14 (×2): qty 6, 84d supply, fill #1
  Filled 2024-02-10: qty 6, 84d supply, fill #2
  Filled 2024-04-27: qty 6, 84d supply, fill #3

## 2023-11-13 ENCOUNTER — Other Ambulatory Visit (HOSPITAL_COMMUNITY): Payer: Self-pay

## 2023-11-13 MED ORDER — LISINOPRIL 10 MG PO TABS
10.0000 mg | ORAL_TABLET | Freq: Every day | ORAL | 0 refills | Status: DC
  Filled 2023-11-13: qty 90, 90d supply, fill #0

## 2023-11-13 MED ORDER — JARDIANCE 25 MG PO TABS
25.0000 mg | ORAL_TABLET | Freq: Every day | ORAL | 0 refills | Status: DC
  Filled 2023-11-13: qty 90, 90d supply, fill #0

## 2023-11-14 ENCOUNTER — Other Ambulatory Visit (HOSPITAL_COMMUNITY): Payer: Self-pay

## 2023-12-08 DIAGNOSIS — I10 Essential (primary) hypertension: Secondary | ICD-10-CM | POA: Diagnosis not present

## 2023-12-08 DIAGNOSIS — K219 Gastro-esophageal reflux disease without esophagitis: Secondary | ICD-10-CM | POA: Diagnosis not present

## 2023-12-08 DIAGNOSIS — F17210 Nicotine dependence, cigarettes, uncomplicated: Secondary | ICD-10-CM | POA: Diagnosis not present

## 2023-12-08 DIAGNOSIS — E7800 Pure hypercholesterolemia, unspecified: Secondary | ICD-10-CM | POA: Diagnosis not present

## 2023-12-08 DIAGNOSIS — E1169 Type 2 diabetes mellitus with other specified complication: Secondary | ICD-10-CM | POA: Diagnosis not present

## 2023-12-10 ENCOUNTER — Other Ambulatory Visit (HOSPITAL_COMMUNITY): Payer: Self-pay

## 2023-12-10 MED ORDER — ATORVASTATIN CALCIUM 80 MG PO TABS
80.0000 mg | ORAL_TABLET | Freq: Every day | ORAL | 1 refills | Status: AC
  Filled 2023-12-10: qty 90, 90d supply, fill #0
  Filled 2024-04-07 – 2024-05-14 (×2): qty 90, 90d supply, fill #1

## 2023-12-11 ENCOUNTER — Other Ambulatory Visit (HOSPITAL_COMMUNITY): Payer: Self-pay

## 2024-02-10 ENCOUNTER — Other Ambulatory Visit (HOSPITAL_COMMUNITY): Payer: Self-pay

## 2024-02-16 ENCOUNTER — Other Ambulatory Visit (HOSPITAL_COMMUNITY): Payer: Self-pay

## 2024-02-16 ENCOUNTER — Other Ambulatory Visit: Payer: Self-pay

## 2024-02-16 MED ORDER — LISINOPRIL 10 MG PO TABS
10.0000 mg | ORAL_TABLET | Freq: Every day | ORAL | 1 refills | Status: AC
  Filled 2024-02-16: qty 90, 90d supply, fill #0
  Filled 2024-05-14: qty 90, 90d supply, fill #1

## 2024-04-01 ENCOUNTER — Other Ambulatory Visit (HOSPITAL_BASED_OUTPATIENT_CLINIC_OR_DEPARTMENT_OTHER): Payer: Self-pay | Admitting: Obstetrics and Gynecology

## 2024-04-01 DIAGNOSIS — E2839 Other primary ovarian failure: Secondary | ICD-10-CM

## 2024-04-01 DIAGNOSIS — Z01419 Encounter for gynecological examination (general) (routine) without abnormal findings: Secondary | ICD-10-CM | POA: Diagnosis not present

## 2024-04-07 ENCOUNTER — Other Ambulatory Visit (HOSPITAL_COMMUNITY): Payer: Self-pay

## 2024-04-07 ENCOUNTER — Other Ambulatory Visit: Payer: Self-pay

## 2024-04-07 MED ORDER — JARDIANCE 25 MG PO TABS
25.0000 mg | ORAL_TABLET | Freq: Every day | ORAL | 0 refills | Status: AC
  Filled 2024-04-07 (×2): qty 90, 90d supply, fill #0

## 2024-04-12 ENCOUNTER — Encounter: Payer: Self-pay | Admitting: Radiology

## 2024-04-23 DIAGNOSIS — Z1231 Encounter for screening mammogram for malignant neoplasm of breast: Secondary | ICD-10-CM | POA: Diagnosis not present

## 2024-04-27 ENCOUNTER — Other Ambulatory Visit: Payer: Self-pay | Admitting: Obstetrics and Gynecology

## 2024-04-27 ENCOUNTER — Other Ambulatory Visit (HOSPITAL_COMMUNITY): Payer: Self-pay

## 2024-04-27 DIAGNOSIS — R928 Other abnormal and inconclusive findings on diagnostic imaging of breast: Secondary | ICD-10-CM

## 2024-05-10 ENCOUNTER — Ambulatory Visit
Admission: RE | Admit: 2024-05-10 | Discharge: 2024-05-10 | Disposition: A | Source: Ambulatory Visit | Attending: Obstetrics and Gynecology | Admitting: Obstetrics and Gynecology

## 2024-05-10 DIAGNOSIS — R928 Other abnormal and inconclusive findings on diagnostic imaging of breast: Secondary | ICD-10-CM

## 2024-05-14 ENCOUNTER — Other Ambulatory Visit (HOSPITAL_COMMUNITY): Payer: Self-pay

## 2024-05-14 MED ORDER — METFORMIN HCL 1000 MG PO TABS
1000.0000 mg | ORAL_TABLET | Freq: Two times a day (BID) | ORAL | 0 refills | Status: AC
  Filled 2024-05-14: qty 180, 90d supply, fill #0

## 2024-05-20 ENCOUNTER — Other Ambulatory Visit (HOSPITAL_COMMUNITY): Payer: Self-pay

## 2024-06-07 DIAGNOSIS — E1169 Type 2 diabetes mellitus with other specified complication: Secondary | ICD-10-CM | POA: Diagnosis not present

## 2024-06-07 DIAGNOSIS — E7800 Pure hypercholesterolemia, unspecified: Secondary | ICD-10-CM | POA: Diagnosis not present

## 2024-06-07 DIAGNOSIS — K219 Gastro-esophageal reflux disease without esophagitis: Secondary | ICD-10-CM | POA: Diagnosis not present

## 2024-06-07 DIAGNOSIS — I10 Essential (primary) hypertension: Secondary | ICD-10-CM | POA: Diagnosis not present

## 2024-06-07 DIAGNOSIS — Z23 Encounter for immunization: Secondary | ICD-10-CM | POA: Diagnosis not present

## 2024-06-07 DIAGNOSIS — F17210 Nicotine dependence, cigarettes, uncomplicated: Secondary | ICD-10-CM | POA: Diagnosis not present

## 2024-06-07 DIAGNOSIS — Z0000 Encounter for general adult medical examination without abnormal findings: Secondary | ICD-10-CM | POA: Diagnosis not present

## 2024-06-07 DIAGNOSIS — L659 Nonscarring hair loss, unspecified: Secondary | ICD-10-CM | POA: Diagnosis not present
